# Patient Record
Sex: Female | Born: 1980
Health system: Southern US, Community
[De-identification: ages and names within clinical notes are randomized; demographics above are authoritative.]

## PROBLEM LIST (undated history)

## (undated) ENCOUNTER — Inpatient Hospital Stay (HOSPITAL_COMMUNITY): Payer: Self-pay

## (undated) DIAGNOSIS — K2970 Gastritis, unspecified, without bleeding: Secondary | ICD-10-CM

## (undated) DIAGNOSIS — B9681 Helicobacter pylori [H. pylori] as the cause of diseases classified elsewhere: Secondary | ICD-10-CM

## (undated) DIAGNOSIS — E079 Disorder of thyroid, unspecified: Secondary | ICD-10-CM

## (undated) DIAGNOSIS — F1020 Alcohol dependence, uncomplicated: Secondary | ICD-10-CM

## (undated) DIAGNOSIS — D649 Anemia, unspecified: Secondary | ICD-10-CM

## (undated) DIAGNOSIS — I10 Essential (primary) hypertension: Secondary | ICD-10-CM

## (undated) DIAGNOSIS — H5440 Blindness, one eye, unspecified eye: Secondary | ICD-10-CM

## (undated) DIAGNOSIS — D219 Benign neoplasm of connective and other soft tissue, unspecified: Secondary | ICD-10-CM

## (undated) DIAGNOSIS — F419 Anxiety disorder, unspecified: Secondary | ICD-10-CM

## (undated) DIAGNOSIS — H3320 Serous retinal detachment, unspecified eye: Secondary | ICD-10-CM

## (undated) DIAGNOSIS — F32A Depression, unspecified: Secondary | ICD-10-CM

## (undated) HISTORY — DX: Depression, unspecified: F32.A

## (undated) HISTORY — DX: Anxiety disorder, unspecified: F41.9

## (undated) HISTORY — DX: Alcohol dependence, uncomplicated: F10.20

## (undated) HISTORY — PX: DILATION AND CURETTAGE OF UTERUS: SHX78

## (undated) HISTORY — DX: Blindness, one eye, unspecified eye: H54.40

## (undated) HISTORY — PX: EYE SURGERY: SHX253

## (undated) HISTORY — DX: Helicobacter pylori (H. pylori) as the cause of diseases classified elsewhere: K29.70

## (undated) HISTORY — DX: Helicobacter pylori (H. pylori) as the cause of diseases classified elsewhere: B96.81

---

## 2005-04-18 ENCOUNTER — Emergency Department (HOSPITAL_COMMUNITY): Admission: EM | Admit: 2005-04-18 | Discharge: 2005-04-18 | Payer: Self-pay | Admitting: Emergency Medicine

## 2005-06-11 ENCOUNTER — Inpatient Hospital Stay (HOSPITAL_COMMUNITY): Admission: AD | Admit: 2005-06-11 | Discharge: 2005-06-11 | Payer: Self-pay | Admitting: Obstetrics and Gynecology

## 2005-10-15 ENCOUNTER — Inpatient Hospital Stay (HOSPITAL_COMMUNITY): Admission: AD | Admit: 2005-10-15 | Discharge: 2005-10-15 | Payer: Self-pay | Admitting: Obstetrics and Gynecology

## 2005-10-20 ENCOUNTER — Encounter (INDEPENDENT_AMBULATORY_CARE_PROVIDER_SITE_OTHER): Payer: Self-pay | Admitting: Specialist

## 2005-10-20 ENCOUNTER — Inpatient Hospital Stay (HOSPITAL_COMMUNITY): Admission: AD | Admit: 2005-10-20 | Discharge: 2005-10-22 | Payer: Self-pay | Admitting: Obstetrics and Gynecology

## 2007-01-29 ENCOUNTER — Emergency Department (HOSPITAL_COMMUNITY): Admission: EM | Admit: 2007-01-29 | Discharge: 2007-01-29 | Payer: Self-pay | Admitting: Family Medicine

## 2007-03-31 ENCOUNTER — Emergency Department (HOSPITAL_COMMUNITY): Admission: EM | Admit: 2007-03-31 | Discharge: 2007-03-31 | Payer: Self-pay | Admitting: Emergency Medicine

## 2007-04-05 ENCOUNTER — Emergency Department (HOSPITAL_COMMUNITY): Admission: EM | Admit: 2007-04-05 | Discharge: 2007-04-05 | Payer: Self-pay | Admitting: Emergency Medicine

## 2007-07-20 ENCOUNTER — Emergency Department (HOSPITAL_COMMUNITY): Admission: EM | Admit: 2007-07-20 | Discharge: 2007-07-20 | Payer: Self-pay | Admitting: Emergency Medicine

## 2008-05-12 ENCOUNTER — Emergency Department (HOSPITAL_COMMUNITY): Admission: EM | Admit: 2008-05-12 | Discharge: 2008-05-12 | Payer: Self-pay | Admitting: Emergency Medicine

## 2008-05-15 ENCOUNTER — Emergency Department (HOSPITAL_COMMUNITY): Admission: EM | Admit: 2008-05-15 | Discharge: 2008-05-15 | Payer: Self-pay | Admitting: Emergency Medicine

## 2008-08-24 ENCOUNTER — Emergency Department (HOSPITAL_COMMUNITY): Admission: EM | Admit: 2008-08-24 | Discharge: 2008-08-24 | Payer: Self-pay | Admitting: Emergency Medicine

## 2009-04-03 LAB — SICKLE CELL SCREEN: Sickle Cell Screen: NORMAL

## 2009-05-05 ENCOUNTER — Emergency Department (HOSPITAL_COMMUNITY): Admission: EM | Admit: 2009-05-05 | Discharge: 2009-05-05 | Payer: Self-pay | Admitting: Emergency Medicine

## 2009-08-21 ENCOUNTER — Emergency Department (HOSPITAL_COMMUNITY): Admission: EM | Admit: 2009-08-21 | Discharge: 2009-08-21 | Payer: Self-pay | Admitting: Emergency Medicine

## 2010-07-17 ENCOUNTER — Ambulatory Visit: Payer: Self-pay | Admitting: Obstetrics and Gynecology

## 2010-07-17 ENCOUNTER — Encounter (INDEPENDENT_AMBULATORY_CARE_PROVIDER_SITE_OTHER): Payer: Self-pay | Admitting: *Deleted

## 2010-07-18 ENCOUNTER — Encounter (INDEPENDENT_AMBULATORY_CARE_PROVIDER_SITE_OTHER): Payer: Self-pay | Admitting: *Deleted

## 2010-07-18 LAB — CONVERTED CEMR LAB

## 2010-07-23 ENCOUNTER — Ambulatory Visit: Payer: Self-pay | Admitting: Obstetrics and Gynecology

## 2010-10-05 ENCOUNTER — Encounter: Payer: Self-pay | Admitting: *Deleted

## 2010-11-25 ENCOUNTER — Inpatient Hospital Stay (INDEPENDENT_AMBULATORY_CARE_PROVIDER_SITE_OTHER)
Admission: RE | Admit: 2010-11-25 | Discharge: 2010-11-25 | Disposition: A | Discharge status: Home / Self Care | Payer: Self-pay | Source: Ambulatory Visit | Attending: Emergency Medicine | Admitting: Emergency Medicine

## 2010-11-25 DIAGNOSIS — A6004 Herpesviral vulvovaginitis: Secondary | ICD-10-CM

## 2010-12-17 LAB — BASIC METABOLIC PANEL
BUN: 10 mg/dL (ref 6–23)
CO2: 24 mEq/L (ref 19–32)
Chloride: 107 mEq/L (ref 96–112)
Glucose, Bld: 102 mg/dL — ABNORMAL HIGH (ref 70–99)
Potassium: 4.2 mEq/L (ref 3.5–5.1)
Sodium: 139 mEq/L (ref 135–145)

## 2010-12-17 LAB — RAPID URINE DRUG SCREEN, HOSP PERFORMED
Barbiturates: NOT DETECTED
Cocaine: NOT DETECTED
Opiates: NOT DETECTED
Tetrahydrocannabinol: POSITIVE — AB

## 2010-12-17 LAB — URINALYSIS, ROUTINE W REFLEX MICROSCOPIC
Glucose, UA: NEGATIVE mg/dL
Ketones, ur: NEGATIVE mg/dL
Specific Gravity, Urine: 1.022 (ref 1.005–1.030)
pH: 6.5 (ref 5.0–8.0)

## 2010-12-17 LAB — CBC
HCT: 38.1 % (ref 36.0–46.0)
Hemoglobin: 12.7 g/dL (ref 12.0–15.0)
MCHC: 33.3 g/dL (ref 30.0–36.0)
MCV: 85 fL (ref 78.0–100.0)
Platelets: 234 10*3/uL (ref 150–400)
RDW: 17.8 % — ABNORMAL HIGH (ref 11.5–15.5)

## 2010-12-17 LAB — DIFFERENTIAL
Basophils Absolute: 0 10*3/uL (ref 0.0–0.1)
Eosinophils Absolute: 0.1 10*3/uL (ref 0.0–0.7)
Eosinophils Relative: 2 % (ref 0–5)
Monocytes Absolute: 0.6 10*3/uL (ref 0.1–1.0)

## 2010-12-17 LAB — PREGNANCY, URINE: Preg Test, Ur: NEGATIVE
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
## 2011-02-18 ENCOUNTER — Emergency Department (HOSPITAL_COMMUNITY)
Admission: EM | Admit: 2011-02-18 | Discharge: 2011-02-19 | Disposition: A | Discharge status: Home / Self Care | Payer: Medicaid Other | Attending: Emergency Medicine | Admitting: Emergency Medicine

## 2011-02-18 ENCOUNTER — Emergency Department (HOSPITAL_COMMUNITY): Payer: Medicaid Other

## 2011-02-18 DIAGNOSIS — S62639B Displaced fracture of distal phalanx of unspecified finger, initial encounter for open fracture: Secondary | ICD-10-CM | POA: Insufficient documentation

## 2011-02-18 DIAGNOSIS — S61209A Unspecified open wound of unspecified finger without damage to nail, initial encounter: Secondary | ICD-10-CM | POA: Insufficient documentation

## 2011-02-18 DIAGNOSIS — W230XXA Caught, crushed, jammed, or pinched between moving objects, initial encounter: Secondary | ICD-10-CM | POA: Insufficient documentation

## 2011-03-03 NOTE — Op Note (Signed)
Susan, Walton NO.:  [id number]  MEDICAL RECORD NO.:  [id number]  LOCATION:  [facility]                         FACILITY:  [facility]  PHYSICIAN:  Dionne Ano. Galia Rahm, M.D.DATE OF BIRTH:  [birth year]  DATE OF PROCEDURE:  02/18/2011 DATE OF DISCHARGE:  02/19/2011                              OPERATIVE REPORT   HISTORY OF PRESENT ILLNESS:  Susan Walton presents to the emergency room.  I was asked to see her by the emergency room staff and take over her care due to a left thumb amputation.  The patient was at home tonight, shut her thumb accidently in a door.  This was a heavy screen door with metal impregnated into it.  She subsequently sustained a fracture and near amputation.  She is alert and oriented and pleasant.  PAST MEDICAL HISTORY:  Reviewed.  PAST SURGICAL HISTORY:  Reviewed.  She has no significant past medical or surgical problems.  She describes hypertension, but is not taking any medicines for this.  ALLERGIES:  ASPIRIN.  MEDICATIONS:  No current medicines that she takes on a regular basis.  SOCIAL HISTORY:  She occasionally enjoys an alcoholic beverage.  She is a mother of three and stays at home with her children.  FAMILY HISTORY:  Noncontributory.  REVIEW OF SYSTEMS:  Negative for fever, chills, nausea, vomiting, and malaise.  PHYSICAL EXAMINATION:  GENERAL:  Pleasant female, alert and not in any acute distress. VITAL SIGNS:  Stable. NECK/BACK:  Nontender. CHEST:  Clear. HEENT:  Within normal limits. EXTREMITIES:  The patient has a normal right upper extremity examination.  Lower extremity examination is benign.  Left upper extremity shows near amputation to the left thumb.  Index through small fingers looked normal.  Elbow and forearm are nontender.  She has no evidence of infection, dystrophy, or vascular compromise but certainly has findings of the near amputation to the thumb with associated fracture.  IMAGING:  The x-rays show a  fracture, comminuted in nature about the distal phalanx of thumb.  IMPRESSION:  Distal phalanx fracture with near amputation to the thumb.  PLAN:  I consented her verbally for I and D, repair of structures if necessary.  PROCEDURE IN DETAIL:  She was taken to the procedure suite and underwent intermetacarpal block performed by myself sterilely.  Following this, underwent I and D of skin, subcutaneous tissue, bone, tendinous tissue, and periosteal tissue.  Following the excisional debridement with orthopedic curettage instruments as well as knife blade and scissors, the patient then underwent copious irrigation followed by open treatment of the distal phalanx fracture.  Following this, the proximal portion of her hard nail and acrylic portion of her nail was removed to allow for repair.  Once this was done, I then performed a complex reattachment with volar flap.  This was done with combination of 6-0 an 4-0 chromic suture under 4.0 loupe magnification.  I was able to reattach the tip nicely and she had excellent refill, no complicating features.  Following this, the patient was irrigated copiously.  Adaptic was placed under the eponychial fold and I discussed with her the postoperative care plan.  Thus, the patient underwent I and D of skin,  subcutaneous tissue, muscle, tendon, open treatment of distal phalanx fracture, nail plate removal, complex nail bed repair and volar flap advancement.  She was discharged on Keflex 500 mg 1 p.o. q.i.d. x14 days.  I have encouraged her to take vitamin C 1000 mg day, over-the-counter stool softener to prevent constipation, and Percocet for pain 1-2 q.4-6 h. p.r.n. pain p.o., dispensed #45, 5 mg variety.  She will notify me should any problems occur.  We will see her back in 9-10 days in the office.  Dressing change, stack splint versus volar splint application, at 4 weeks we will begin range of motion, at 6-8 weeks sponge ball use. This is  going to take her up to 2-3 months to completely heal this and it will take her about 4-6 months until she completely grows a new nail to her satisfaction in my estimation.  She had some degree of nail ridging, however, she is certainly looking very good compared to where we started tonight.  It was a pleasure seeing her today.  We wish her best in the future and look forward to see her back in the office.     Dionne Ano. Amanda Pea, M.D.     [facility]  D:  02/18/2011  T:  02/19/2011  Job:  [id number]  Electronically Signed by Dominica Severin M.D. on 03/03/2011 06:33:10 AM

## 2011-05-16 ENCOUNTER — Emergency Department (HOSPITAL_COMMUNITY)
Admission: EM | Admit: 2011-05-16 | Discharge: 2011-05-16 | Disposition: A | Discharge status: Home / Self Care | Payer: Medicaid Other | Attending: Emergency Medicine | Admitting: Emergency Medicine

## 2011-05-16 ENCOUNTER — Emergency Department (HOSPITAL_COMMUNITY): Payer: Medicaid Other

## 2011-05-16 DIAGNOSIS — N949 Unspecified condition associated with female genital organs and menstrual cycle: Secondary | ICD-10-CM | POA: Insufficient documentation

## 2011-05-16 DIAGNOSIS — R220 Localized swelling, mass and lump, head: Secondary | ICD-10-CM | POA: Insufficient documentation

## 2011-05-16 DIAGNOSIS — O2390 Unspecified genitourinary tract infection in pregnancy, unspecified trimester: Secondary | ICD-10-CM | POA: Insufficient documentation

## 2011-05-16 DIAGNOSIS — R221 Localized swelling, mass and lump, neck: Secondary | ICD-10-CM | POA: Insufficient documentation

## 2011-05-16 DIAGNOSIS — A5901 Trichomonal vulvovaginitis: Secondary | ICD-10-CM | POA: Insufficient documentation

## 2011-05-16 DIAGNOSIS — O4690 Antepartum hemorrhage, unspecified, unspecified trimester: Secondary | ICD-10-CM | POA: Insufficient documentation

## 2011-05-16 DIAGNOSIS — B009 Herpesviral infection, unspecified: Secondary | ICD-10-CM | POA: Insufficient documentation

## 2011-05-16 DIAGNOSIS — R51 Headache: Secondary | ICD-10-CM | POA: Insufficient documentation

## 2011-05-16 LAB — WET PREP, GENITAL: Clue Cells Wet Prep HPF POC: NONE SEEN

## 2011-05-16 LAB — URINALYSIS, ROUTINE W REFLEX MICROSCOPIC
Ketones, ur: >80 mg/dL — AB
Nitrite: NEGATIVE
Protein, ur: 30 mg/dL — AB
Urobilinogen, UA: 1 mg/dL (ref 0.0–1.0)

## 2011-05-16 LAB — DIFFERENTIAL
Basophils Absolute: 0.1 10*3/uL (ref 0.0–0.1)
Basophils Relative: 1 % (ref 0–1)
Eosinophils Absolute: 0.2 10*3/uL (ref 0.0–0.7)
Monocytes Absolute: 0.5 10*3/uL (ref 0.1–1.0)
Monocytes Relative: 9 % (ref 3–12)
Neutro Abs: 2.9 10*3/uL (ref 1.7–7.7)
Neutrophils Relative %: 54 % (ref 43–77)

## 2011-05-16 LAB — POCT I-STAT, CHEM 8
BUN: 11 mg/dL (ref 6–23)
Calcium, Ion: 1.13 mmol/L (ref 1.12–1.32)
Hemoglobin: 13.3 g/dL (ref 12.0–15.0)
Sodium: 137 mEq/L (ref 135–145)
TCO2: 20 mmol/L (ref 0–100)

## 2011-05-16 LAB — CBC
Hemoglobin: 12 g/dL (ref 12.0–15.0)
MCH: 28.3 pg (ref 26.0–34.0)
MCHC: 33.9 g/dL (ref 30.0–36.0)
Platelets: 240 10*3/uL (ref 150–400)
RBC: 4.24 MIL/uL (ref 3.87–5.11)

## 2011-05-16 LAB — ABO/RH: ABO/RH(D): B POS

## 2011-05-16 LAB — HCG, QUANTITATIVE, PREGNANCY: hCG, Beta Chain, Quant, S: 5988 m[IU]/mL — ABNORMAL HIGH (ref <5)

## 2011-05-16 LAB — RPR: RPR Ser Ql: NONREACTIVE

## 2011-05-17 ENCOUNTER — Inpatient Hospital Stay (HOSPITAL_COMMUNITY)
Admission: AD | Admit: 2011-05-17 | Discharge: 2011-05-17 | Disposition: A | Discharge status: Home / Self Care | Payer: Medicaid Other | Source: Ambulatory Visit | Attending: Obstetrics & Gynecology | Admitting: Obstetrics & Gynecology

## 2011-05-17 ENCOUNTER — Encounter (HOSPITAL_COMMUNITY): Payer: Self-pay

## 2011-05-17 DIAGNOSIS — O169 Unspecified maternal hypertension, unspecified trimester: Secondary | ICD-10-CM | POA: Insufficient documentation

## 2011-05-17 DIAGNOSIS — O9989 Other specified diseases and conditions complicating pregnancy, childbirth and the puerperium: Secondary | ICD-10-CM | POA: Insufficient documentation

## 2011-05-17 DIAGNOSIS — M549 Dorsalgia, unspecified: Secondary | ICD-10-CM | POA: Insufficient documentation

## 2011-05-17 DIAGNOSIS — O200 Threatened abortion: Secondary | ICD-10-CM

## 2011-05-17 DIAGNOSIS — M899 Disorder of bone, unspecified: Secondary | ICD-10-CM | POA: Insufficient documentation

## 2011-05-17 DIAGNOSIS — R109 Unspecified abdominal pain: Secondary | ICD-10-CM | POA: Insufficient documentation

## 2011-05-17 DIAGNOSIS — M259 Joint disorder, unspecified: Secondary | ICD-10-CM | POA: Insufficient documentation

## 2011-05-17 HISTORY — DX: Essential (primary) hypertension: I10

## 2011-05-17 LAB — GC/CHLAMYDIA PROBE AMP, GENITAL
Chlamydia, DNA Probe: NEGATIVE
GC Probe Amp, Genital: NEGATIVE

## 2011-05-17 NOTE — Progress Notes (Signed)
Notified Child psychotherapist . Will come see pt in MAU
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
## 2011-05-17 NOTE — ED Notes (Signed)
Social worker at bedside to eval pt.

## 2011-05-17 NOTE — Progress Notes (Signed)
Was seen at [facility] ED yesterday was told her uterus was abnormal on ultrasound having intermittent abdominal pain, was having light spotting one time, none today

## 2011-05-17 NOTE — ED Provider Notes (Signed)
Attestation of Attending Supervision of Advanced Practitioner: Evaluation and management procedures were performed by the PA/NP/CNM/OB Fellow under my supervision/collaboration. Chart reviewed and agree with management and plan.  ANYANWU,UGONNA A 05/17/2011 11:58 AM

## 2011-05-17 NOTE — ED Notes (Signed)
Social worker trying to arrange bus fare out of town for pt and 2 children to stay with her mother.
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
## 2011-05-21 ENCOUNTER — Other Ambulatory Visit: Payer: Self-pay | Admitting: Obstetrics & Gynecology

## 2011-05-21 ENCOUNTER — Inpatient Hospital Stay (HOSPITAL_COMMUNITY)
Admission: AD | Admit: 2011-05-21 | Discharge: 2011-05-21 | Disposition: A | Discharge status: Home / Self Care | Payer: Medicaid Other | Source: Ambulatory Visit | Attending: Obstetrics & Gynecology | Admitting: Obstetrics & Gynecology

## 2011-05-21 ENCOUNTER — Inpatient Hospital Stay (HOSPITAL_COMMUNITY): Payer: Medicaid Other

## 2011-05-21 DIAGNOSIS — O10019 Pre-existing essential hypertension complicating pregnancy, unspecified trimester: Secondary | ICD-10-CM | POA: Insufficient documentation

## 2011-05-21 DIAGNOSIS — O98319 Other infections with a predominantly sexual mode of transmission complicating pregnancy, unspecified trimester: Secondary | ICD-10-CM | POA: Insufficient documentation

## 2011-05-21 DIAGNOSIS — O039 Complete or unspecified spontaneous abortion without complication: Secondary | ICD-10-CM

## 2011-05-21 DIAGNOSIS — N771 Vaginitis, vulvitis and vulvovaginitis in diseases classified elsewhere: Secondary | ICD-10-CM | POA: Insufficient documentation

## 2011-05-21 DIAGNOSIS — A5609 Other chlamydial infection of lower genitourinary tract: Secondary | ICD-10-CM | POA: Insufficient documentation

## 2011-05-21 DIAGNOSIS — A5901 Trichomonal vulvovaginitis: Secondary | ICD-10-CM

## 2011-05-21 LAB — CBC
HCT: 34.5 % — ABNORMAL LOW (ref 36.0–46.0)
Hemoglobin: 11.4 g/dL — ABNORMAL LOW (ref 12.0–15.0)
RBC: 4.03 MIL/uL (ref 3.87–5.11)

## 2011-05-21 MED ORDER — METRONIDAZOLE 500 MG PO TABS: 500.0000 mg | ORAL_TABLET | Freq: Two times a day (BID) | ORAL | Status: AC

## 2011-05-21 NOTE — Progress Notes (Signed)
Pt states she had heavy bleeding that started at 1700-was passing clots and states she soaked 3 pads in 2 hours

## 2011-05-21 NOTE — ED Provider Notes (Signed)
History     Chief Complaint  Patient presents with  . Vaginal Bleeding   The history is provided by the patient.  Susan Walton y.o.  Presents with bleeding in a known pregnancy.  LMP 6/18.  Presented to [facility] 8/31 for spotting and cramping.  U/S told the "uterus was shaped differently".   Per report the gestational sac was elongated measuring [redacted]w[redacted]d without cardiac activity. Suspicious for anembryonic pregnancy.    BHCG that date was 5988, blood type B+.  Planned pregnancy.  Today she reports at 1700 she had lower abdominal and lower back pain and passed 1 blood clot with heavy bleeding and 45 minutes later passed another clot.  Since that time less cramping now "fading away" and bleeding is less.  States she has high blood pressure and hasn';t been taking her medication.  States she was dx and given Rx for Trich at [facility] but lost prescription and would like another Rx.  No longer with that partner.  Voices no other concerns.      Past Medical History  Diagnosis Date  . Hypertension     Past Surgical History  Procedure Date  . No past surgeries     No family history on file.  History  Substance Use Topics  . Smoking status: Not on file  . Smokeless tobacco: Not on file  . Alcohol Use: Not on file    Allergies:  Allergies  Allergen Reactions  . Asa Buff (Mag (Aspirin Buffered) Hives and Swelling    Prescriptions prior to admission  Medication Sig Dispense Refill  . hydrochlorothiazide 25 MG tablet Take 25 mg by mouth daily.        Marland Kitchen PRESCRIPTION MEDICATION Take 1 tablet by mouth daily. Pt is prescribed a med for hypertension but she has not taken in over 4 months.         Review of Systems  Constitutional: Negative for fever and chills.  Respiratory: Negative.   Gastrointestinal: Positive for abdominal pain. Negative for nausea and vomiting.  Genitourinary: Negative.        Vaginal bleeding and lower abdominal cramping.   Physical Exam   Blood pressure 131/83, pulse 74,  temperature 98.3 F (36.8 C), temperature source Oral, resp. rate 20, height 5\' 6"  (1.676 Walton), weight 170 lb (77.111 kg), last menstrual period 03/03/2011.  Physical Exam  Constitutional: She is oriented to person, place, and time. She appears well-developed and well-nourished.  HENT:  Head: Normocephalic.  Neck: Normal range of motion.  Respiratory: Effort normal.  GI: Soft.  Genitourinary: Uterus is enlarged and tender. Right adnexum displays no tenderness. Left adnexum displays no tenderness. There is bleeding (moderate amount of bleeding with mulitple clots.  Large gestational sac lying in the vaginal canal. Easily removed. l) around the vagina.  Neurological: She is alert and oriented to person, place, and time.  Skin: Skin is warm and dry.    MAU Course  Procedures Results for orders placed during the hospital encounter of 05/21/11 (from the past 24 hour(s))  CBC     Status: Abnormal   Collection Time   05/21/11  8:54 PM      Component Value Range   WBC 7.5  4.0 - 10.5 (K/uL)   RBC 4.03  3.87 - 5.11 (MIL/uL)   Hemoglobin 11.4 (*) 12.0 - 15.0 (g/dL)   HCT 16.1 (*) 09.6 - 46.0 (%)   MCV 85.6  78.0 - 100.0 (fL)   MCH 28.3  26.0 - 34.0 (pg)  MCHC 33.0  30.0 - 36.0 (g/dL)   RDW 16.1 (*) 09.6 - 15.5 (%)   Platelets 233  150 - 400 (K/uL)   Ultrasound report:  No intrauterine gestational sac or embryo seen.  Thickened inhomogeneous endometrial contents 2.2cm in thickness.  Findings consistent with retained products of conception.  MDM Discussed findings with Dr. Despina Hidden.  POC sent to lab. Follow up in GYN in 4 weeks. Assessment and Plan  Spontaneous Abortion complete Untreated Trichomonas  Plan  Followup in the GYN CLInic in 4 weeks per Dr. Despina Hidden.  Instructed to avoid intercourse while she is bleeding and until seen in the clinic. Rx given for Flagyl. Susan Walton,Susan Walton 05/21/2011, 8:05 PM   Susan Holmes, NP 05/21/11 2250

## 2011-05-22 ENCOUNTER — Ambulatory Visit (HOSPITAL_COMMUNITY): Payer: Medicaid Other

## 2011-06-11 ENCOUNTER — Ambulatory Visit: Payer: Medicaid Other | Admitting: Audiology

## 2011-06-19 ENCOUNTER — Encounter: Payer: Medicaid Other | Admitting: Obstetrics and Gynecology

## 2011-06-20 LAB — WET PREP, GENITAL

## 2011-06-20 LAB — URINALYSIS, ROUTINE W REFLEX MICROSCOPIC
Glucose, UA: NEGATIVE mg/dL
Hgb urine dipstick: NEGATIVE
Protein, ur: NEGATIVE mg/dL
Specific Gravity, Urine: 1.034 — ABNORMAL HIGH (ref 1.005–1.030)
pH: 6 (ref 5.0–8.0)

## 2011-06-20 LAB — URINE CULTURE: Colony Count: >=100000

## 2011-06-20 LAB — GC/CHLAMYDIA PROBE AMP, GENITAL: Chlamydia, DNA Probe: NEGATIVE

## 2011-06-30 LAB — URINALYSIS, ROUTINE W REFLEX MICROSCOPIC
Bilirubin Urine: NEGATIVE
Nitrite: NEGATIVE
Specific Gravity, Urine: 1.028
Urobilinogen, UA: 0.2

## 2011-06-30 LAB — WET PREP, GENITAL
Clue Cells Wet Prep HPF POC: NONE SEEN
Yeast Wet Prep HPF POC: NONE SEEN

## 2011-06-30 LAB — RPR: RPR Ser Ql: NONREACTIVE

## 2011-06-30 LAB — POCT PREGNANCY, URINE: Operator id: 198171

## 2011-06-30 LAB — URINE MICROSCOPIC-ADD ON

## 2011-10-02 ENCOUNTER — Emergency Department (HOSPITAL_COMMUNITY)
Admission: EM | Admit: 2011-10-02 | Discharge: 2011-10-02 | Disposition: A | Discharge status: Home / Self Care | Payer: Medicaid Other | Attending: Emergency Medicine | Admitting: Emergency Medicine

## 2011-10-02 ENCOUNTER — Encounter (HOSPITAL_COMMUNITY): Payer: Self-pay

## 2011-10-02 ENCOUNTER — Emergency Department (HOSPITAL_COMMUNITY): Payer: Medicaid Other

## 2011-10-02 DIAGNOSIS — IMO0002 Reserved for concepts with insufficient information to code with codable children: Secondary | ICD-10-CM | POA: Insufficient documentation

## 2011-10-02 DIAGNOSIS — R10819 Abdominal tenderness, unspecified site: Secondary | ICD-10-CM | POA: Insufficient documentation

## 2011-10-02 DIAGNOSIS — R109 Unspecified abdominal pain: Secondary | ICD-10-CM | POA: Insufficient documentation

## 2011-10-02 DIAGNOSIS — R300 Dysuria: Secondary | ICD-10-CM | POA: Insufficient documentation

## 2011-10-02 DIAGNOSIS — N949 Unspecified condition associated with female genital organs and menstrual cycle: Secondary | ICD-10-CM | POA: Insufficient documentation

## 2011-10-02 DIAGNOSIS — N898 Other specified noninflammatory disorders of vagina: Secondary | ICD-10-CM | POA: Insufficient documentation

## 2011-10-02 DIAGNOSIS — L293 Anogenital pruritus, unspecified: Secondary | ICD-10-CM | POA: Insufficient documentation

## 2011-10-02 DIAGNOSIS — R3911 Hesitancy of micturition: Secondary | ICD-10-CM | POA: Insufficient documentation

## 2011-10-02 DIAGNOSIS — R3915 Urgency of urination: Secondary | ICD-10-CM | POA: Insufficient documentation

## 2011-10-02 DIAGNOSIS — F17200 Nicotine dependence, unspecified, uncomplicated: Secondary | ICD-10-CM | POA: Insufficient documentation

## 2011-10-02 DIAGNOSIS — R102 Pelvic and perineal pain: Secondary | ICD-10-CM

## 2011-10-02 LAB — BASIC METABOLIC PANEL
BUN: 13 mg/dL (ref 6–23)
CO2: 23 mEq/L (ref 19–32)
Calcium: 9.6 mg/dL (ref 8.4–10.5)
Chloride: 108 mEq/L (ref 96–112)
Creatinine, Ser: 0.66 mg/dL (ref 0.50–1.10)
GFR calc Af Amer: >90 mL/min (ref >90)
GFR calc non Af Amer: >90 mL/min (ref >90)
Glucose, Bld: 87 mg/dL (ref 70–99)
Potassium: 3.9 mEq/L (ref 3.5–5.1)
Sodium: 141 mEq/L (ref 135–145)

## 2011-10-02 LAB — CBC
HCT: 36.3 % (ref 36.0–46.0)
Hemoglobin: 11.7 g/dL — ABNORMAL LOW (ref 12.0–15.0)
MCH: 26.4 pg (ref 26.0–34.0)
MCHC: 32.2 g/dL (ref 30.0–36.0)
MCV: 81.8 fL (ref 78.0–100.0)
Platelets: 336 10*3/uL (ref 150–400)
RBC: 4.44 MIL/uL (ref 3.87–5.11)
RDW: 16.7 % — ABNORMAL HIGH (ref 11.5–15.5)
WBC: 7.4 10*3/uL (ref 4.0–10.5)

## 2011-10-02 LAB — DIFFERENTIAL
Basophils Absolute: 0 10*3/uL (ref 0.0–0.1)
Basophils Relative: 1 % (ref 0–1)
Eosinophils Absolute: 0.2 10*3/uL (ref 0.0–0.7)
Eosinophils Relative: 3 % (ref 0–5)
Lymphocytes Relative: 26 % (ref 12–46)
Lymphs Abs: 1.9 10*3/uL (ref 0.7–4.0)
Monocytes Absolute: 0.6 10*3/uL (ref 0.1–1.0)
Monocytes Relative: 8 % (ref 3–12)
Neutro Abs: 4.7 10*3/uL (ref 1.7–7.7)
Neutrophils Relative %: 64 % (ref 43–77)

## 2011-10-02 LAB — URINALYSIS, ROUTINE W REFLEX MICROSCOPIC
Ketones, ur: 15 mg/dL — AB
Nitrite: NEGATIVE
Protein, ur: NEGATIVE mg/dL

## 2011-10-02 LAB — URINE MICROSCOPIC-ADD ON

## 2011-10-02 LAB — WET PREP, GENITAL
Trich, Wet Prep: NONE SEEN
Yeast Wet Prep HPF POC: NONE SEEN

## 2011-10-02 MED ORDER — HYDROCODONE-ACETAMINOPHEN 5-325 MG PO TABS: 1.0000 | ORAL_TABLET | Freq: Four times a day (QID) | ORAL | Status: AC | PRN

## 2011-10-02 MED ORDER — ONDANSETRON HCL 4 MG/2ML IJ SOLN
4.0000 mg | Freq: Once | INTRAMUSCULAR | Status: AC
  Administered 2011-10-02: 4 mg via INTRAVENOUS
  Filled 2011-10-02: qty 2

## 2011-10-02 MED ORDER — FENTANYL CITRATE 0.05 MG/ML IJ SOLN
50.0000 ug | Freq: Once | INTRAMUSCULAR | Status: AC
  Administered 2011-10-02: 50 ug via INTRAVENOUS
  Filled 2011-10-02: qty 2

## 2011-10-02 MED ORDER — CEFTRIAXONE SODIUM 250 MG IJ SOLR
250.0000 mg | Freq: Once | INTRAMUSCULAR | Status: AC
  Administered 2011-10-02: 250 mg via INTRAMUSCULAR
  Filled 2011-10-02: qty 250

## 2011-10-02 MED ORDER — AZITHROMYCIN 250 MG PO TABS
1000.0000 mg | ORAL_TABLET | Freq: Once | ORAL | Status: AC
  Administered 2011-10-02: 1000 mg via ORAL
  Filled 2011-10-02: qty 4

## 2011-10-02 MED ORDER — DOXYCYCLINE HYCLATE 100 MG PO CAPS: 100.0000 mg | ORAL_CAPSULE | Freq: Two times a day (BID) | ORAL | Status: AC

## 2011-10-02 NOTE — Progress Notes (Signed)
Per nursing request, I left a voicemail for SW to come see patient.

## 2011-10-02 NOTE — ED Notes (Signed)
POCT urine pregnancy performed; resulted NEGATIVE 
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
## 2011-10-02 NOTE — ED Notes (Signed)
Pt presents with complaints of nausea, abdominal pain, lower abdominal pain, vaginal discharge, and uti s/s. Alert and oriented. Pt states that she also is needing some referrals to speak with a counselor about stressful situations that she is currently dealing with.

## 2011-10-02 NOTE — ED Notes (Signed)
Patient states that for the past 4 days she has been having lower abdominal pain. She states that the pain is radiating around into her back. She states that she has also been very nauseated. Pt states that her period this month lasted longer than normal. She was pregnant in sept and had a miscarriage which required a d and c. Pt states that after that procedure she has not had any other follow up. She states that she has now developed a vaginal discharge which she describes as thick and white in nature. Pt states that she is having vaginal discomfort with associated redness, itching, irritation, pain during intercourse, and pain with urination. She states that her last sexual intercourse was last night and was "rough, and lasted 2 hours". Pt is alert and oriented. Breath sounds are clear and bowel sounds are present. Iv started and protocols initiated. Pt states that the pain is worse in her stomach with movement, palpation, turning, and coughing.
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
## 2011-10-03 NOTE — ED Provider Notes (Signed)
Medical screening examination/treatment/procedure(s) were performed by non-physician practitioner and as supervising physician I was immediately available for consultation/collaboration.  Geoffery Lyons, MD 10/03/11 [phone]

## 2011-10-04 LAB — GC/CHLAMYDIA PROBE AMP, GENITAL: Chlamydia, DNA Probe: POSITIVE — AB

## 2011-10-06 NOTE — ED Notes (Signed)
+   Chlamydia Patient treated with Rocephin and Zithromax also given Rx for Doxycycline,DHHS letter faxed.

## 2014-07-17 ENCOUNTER — Encounter (HOSPITAL_COMMUNITY): Payer: Self-pay

## 2014-08-19 DIAGNOSIS — O4400 Placenta previa specified as without hemorrhage, unspecified trimester: Secondary | ICD-10-CM

## 2015-05-22 ENCOUNTER — Emergency Department (HOSPITAL_COMMUNITY)
Admission: EM | Admit: 2015-05-22 | Discharge: 2015-05-22 | Disposition: A | Discharge status: Home / Self Care | Payer: Medicaid Other | Attending: Emergency Medicine | Admitting: Emergency Medicine

## 2015-05-22 ENCOUNTER — Encounter (HOSPITAL_COMMUNITY): Payer: Self-pay | Admitting: Emergency Medicine

## 2015-05-22 DIAGNOSIS — Z720 Tobacco use: Secondary | ICD-10-CM | POA: Diagnosis not present

## 2015-05-22 DIAGNOSIS — J029 Acute pharyngitis, unspecified: Secondary | ICD-10-CM | POA: Diagnosis present

## 2015-05-22 DIAGNOSIS — J020 Streptococcal pharyngitis: Secondary | ICD-10-CM | POA: Insufficient documentation

## 2015-05-22 DIAGNOSIS — I10 Essential (primary) hypertension: Secondary | ICD-10-CM | POA: Insufficient documentation

## 2015-05-22 LAB — RAPID STREP SCREEN (MED CTR MEBANE ONLY): STREPTOCOCCUS, GROUP A SCREEN (DIRECT): POSITIVE — AB

## 2015-05-22 MED ORDER — ACETAMINOPHEN 325 MG PO TABS
650.0000 mg | ORAL_TABLET | Freq: Once | ORAL | Status: AC
  Administered 2015-05-22: 650 mg via ORAL
  Filled 2015-05-22: qty 2

## 2015-05-22 MED ORDER — IBUPROFEN 600 MG PO TABS: 600.0000 mg | ORAL_TABLET | Freq: Four times a day (QID) | ORAL | Status: DC | PRN

## 2015-05-22 MED ORDER — PENICILLIN G BENZATHINE 1200000 UNIT/2ML IM SUSP
1.2000 10*6.[IU] | Freq: Once | INTRAMUSCULAR | Status: AC
  Administered 2015-05-22: 1.2 10*6.[IU] via INTRAMUSCULAR
  Filled 2015-05-22: qty 2

## 2015-05-22 NOTE — ED Provider Notes (Signed)
CSN: [id number]     Arrival date & time 05/22/15  1236 History  This chart was scribed for non-physician practitioner, Renold Genta, PA-C, working with Orpah Greek, MD, by Stephania Fragmin, ED Scribe. This patient was seen in room TR02C/TR02C and the patient's care was started at 2:34 PM.    Chief Complaint  Patient presents with  . Sore Throat   The history is provided by the patient. No language interpreter was used.   HPI Comments: Susan Walton is a 34 y.o. female who presents to the Emergency Department complaining of a gradual-onset, constant, worsening sore throat that began when she woke up 3 days ago. She also complains of an associated headache, dry cough, and generalized myalgias. She had taken Aleve and aspirin, which alleviated her generalized myalgias; patient's last dose was at 4 AM. Patient reports her two daughters had the same symptoms 1 month ago, and her nephew is also developing the same symptoms.   Past Medical History  Diagnosis Date  . Hypertension    Past Surgical History  Procedure Laterality Date  . No past surgeries    . Dilation and curettage of uterus     History reviewed. No pertinent family history. Social History  Substance Use Topics  . Smoking status: Current Every Day Smoker  . Smokeless tobacco: None  . Alcohol Use: Yes   OB History    Gravida Para Term Preterm AB TAB SAB Ectopic Multiple Living   5 3 3  0 1 1  0 0 3     Review of Systems  HENT: Positive for sore throat.   Respiratory: Positive for cough.   Musculoskeletal: Positive for myalgias (generalized).  Neurological: Positive for headaches.    Allergies  Asa buff (mag  Home Medications   Prior to Admission medications   Not on File   BP 161/107 mmHg  Pulse 85  Temp(Src) 100.1 F (37.8 C) (Oral)  Resp 16  Ht 5\' 8"  (1.727 m)  Wt 170 lb (77.111 kg)  BMI 25.85 kg/m2  SpO2 99% Physical Exam  Constitutional: She is oriented to person, place, and time. She appears  well-developed and well-nourished. No distress.  HENT:  Head: Normocephalic and atraumatic.  Right Ear: Tympanic membrane, external ear and ear canal normal.  Left Ear: Tympanic membrane, external ear and ear canal normal.  Nose: Nose normal.  Mouth/Throat: Uvula is midline and mucous membranes are normal. Oropharyngeal exudate and posterior oropharyngeal erythema present. No tonsillar abscesses.  Eyes: Conjunctivae and EOM are normal.  Neck: Neck supple. No tracheal deviation present.  Cardiovascular: Normal rate, regular rhythm and normal heart sounds.   Pulmonary/Chest: Effort normal. No respiratory distress. She has no wheezes. She has no rales.  Musculoskeletal: Normal range of motion.  Neurological: She is alert and oriented to person, place, and time.  Skin: Skin is warm and dry.  Psychiatric: She has a normal mood and affect. Her behavior is normal.  Nursing note and vitals reviewed.   ED Course  Procedures (including critical care time)  DIAGNOSTIC STUDIES: Oxygen Saturation is 99% on RA, normal by my interpretation.    COORDINATION OF CARE: 2:36 PM - Pt made aware of positive strep screen. Discussed treatment plan with pt at bedside which includes penicillin injection administered here, Tylenol/Motrin for fever, saltwater gargles, rest, and fluids. Pt verbalized understanding and agreed to plan.   Labs Review Labs Reviewed  RAPID STREP SCREEN (NOT AT [facility]) - Abnormal; Notable for the following:  Streptococcus, Group A Screen (Direct) POSITIVE (*)    All other components within normal limits    MDM   Final diagnoses:  Strep pharyngitis   Patient with sore throat for 3 days, recently exposed to strep. On exam her pharynx is erythematous with some exudate present. Uvula is midline. No evidence peritonsillar retropharyngeal abscess. Temperature is 100.1 here, will give some Tylenol. Rapid strep is positive. Patient wants to be treated with IM penicillin. Plan to  discharge home, Tylenol, Motrin for fever and aim. Oral fluids. Rest. Follow-up as needed.  Filed Vitals:   05/22/15 1304  BP: 161/107  Pulse: 85  Temp: 100.1 F (37.8 C)  TempSrc: Oral  Resp: 16  Height: 5\' 8"  (1.727 m)  Weight: 170 lb (77.111 kg)  SpO2: 99%    I personally performed the services described in this documentation, which was scribed in my presence. The recorded information has been reviewed and is accurate.   Jeannett Senior, PA-C 05/22/15 [place], MD 05/23/15 [phone]

## 2015-05-22 NOTE — ED Notes (Signed)
Called for triage x3. No answer 

## 2015-05-22 NOTE — ED Notes (Signed)
Pt sts body aches and sore throat x 3 days

## 2015-05-22 NOTE — Discharge Instructions (Signed)
Take ibuprofen and Tylenol for pain and fever. Salt water gargles multiple times a day. Please return or follow-up with your doctor for symptoms of worsening.  Strep Throat Strep throat is an infection of the throat caused by a bacteria named Streptococcus pyogenes. Your health care provider may call the infection streptococcal "tonsillitis" or "pharyngitis" depending on whether there are signs of inflammation in the tonsils or back of the throat. Strep throat is most common in children aged 5-15 years during the cold months of the year, but it can occur in people of any age during any season. This infection is spread from person to person (contagious) through coughing, sneezing, or other close contact. SIGNS AND SYMPTOMS   Fever or chills.  Painful, swollen, red tonsils or throat.  Pain or difficulty when swallowing.  White or yellow spots on the tonsils or throat.  Swollen, tender lymph nodes or "glands" of the neck or under the jaw.  Red rash all over the body (rare). DIAGNOSIS  Many different infections can cause the same symptoms. A test must be done to confirm the diagnosis so the right treatment can be given. A "rapid strep test" can help your health care provider make the diagnosis in a few minutes. If this test is not available, a light swab of the infected area can be used for a throat culture test. If a throat culture test is done, results are usually available in a day or two. TREATMENT  Strep throat is treated with antibiotic medicine. HOME CARE INSTRUCTIONS   Gargle with 1 tsp of salt in 1 cup of warm water, 3-4 times per day or as needed for comfort.  Family members who also have a sore throat or fever should be tested for strep throat and treated with antibiotics if they have the strep infection.  Make sure everyone in your household washes their hands well.  Do not share food, drinking cups, or personal items that could cause the infection to spread to others.  You may  need to eat a soft food diet until your sore throat gets better.  Drink enough water and fluids to keep your urine clear or pale yellow. This will help prevent dehydration.  Get plenty of rest.  Stay home from school, day care, or work until you have been on antibiotics for 24 hours.  Take medicines only as directed by your health care provider.  Take your antibiotic medicine as directed by your health care provider. Finish it even if you start to feel better. SEEK MEDICAL CARE IF:   The glands in your neck continue to enlarge.  You develop a rash, cough, or earache.  You cough up green, yellow-brown, or bloody sputum.  You have pain or discomfort not controlled by medicines.  Your problems seem to be getting worse rather than better.  You have a fever. SEEK IMMEDIATE MEDICAL CARE IF:   You develop any new symptoms such as vomiting, severe headache, stiff or painful neck, chest pain, shortness of breath, or trouble swallowing.  You develop severe throat pain, drooling, or changes in your voice.  You develop swelling of the neck, or the skin on the neck becomes red and tender.  You develop signs of dehydration, such as fatigue, dry mouth, and decreased urination.  You become increasingly sleepy, or you cannot wake up completely. MAKE SURE YOU:  Understand these instructions.  Will watch your condition.  Will get help right away if you are not doing well or get worse.  Document Released: 08/29/2000 Document Revised: 01/16/2014 Document Reviewed: 10/31/2010 °ExitCare® Patient Information ©2015 [employer]. This information is not intended to replace advice given to you by your health care provider. Make sure you discuss any questions you have with your health care provider. ° °

## 2016-06-06 ENCOUNTER — Ambulatory Visit (HOSPITAL_COMMUNITY)
Admission: EM | Admit: 2016-06-06 | Discharge: 2016-06-06 | Disposition: A | Discharge status: Home / Self Care | Payer: Medicaid Other | Attending: Physician Assistant | Admitting: Physician Assistant

## 2016-06-06 ENCOUNTER — Encounter (HOSPITAL_COMMUNITY): Payer: Self-pay | Admitting: Emergency Medicine

## 2016-06-06 DIAGNOSIS — J029 Acute pharyngitis, unspecified: Secondary | ICD-10-CM | POA: Diagnosis present

## 2016-06-06 LAB — POCT RAPID STREP A: Streptococcus, Group A Screen (Direct): NEGATIVE

## 2016-06-06 MED ORDER — AMOXICILLIN 500 MG PO CAPS: 500.0000 mg | ORAL_CAPSULE | Freq: Three times a day (TID) | ORAL | 0 refills | Status: DC

## 2016-06-06 NOTE — ED Provider Notes (Signed)
CSN: [id number]     Arrival date & time 06/06/16  1053 History   First MD Initiated Contact with Patient 06/06/16 1159     Chief Complaint  Patient presents with  . Sore Throat   (Consider location/radiation/quality/duration/timing/severity/associated sxs/prior Treatment) HPI History obtained from patient:  Pt presents with the cc of:  Sore throat Duration of symptoms: 3 days Treatment prior to arrival: Saltwater gargles, use Chloraseptic honey lemon tea none of this helping Context: Onset of pain in her throat 3 days ago. Extremely scratchy Other symptoms include: Painful swallowing Pain score: 3 FAMILY HISTORY: Hypertension    Past Medical History:  Diagnosis Date  . Hypertension    Past Surgical History:  Procedure Laterality Date  . DILATION AND CURETTAGE OF UTERUS    . NO PAST SURGERIES     History reviewed. No pertinent family history. Social History  Substance Use Topics  . Smoking status: Current Every Day Smoker  . Smokeless tobacco: Never Used  . Alcohol use Yes   OB History    Gravida Para Term Preterm AB Living   5 3 3  0 1 3   SAB TAB Ectopic Multiple Live Births     1 0 0       Review of Systems  Denies: HEADACHE, NAUSEA, ABDOMINAL PAIN, CHEST PAIN, CONGESTION, DYSURIA, SHORTNESS OF BREATH  Allergies  Asa buff (mag [buffered aspirin]  Home Medications   Prior to Admission medications   Medication Sig Start Date End Date Taking? Authorizing Provider  amoxicillin (AMOXIL) 500 MG capsule Take 1 capsule (500 mg total) by mouth 3 (three) times daily. 06/06/16   Konrad Felix, PA  ibuprofen (ADVIL,MOTRIN) 600 MG tablet Take 1 tablet (600 mg total) by mouth every 6 (six) hours as needed. 05/22/15   Jeannett Senior, PA-C   Meds Ordered and Administered this Visit  Medications - No data to display  BP 162/85 (BP Location: Left Arm)   Pulse 69   Temp 98.3 F (36.8 C) (Oral)   Resp 12   LMP 05/19/2016   SpO2 100%  No data found.   Physical  Exam NURSES NOTES AND VITAL SIGNS REVIEWED. CONSTITUTIONAL: Well developed, well nourished, no acute distress HEENT: normocephalic, atraumatic, posterior pharynx is injected injected without exudate, there are no petechiae noted. EYES: Conjunctiva normal NECK:normal ROM, supple, no adenopathy PULMONARY:No respiratory distress, normal effort ABDOMINAL: Soft, ND, NT BS+, No CVAT MUSCULOSKELETAL: Normal ROM of all extremities,  SKIN: warm and dry without rash PSYCHIATRIC: Mood and affect, behavior are normal  Urgent Care Course   Clinical Course    Procedures (including critical care time)  Labs Review Labs Reviewed  CULTURE, GROUP A STREP [facility])  POCT RAPID STREP A    Imaging Review No results found.   Visual Acuity Review  Right Eye Distance:   Left Eye Distance:   Bilateral Distance:    Right Eye Near:   Left Eye Near:    Bilateral Near:         MDM   1. Pharyngitis     Patient is reassured that there are no issues that require transfer to higher level of care at this time or additional tests. Patient is advised to continue home symptomatic treatment. Patient is advised that if there are new or worsening symptoms to attend the emergency department, contact primary care provider, or return to UC. Instructions of care provided discharged home in stable condition.    THIS NOTE WAS GENERATED USING A VOICE RECOGNITION SOFTWARE  PROGRAM. ALL REASONABLE EFFORTS  WERE MADE TO PROOFREAD THIS DOCUMENT FOR ACCURACY.  I have verbally reviewed the discharge instructions with the patient. A printed AVS was given to the patient.  All questions were answered prior to discharge.      Konrad Felix, [place] 06/06/16 1945

## 2016-06-06 NOTE — ED Triage Notes (Signed)
C/o ST onset 4 days associated w/HA, odynophagia and hot flashes  Reports she's taking Flagyl for trich  A&O x4... NAD

## 2016-06-06 NOTE — Discharge Instructions (Signed)
Push fluids, salt water gargles

## 2016-06-09 LAB — CULTURE, GROUP A STREP (THRC)

## 2016-09-24 ENCOUNTER — Emergency Department (HOSPITAL_COMMUNITY)
Admission: EM | Admit: 2016-09-24 | Discharge: 2016-09-24 | Disposition: A | Discharge status: Home / Self Care | Payer: Medicaid Other | Attending: Dermatology | Admitting: Dermatology

## 2016-09-24 ENCOUNTER — Encounter (HOSPITAL_COMMUNITY): Payer: Self-pay | Admitting: Emergency Medicine

## 2016-09-24 ENCOUNTER — Ambulatory Visit (HOSPITAL_COMMUNITY)
Admission: EM | Admit: 2016-09-24 | Discharge: 2016-09-24 | Disposition: A | Discharge status: Home / Self Care | Payer: Medicaid Other | Attending: Family Medicine | Admitting: Family Medicine

## 2016-09-24 DIAGNOSIS — I10 Essential (primary) hypertension: Secondary | ICD-10-CM | POA: Insufficient documentation

## 2016-09-24 DIAGNOSIS — F419 Anxiety disorder, unspecified: Secondary | ICD-10-CM | POA: Diagnosis not present

## 2016-09-24 DIAGNOSIS — Z5321 Procedure and treatment not carried out due to patient leaving prior to being seen by health care provider: Secondary | ICD-10-CM | POA: Diagnosis not present

## 2016-09-24 NOTE — ED Provider Notes (Signed)
[place]    CSN: [id number] Arrival date & time: 09/24/16  1415     History   Chief Complaint Chief Complaint  Patient presents with  . Hypertension    HPI Susan Walton is a 36 y.o. female.   The history is provided by the patient.  Hypertension  This is a new problem. The current episode started more than 1 week ago. The problem has not changed since onset.Pertinent negatives include no chest pain, no abdominal pain, no headaches and no shortness of breath. She has tried nothing for the symptoms.    Past Medical History:  Diagnosis Date  . Hypertension     There are no active problems to display for this patient.   Past Surgical History:  Procedure Laterality Date  . DILATION AND CURETTAGE OF UTERUS    . NO PAST SURGERIES      OB History    Gravida Para Term Preterm AB Living   5 3 3  0 1 3   SAB TAB Ectopic Multiple Live Births     1 0 0         Home Medications    Prior to Admission medications   Medication Sig Start Date End Date Taking? Authorizing Provider  ibuprofen (ADVIL,MOTRIN) 600 MG tablet Take 1 tablet (600 mg total) by mouth every 6 (six) hours as needed. 05/22/15   Jeannett Senior, PA-C    Family History History reviewed. No pertinent family history.  Social History Social History  Substance Use Topics  . Smoking status: Current Every Day Smoker  . Smokeless tobacco: Never Used  . Alcohol use Yes     Allergies   Patient has no active allergies.   Review of Systems Review of Systems  Constitutional: Negative.   Respiratory: Negative.  Negative for shortness of breath.   Cardiovascular: Negative.  Negative for chest pain.  Gastrointestinal: Negative for abdominal pain.  Neurological: Negative for headaches.  All other systems reviewed and are negative.    Physical Exam Triage Vital Signs ED Triage Vitals  Enc Vitals Group     BP 09/24/16 1510 159/90     Pulse Rate 09/24/16 1510 71     Resp 09/24/16 1510  18     Temp 09/24/16 1510 98.7 F (37.1 C)     Temp Source 09/24/16 1510 Oral     SpO2 09/24/16 1510 100 %     Weight --      Height --      Head Circumference --      Peak Flow --      Pain Score 09/24/16 1513 0     Pain Loc --      Pain Edu? --      Excl. in [place]? --    No data found.   Updated Vital Signs BP 159/90 (BP Location: Left Arm)   Pulse 71   Temp 98.7 F (37.1 C) (Oral)   Resp 18   LMP 09/03/2016 (Exact Date)   SpO2 100%   Visual Acuity Right Eye Distance:   Left Eye Distance:   Bilateral Distance:    Right Eye Near:   Left Eye Near:    Bilateral Near:     Physical Exam  Constitutional: She is oriented to person, place, and time. She appears well-developed and well-nourished. No distress.  Neck: Normal range of motion. Neck supple.  Cardiovascular: Normal rate, regular rhythm, normal heart sounds and intact distal pulses.   Pulmonary/Chest: Effort normal and breath  sounds normal.  Musculoskeletal: She exhibits no edema.  Neurological: She is alert and oriented to person, place, and time.  Skin: Skin is warm and dry.  Nursing note and vitals reviewed.    UC Treatments / Results  Labs (all labs ordered are listed, but only abnormal results are displayed) Labs Reviewed - No data to display  EKG  EKG Interpretation None       Radiology No results found.  Procedures Procedures (including critical care time)  Medications Ordered in UC Medications - No data to display   Initial Impression / Assessment and Plan / UC Course  I have reviewed the triage vital signs and the nursing notes.  Pertinent labs & imaging results that were available during my care of the patient were reviewed by me and considered in my medical decision making (see chart for details).       Final Clinical Impressions(s) / UC Diagnoses   Final diagnoses:  Anxiety    New Prescriptions Discharge Medication List as of 09/24/2016  4:05 PM       Billy Fischer,  MD 10/12/16 2141

## 2016-09-24 NOTE — ED Triage Notes (Signed)
The patient presented to the [facility] with a complaint of an elevated BP for 1 month.

## 2016-09-24 NOTE — ED Triage Notes (Addendum)
Pt st's she was at health dept today and her blood pressure was 200/108.  Pt st;s she was dx with HTN over 3 years ago and was given Rx for same but never got it filled or followed up with MD.  Pt st's she has been having blurry vision and dizziness.  Pt st;s she is homeless

## 2016-09-24 NOTE — Discharge Instructions (Signed)
No more smoking, reduce salt, monitor bp and see doctor in a few mos if still elevated

## 2016-09-24 NOTE — ED Notes (Signed)
Called x 2 in lobby  To take to a treatment room no answer.

## 2017-02-24 ENCOUNTER — Emergency Department (HOSPITAL_COMMUNITY): Payer: Medicaid Other

## 2017-02-24 ENCOUNTER — Encounter (HOSPITAL_COMMUNITY): Payer: Self-pay

## 2017-02-24 ENCOUNTER — Emergency Department (HOSPITAL_COMMUNITY)
Admission: EM | Admit: 2017-02-24 | Discharge: 2017-02-24 | Disposition: A | Discharge status: Home / Self Care | Payer: Medicaid Other | Attending: Emergency Medicine | Admitting: Emergency Medicine

## 2017-02-24 DIAGNOSIS — N390 Urinary tract infection, site not specified: Secondary | ICD-10-CM | POA: Diagnosis not present

## 2017-02-24 DIAGNOSIS — R110 Nausea: Secondary | ICD-10-CM | POA: Insufficient documentation

## 2017-02-24 DIAGNOSIS — R079 Chest pain, unspecified: Secondary | ICD-10-CM | POA: Diagnosis not present

## 2017-02-24 DIAGNOSIS — F17210 Nicotine dependence, cigarettes, uncomplicated: Secondary | ICD-10-CM | POA: Insufficient documentation

## 2017-02-24 DIAGNOSIS — I10 Essential (primary) hypertension: Secondary | ICD-10-CM | POA: Diagnosis not present

## 2017-02-24 DIAGNOSIS — M549 Dorsalgia, unspecified: Secondary | ICD-10-CM | POA: Insufficient documentation

## 2017-02-24 DIAGNOSIS — R109 Unspecified abdominal pain: Secondary | ICD-10-CM | POA: Diagnosis present

## 2017-02-24 DIAGNOSIS — E039 Hypothyroidism, unspecified: Secondary | ICD-10-CM | POA: Insufficient documentation

## 2017-02-24 HISTORY — DX: Disorder of thyroid, unspecified: E07.9

## 2017-02-24 LAB — URINALYSIS, ROUTINE W REFLEX MICROSCOPIC
Bilirubin Urine: NEGATIVE
GLUCOSE, UA: NEGATIVE mg/dL
Ketones, ur: 20 mg/dL — AB
NITRITE: POSITIVE — AB
PH: 5 (ref 5.0–8.0)
PROTEIN: 100 mg/dL — AB
Specific Gravity, Urine: 1.017 (ref 1.005–1.030)

## 2017-02-24 LAB — CBC
HEMATOCRIT: 38.3 % (ref 36.0–46.0)
HEMOGLOBIN: 12 g/dL (ref 12.0–15.0)
MCH: 24.7 pg — ABNORMAL LOW (ref 26.0–34.0)
MCHC: 31.3 g/dL (ref 30.0–36.0)
MCV: 79 fL (ref 78.0–100.0)
Platelets: 301 10*3/uL (ref 150–400)
RBC: 4.85 MIL/uL (ref 3.87–5.11)
RDW: 17.4 % — ABNORMAL HIGH (ref 11.5–15.5)
WBC: 10.2 10*3/uL (ref 4.0–10.5)

## 2017-02-24 LAB — BASIC METABOLIC PANEL
ANION GAP: 10 (ref 5–15)
BUN: 8 mg/dL (ref 6–20)
CO2: 21 mmol/L — AB (ref 22–32)
Calcium: 9.4 mg/dL (ref 8.9–10.3)
Chloride: 103 mmol/L (ref 101–111)
Creatinine, Ser: 1.03 mg/dL — ABNORMAL HIGH (ref 0.44–1.00)
GFR calc Af Amer: >60 mL/min (ref >60)
GLUCOSE: 100 mg/dL — AB (ref 65–99)
Potassium: 3.6 mmol/L (ref 3.5–5.1)
Sodium: 134 mmol/L — ABNORMAL LOW (ref 135–145)

## 2017-02-24 LAB — I-STAT TROPONIN, ED: TROPONIN I, POC: 0 ng/mL (ref 0.00–0.08)

## 2017-02-24 LAB — I-STAT CG4 LACTIC ACID, ED: Lactic Acid, Venous: 1.28 mmol/L (ref 0.5–1.9)

## 2017-02-24 MED ORDER — ONDANSETRON HCL 4 MG PO TABS: 4.0000 mg | ORAL_TABLET | Freq: Three times a day (TID) | ORAL | 0 refills | Status: DC | PRN

## 2017-02-24 MED ORDER — SODIUM CHLORIDE 0.9 % IV BOLUS (SEPSIS)
1000.0000 mL | Freq: Once | INTRAVENOUS | Status: AC
  Administered 2017-02-24: 1000 mL via INTRAVENOUS

## 2017-02-24 MED ORDER — MORPHINE SULFATE (PF) 4 MG/ML IV SOLN
4.0000 mg | Freq: Once | INTRAVENOUS | Status: AC
  Administered 2017-02-24: 4 mg via INTRAVENOUS
  Filled 2017-02-24: qty 1

## 2017-02-24 MED ORDER — DEXTROSE 5 % IV SOLN
1.0000 g | Freq: Once | INTRAVENOUS | Status: AC
  Administered 2017-02-24: 1 g via INTRAVENOUS
  Filled 2017-02-24: qty 10

## 2017-02-24 MED ORDER — ONDANSETRON HCL 4 MG/2ML IJ SOLN
4.0000 mg | Freq: Once | INTRAMUSCULAR | Status: AC
  Administered 2017-02-24: 4 mg via INTRAVENOUS
  Filled 2017-02-24: qty 2

## 2017-02-24 MED ORDER — HYDROCODONE-ACETAMINOPHEN 5-325 MG PO TABS: 1.0000 | ORAL_TABLET | Freq: Four times a day (QID) | ORAL | 0 refills | Status: DC | PRN

## 2017-02-24 MED ORDER — SULFAMETHOXAZOLE-TRIMETHOPRIM 800-160 MG PO TABS: 1.0000 | ORAL_TABLET | Freq: Two times a day (BID) | ORAL | 0 refills | Status: AC

## 2017-02-24 NOTE — Discharge Instructions (Signed)
Please seek immediate care if you develop the following: ?There is back pain.  ?Your symptoms are no better or worse in 3 days. ?There is severe back pain or lower abdominal pain.  ?You develop chills.  ?You have a fever.  ?There is nausea or vomiting.  ?There is continued burning or discomfort with urination.  ? ?

## 2017-02-24 NOTE — ED Triage Notes (Signed)
Per Pt, Pt reports two days ago taking a Viagra accidently and eating some undercooked sausage in one day. Pt reports starting to have some chest pain yesterday along with N/V. Reports burning with urination that has occurred along with inability to have a BM. Pt reports feeling hot, but does not have a fever upon assessment.

## 2017-02-24 NOTE — ED Provider Notes (Signed)
[facility] DEPT Provider Note   CSN: [id number] Arrival date & time: 02/24/17  0737     History   Chief Complaint Chief Complaint  Patient presents with  . Flank Pain    HPI Susan Walton is a 36 y.o. female who presents with multiple complaints. Chiefly, she c/o Nause, abdominal pain and BL flank pain. She states that she has had post void dysuria and spasm for the past 2 weeks. Over the past wo days she developed, back pain. Abdominal pain, chills, myalgias and fatigue. She has subjective fever and rigors. She denies other urinary or vaginal sxs. She took a viagra last night because she thought it was an Curator.   HPI  Past Medical History:  Diagnosis Date  . Hypertension   . Thyroid disease    Hypothyroidism    There are no active problems to display for this patient.   Past Surgical History:  Procedure Laterality Date  . DILATION AND CURETTAGE OF UTERUS    . EYE SURGERY    . NO PAST SURGERIES      OB History    Gravida Para Term Preterm AB Living   5 3 3  0 1 3   SAB TAB Ectopic Multiple Live Births     1 0 0         Home Medications    Prior to Admission medications   Medication Sig Start Date End Date Taking? Authorizing Provider  ibuprofen (ADVIL,MOTRIN) 600 MG tablet Take 1 tablet (600 mg total) by mouth every 6 (six) hours as needed. 05/22/15   Jeannett Senior, PA-C    Family History No family history on file.  Social History Social History  Substance Use Topics  . Smoking status: Current Every Day Smoker    Packs/day: 0.50    Types: Cigarettes  . Smokeless tobacco: Never Used  . Alcohol use Yes     Allergies   Patient has no known allergies.   Review of Systems Review of Systems  Ten systems reviewed and are negative for acute change, except as noted in the HPI. -  Physical Exam Updated Vital Signs BP (!) 152/119 (BP Location: Left Arm)   Pulse (!) 109   Temp 98.2 F (36.8 C) (Oral)   Resp 18   Ht 5\' 8"  (1.727 m)   Wt  77.1 kg (170 lb)   LMP 02/04/2017   SpO2 100%   BMI 25.85 kg/m   Physical Exam  Constitutional: She is oriented to person, place, and time. She appears well-developed and well-nourished. No distress.  HENT:  Head: Normocephalic and atraumatic.  Eyes: Conjunctivae are normal. No scleral icterus.  Neck: Normal range of motion.  Cardiovascular: Normal rate, regular rhythm and normal heart sounds.  Exam reveals no gallop and no friction rub.   No murmur heard. Pulmonary/Chest: Effort normal and breath sounds normal. No respiratory distress.  Abdominal: Soft. Bowel sounds are normal. She exhibits no distension and no mass. There is no tenderness. There is no guarding.  BL cva tenderness  Neurological: She is alert and oriented to person, place, and time.  Skin: Skin is warm and dry. She is not diaphoretic.  Psychiatric: Her behavior is normal.  Nursing note and vitals reviewed.    ED Treatments / Results  Labs (all labs ordered are listed, but only abnormal results are displayed) Labs Reviewed  CBC - Abnormal; Notable for the following:       Result Value   MCH 24.7 (*)  RDW 17.4 (*)    All other components within normal limits  URINALYSIS, ROUTINE W REFLEX MICROSCOPIC  BASIC METABOLIC PANEL  I-STAT CG4 LACTIC ACID, ED  I-STAT TROPOININ, ED    EKG  EKG Interpretation None       Radiology No results found.  Procedures Procedures (including critical care time)  Medications Ordered in ED Medications - No data to display   Initial Impression / Assessment and Plan / ED Course  I have reviewed the triage vital signs and the nursing notes.  Pertinent labs & imaging results that were available during my care of the patient were reviewed by me and considered in my medical decision making (see chart for details).     Pt has been diagnosed with a UTI. Likely early pyelo. Vomiting and pain resolved. Patient is hypertensive and she is advised to follow-up with a primary  care physician regarding this. She is also noted to be per routine. Neurologic which may be secondary to her infection. However, should be followed because of her hypertension. I discussed this with the patient. She understands the importance of follow-up Pt to be dc home with antibiotics and instructions to follow up with PCP if symptoms persist.   Final Clinical Impressions(s) / ED Diagnoses   Final diagnoses:  Urinary tract infection without hematuria, site unspecified  Hypertension, unspecified type    New Prescriptions New Prescriptions   No medications on file     Margarita Mail, PA-C 03/04/17 [place], [place], MD 03/27/17 [phone]

## 2017-04-23 ENCOUNTER — Emergency Department (HOSPITAL_COMMUNITY): Payer: Medicaid Other

## 2017-04-23 ENCOUNTER — Encounter (HOSPITAL_COMMUNITY): Payer: Self-pay | Admitting: Emergency Medicine

## 2017-04-23 ENCOUNTER — Emergency Department (HOSPITAL_COMMUNITY)
Admission: EM | Admit: 2017-04-23 | Discharge: 2017-04-23 | Disposition: A | Discharge status: Home / Self Care | Payer: Medicaid Other | Attending: Emergency Medicine | Admitting: Emergency Medicine

## 2017-04-23 DIAGNOSIS — Z202 Contact with and (suspected) exposure to infections with a predominantly sexual mode of transmission: Secondary | ICD-10-CM | POA: Diagnosis present

## 2017-04-23 DIAGNOSIS — F17210 Nicotine dependence, cigarettes, uncomplicated: Secondary | ICD-10-CM | POA: Insufficient documentation

## 2017-04-23 DIAGNOSIS — O2691 Pregnancy related conditions, unspecified, first trimester: Secondary | ICD-10-CM | POA: Insufficient documentation

## 2017-04-23 DIAGNOSIS — N760 Acute vaginitis: Secondary | ICD-10-CM | POA: Diagnosis not present

## 2017-04-23 DIAGNOSIS — Z3A01 Less than 8 weeks gestation of pregnancy: Secondary | ICD-10-CM | POA: Diagnosis not present

## 2017-04-23 DIAGNOSIS — E039 Hypothyroidism, unspecified: Secondary | ICD-10-CM | POA: Diagnosis not present

## 2017-04-23 DIAGNOSIS — I10 Essential (primary) hypertension: Secondary | ICD-10-CM | POA: Insufficient documentation

## 2017-04-23 DIAGNOSIS — B9689 Other specified bacterial agents as the cause of diseases classified elsewhere: Secondary | ICD-10-CM

## 2017-04-23 LAB — URINALYSIS, ROUTINE W REFLEX MICROSCOPIC
BACTERIA UA: NONE SEEN
BILIRUBIN URINE: NEGATIVE
Glucose, UA: NEGATIVE mg/dL
Hgb urine dipstick: NEGATIVE
KETONES UR: 80 mg/dL — AB
LEUKOCYTES UA: NEGATIVE
NITRITE: NEGATIVE
Protein, ur: 30 mg/dL — AB
Specific Gravity, Urine: 1.027 (ref 1.005–1.030)
pH: 5 (ref 5.0–8.0)

## 2017-04-23 LAB — WET PREP, GENITAL
Sperm: NONE SEEN
Trich, Wet Prep: NONE SEEN
YEAST WET PREP: NONE SEEN

## 2017-04-23 LAB — I-STAT BETA HCG BLOOD, ED (MC, WL, AP ONLY)

## 2017-04-23 LAB — POC URINE PREG, ED: Preg Test, Ur: POSITIVE — AB

## 2017-04-23 LAB — RAPID STREP SCREEN (MED CTR MEBANE ONLY): STREPTOCOCCUS, GROUP A SCREEN (DIRECT): NEGATIVE

## 2017-04-23 MED ORDER — MAGIC MOUTHWASH
10.0000 mL | Freq: Once | ORAL | Status: AC
  Administered 2017-04-23: 10 mL via ORAL
  Filled 2017-04-23: qty 10

## 2017-04-23 MED ORDER — PROMETHAZINE HCL 25 MG PO TABS: 25.0000 mg | ORAL_TABLET | Freq: Four times a day (QID) | ORAL | 0 refills | Status: DC | PRN

## 2017-04-23 MED ORDER — LIDOCAINE HCL (PF) 1 % IJ SOLN
INTRAMUSCULAR | Status: AC
  Administered 2017-04-23: 0.9 mL
  Filled 2017-04-23: qty 5

## 2017-04-23 MED ORDER — CEPHALEXIN 500 MG PO CAPS: 500.0000 mg | ORAL_CAPSULE | Freq: Three times a day (TID) | ORAL | 0 refills | Status: DC

## 2017-04-23 MED ORDER — CEFTRIAXONE SODIUM 250 MG IJ SOLR
250.0000 mg | Freq: Once | INTRAMUSCULAR | Status: AC
  Administered 2017-04-23: 250 mg via INTRAMUSCULAR
  Filled 2017-04-23: qty 250

## 2017-04-23 MED ORDER — METRONIDAZOLE 0.75 % VA GEL: 1.0000 | Freq: Two times a day (BID) | VAGINAL | 0 refills | Status: AC

## 2017-04-23 MED ORDER — AZITHROMYCIN 250 MG PO TABS
1000.0000 mg | ORAL_TABLET | Freq: Once | ORAL | Status: AC
  Administered 2017-04-23: 1000 mg via ORAL
  Filled 2017-04-23: qty 4

## 2017-04-23 NOTE — ED Notes (Signed)
Patient transported to Ultrasound 

## 2017-04-23 NOTE — Discharge Instructions (Signed)
You are 5 weeks and 2 days pregnant.  You will need to follow up with [facility] tomorrow to have your quantitative HCG recheck and reassessment to ensure you do not have ectopic pregnancy.  You also have findings concerning for bacterial vaginosis and pelvic inflammatory disease.  Take antibiotics as prescribed.  Avoid sexual activities until your symptoms resolved.  Return promptly if you have any concerns.

## 2017-04-23 NOTE — ED Triage Notes (Signed)
Pt reports sore throat for 3 days, also reports exposure to gonorrhea. Pt would like to be treated for gonorrhea. Pt also reports she did not go to work yesterday because she ate a ham sand which and had food poisoning and would like a work note for yesterday.

## 2017-04-23 NOTE — ED Provider Notes (Signed)
[facility] DEPT Provider Note   CSN: [id number] Arrival date & time: 04/23/17  0745     History   Chief Complaint Chief Complaint  Patient presents with  . Sore Throat  . Exposure to STD  . Possible Pregnancy    HPI Susan Walton is a 36 y.o. female.  HPI   36 year old female presenting with complaining of exposure to STI.  Patient is a G4 P4. A week and half ago her sexual partner notified to her that she may have gonorrhea because he recently was diagnosed with that. Past week she has noticed increased vaginal discharge with some foul odor. For the past 2 days she has been playing of runny nose sneezing coughing and sore throat, having chills, and yesterday she missed work due to having nausea and vomiting after eating some ham sandwich. She believes she may have had food poisoning. She is here today requesting for work note. She is also concerned of being pregnant as she is a bit late on her menstruation. Her last menstrual period was July 1. She has had 4 separate sexual partners within the past 6 months without using protection. Does report prior history of STI including gonorrhea, trichomonas, and syphilis was treated. Endorse some mild abdominal cramping today without any vaginal bleeding.    Past Medical History:  Diagnosis Date  . Hypertension   . Thyroid disease    Hypothyroidism    There are no active problems to display for this patient.   Past Surgical History:  Procedure Laterality Date  . DILATION AND CURETTAGE OF UTERUS    . EYE SURGERY    . NO PAST SURGERIES      OB History    Gravida Para Term Preterm AB Living   5 3 3  0 1 3   SAB TAB Ectopic Multiple Live Births     1 0 0         Home Medications    Prior to Admission medications   Medication Sig Start Date End Date Taking? Authorizing Provider  HYDROcodone-acetaminophen (NORCO) 5-325 MG tablet Take 1-2 tablets by mouth every 6 (six) hours as needed. 02/24/17   Margarita Mail, PA-C    ibuprofen (ADVIL,MOTRIN) 600 MG tablet Take 1 tablet (600 mg total) by mouth every 6 (six) hours as needed. Patient not taking: Reported on 02/24/2017 05/22/15   Jeannett Senior, PA-C  ondansetron (ZOFRAN) 4 MG tablet Take 1 tablet (4 mg total) by mouth every 8 (eight) hours as needed for nausea or vomiting. 02/24/17   Margarita Mail, PA-C    Family History No family history on file.  Social History Social History  Substance Use Topics  . Smoking status: Current Every Day Smoker    Packs/day: 0.50    Types: Cigarettes  . Smokeless tobacco: Never Used  . Alcohol use Yes     Allergies   Patient has no known allergies.   Review of Systems Review of Systems  All other systems reviewed and are negative.    Physical Exam Updated Vital Signs BP (!) 144/98   Pulse 89   Temp 98.2 F (36.8 C)   Resp 18   LMP 03/15/2017   SpO2 100%   Physical Exam  Constitutional: She appears well-developed and well-nourished. No distress.  HENT:  Head: Atraumatic.  Right Ear: External ear normal.  Left Ear: External ear normal.  Nose: Nose normal.  Throat: Uvula is midline, mild erythematous bilateral tonsillar without significant exudates. No trismus  Eyes: Conjunctivae are normal.  Neck: Normal range of motion. Neck supple.  Cardiovascular: Normal rate and regular rhythm.   Pulmonary/Chest: Effort normal and breath sounds normal.  Abdominal: Soft. Bowel sounds are normal. She exhibits no distension. There is no tenderness.  Genitourinary:  Genitourinary Comments: Chaperone present during exam. No inguinal lymphadenopathy or inguinal hernia noted. Nongravid abdomen. Normal external genitalia. Moderate amount of vaginal discharge noted in vaginal vault. Closed cervical os free of lesion or rash. No vaginal bleeding noted. On bimanual examination, bilateral adnexal tenderness with cervical motion tenderness.  Lymphadenopathy:    She has no cervical adenopathy.  Neurological: She is  alert.  Skin: No rash noted.  Psychiatric: She has a normal mood and affect.  Nursing note and vitals reviewed.    ED Treatments / Results  Labs (all labs ordered are listed, but only abnormal results are displayed) Labs Reviewed  WET PREP, GENITAL - Abnormal; Notable for the following:       Result Value   Clue Cells Wet Prep HPF POC PRESENT (*)    WBC, Wet Prep HPF POC MANY (*)    All other components within normal limits  URINALYSIS, ROUTINE W REFLEX MICROSCOPIC - Abnormal; Notable for the following:    APPearance CLOUDY (*)    Ketones, ur 80 (*)    Protein, ur 30 (*)    Squamous Epithelial / LPF TOO NUMEROUS TO COUNT (*)    All other components within normal limits  POC URINE PREG, ED - Abnormal; Notable for the following:    Preg Test, Ur POSITIVE (*)    All other components within normal limits  I-STAT BETA HCG BLOOD, ED ([facility], [facility], [facility] ONLY) - Abnormal; Notable for the following:    I-stat hCG, quantitative >2,000.0 (*)    All other components within normal limits  RAPID STREP SCREEN (NOT AT [facility])  CULTURE, GROUP A STREP ([place])  RPR  HIV ANTIBODY (ROUTINE TESTING)  GC/CHLAMYDIA PROBE AMP ([place]) NOT AT [facility]    EKG  EKG Interpretation None       Radiology US Ob Comp < 14 Wks  Result Date: 04/23/2017 CLINICAL DATA:  Pelvic pain. Vaginal discharge. First-trimester pregnancy. EXAM: OBSTETRIC <14 WK [place] AND TRANSVAGINAL OB US TECHNIQUE: Both transabdominal and transvaginal ultrasound examinations were performed for complete evaluation of the gestation as well as the maternal uterus, adnexal regions, and pelvic cul-de-sac. Transvaginal technique was performed to assess early pregnancy. COMPARISON:  2186 for the upper a stacked cross apex FINDINGS: Intrauterine gestational sac: Likely present, seen on image 36 with possible intra decidual sac sign. This hypoechoic area was subtle on the cine clip, but is likely present in the mid endometrial cavity. Yolk sac:  Not seen  Embryo:  Not seen MSD: 5.8  mm   5 w   2  d Subchorionic hemorrhage:  None visualized. Maternal uterus/adnexae: There are 3 hypoechoic intramural uterine masses, measuring up to 1.8 cm. Hypoechoic area in the right ovary measuring 1.9 cm, likely a hemorrhagic cyst. 2 cm isoechoic area in the left adnexae which is next to the ovary. On the available images it is unclear if this is intra or extra ovarian. These results were called by telephone at the time of interpretation on 04/23/2017 at 1:23 pm to Dr. Domenic Moras , who verbally acknowledged these results. IMPRESSION: 1. Pregnancy of unknown location. There is a intrauterine sac which may reflect early (5 week 2 day) gestational sac but does not have definitive yolk sac or fetal pole. Additionally,  there is an indeterminate 2 cm left adnexal mass which may be extra-ovarian. Ectopic pregnancy is not excluded, recommend close follow-up quantitative beta HCG and sonography. No pelvic fluid. 2. 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 3. History of PID.  No hydrosalpinx or evidence of adnexal abscess. Electronically Signed   By: Monte Fantasia M.D.   On: 04/23/2017 13:23   US Ob Transvaginal  Result Date: 04/23/2017 CLINICAL DATA:  Pelvic pain. Vaginal discharge. First-trimester pregnancy. EXAM: OBSTETRIC <14 WK [place] AND TRANSVAGINAL OB US TECHNIQUE: Both transabdominal and transvaginal ultrasound examinations were performed for complete evaluation of the gestation as well as the maternal uterus, adnexal regions, and pelvic cul-de-sac. Transvaginal technique was performed to assess early pregnancy. COMPARISON:  2186 for the upper a stacked cross apex FINDINGS: Intrauterine gestational sac: Likely present, seen on image 73 with possible intra decidual sac sign. This hypoechoic area was subtle on the cine clip, but is likely present in the mid endometrial cavity. Yolk sac:  Not seen Embryo:  Not seen MSD: 5.8  mm   5 w   2  d Subchorionic hemorrhage:  None  visualized. Maternal uterus/adnexae: There are 3 hypoechoic intramural uterine masses, measuring up to 1.8 cm. Hypoechoic area in the right ovary measuring 1.9 cm, likely a hemorrhagic cyst. 2 cm isoechoic area in the left adnexae which is next to the ovary. On the available images it is unclear if this is intra or extra ovarian. These results were called by telephone at the time of interpretation on 04/23/2017 at 1:23 pm to Dr. Domenic Moras , who verbally acknowledged these results. IMPRESSION: 1. Pregnancy of unknown location. There is a intrauterine sac which may reflect early (5 week 2 day) gestational sac but does not have definitive yolk sac or fetal pole. Additionally, there is an indeterminate 2 cm left adnexal mass which may be extra-ovarian. Ectopic pregnancy is not excluded, recommend close follow-up quantitative beta HCG and sonography. No pelvic fluid. 2. 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 3. History of PID.  No hydrosalpinx or evidence of adnexal abscess. Electronically Signed   By: Monte Fantasia M.D.   On: 04/23/2017 13:23    Procedures Procedures (including critical care time)  Medications Ordered in ED Medications  cefTRIAXone (ROCEPHIN) injection 250 mg (250 mg Intramuscular Given 04/23/17 1235)  azithromycin (ZITHROMAX) tablet 1,000 mg (1,000 mg Oral Given 04/23/17 1234)  lidocaine (PF) (XYLOCAINE) 1 % injection (0.9 mLs  Given 04/23/17 1241)  magic mouthwash (10 mLs Oral Given 04/23/17 1352)     Initial Impression / Assessment and Plan / ED Course  I have reviewed the triage vital signs and the nursing notes.  Pertinent labs & imaging results that were available during my care of the patient were reviewed by me and considered in my medical decision making (see chart for details).     BP (!) 153/99   Pulse 77   Temp 98.2 F (36.8 C)   Resp 18   LMP 03/15/2017   SpO2 100%    Final Clinical Impressions(s) / ED Diagnoses   Final diagnoses:  BV (bacterial  vaginosis)  Exposure to STD  Less than [redacted] weeks gestation of pregnancy    New Prescriptions New Prescriptions   CEPHALEXIN (KEFLEX) 500 MG CAPSULE    Take 1 capsule (500 mg total) by mouth 3 (three) times daily.   METRONIDAZOLE (METROGEL VAGINAL) 0.75 % VAGINAL GEL    Place 1 Applicatorful vaginally 2 (  two) times daily.   PROMETHAZINE (PHENERGAN) 25 MG TABLET    Take 1 tablet (25 mg total) by mouth every 6 (six) hours as needed for nausea.   10:50 AM Pt here with multiple complaints.  First, she is concern of being pregnant due to being late on her menstruation.   The preg test today is positive. Furthermore she also complaining of URI symptoms including sore throat. Throat exam shown some mildly erythematous tonsils. No evidence to suggest peritonsillar abscess or deep tissue infection. Rapid strep test negative. Suspect gonococcal pharyngitis versus viral infection. Also reports an exposure to gonorrhea. She will benefit from an STI screen including pelvic exam. No significant abdominal tenderness on exam therefore no suspicion for ectopic pregnancy. May consider transvaginal ultrasound to assess IUP.  2:07 PM Wet prep with presence of clue cells and many WBC. This raises suspicion for PID. Transvaginal ultrasound demonstrate patency of unknown location as there is an intrauterine sac which may reflect 5 weeks and 2 days pregnancy however it does not have a definitive yolk sac or fetal pole. Therefore, ectopic pregnancy is not excluded. No evidence of TOA or hydrosalpinx.  2:59 PM I discussed this finding with patient. I strongly encouraged [facility] and to reassess to ensure patient does not have low ectopic pregnancy. Patient also discharged home with education to treat for bacterial vaginosis, as well as urinary tract infection. Rocephin and Zithromax is given already. Patient is stable for discharge. Prompt return precaution  discussed.   Domenic Moras, PA-C 04/23/17 1500    Long, Wonda [place], MD 04/23/17 1600

## 2017-04-24 ENCOUNTER — Encounter (HOSPITAL_COMMUNITY): Payer: Self-pay

## 2017-04-24 ENCOUNTER — Emergency Department (HOSPITAL_COMMUNITY)
Admission: EM | Admit: 2017-04-24 | Discharge: 2017-04-24 | Disposition: A | Discharge status: Home / Self Care | Payer: Medicaid Other | Attending: Emergency Medicine | Admitting: Emergency Medicine

## 2017-04-24 DIAGNOSIS — F17200 Nicotine dependence, unspecified, uncomplicated: Secondary | ICD-10-CM | POA: Insufficient documentation

## 2017-04-24 DIAGNOSIS — H1032 Unspecified acute conjunctivitis, left eye: Secondary | ICD-10-CM | POA: Insufficient documentation

## 2017-04-24 DIAGNOSIS — H5712 Ocular pain, left eye: Secondary | ICD-10-CM | POA: Diagnosis present

## 2017-04-24 HISTORY — DX: Serous retinal detachment, unspecified eye: H33.20

## 2017-04-24 LAB — RPR: RPR Ser Ql: NONREACTIVE

## 2017-04-24 LAB — CULTURE, GROUP A STREP (THRC)

## 2017-04-24 LAB — HIV ANTIBODY (ROUTINE TESTING W REFLEX): HIV SCREEN 4TH GENERATION: NONREACTIVE

## 2017-04-24 MED ORDER — ERYTHROMYCIN 5 MG/GM OP OINT: TOPICAL_OINTMENT | OPHTHALMIC | 0 refills | Status: DC

## 2017-04-24 MED ORDER — TETRACAINE HCL 0.5 % OP SOLN
1.0000 [drp] | Freq: Once | OPHTHALMIC | Status: AC
  Administered 2017-04-24: 1 [drp] via OPHTHALMIC
  Filled 2017-04-24: qty 4

## 2017-04-24 NOTE — ED Provider Notes (Signed)
[place] DEPT Provider Note   CSN: [id number] Arrival date & time: 04/24/17  1242     History   Chief Complaint Chief Complaint  Patient presents with  . Eye Pain    HPI Susan Walton is a 36 y.o. female.  HPI   36 year old female presents today with complaints of eye irritation.  Patient notes a significant past medical history of retinal detachment in 2015.  She notes she had a shard of glass that went into her left eye.  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  She she notes occasionally she has some discomfort in the eye where it will become red and irritated but resolves on its own.  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  She notes this is caused irritation in both of her eyes with redness in the left and pain in the right.  She notes sunlight causes worsening pain in the right eye, and when she attempts to focus on objects she becomes somewhat dizzy as she feels like her eyes trying to move side to side.  Patient notes that after closing her eyes and resting she has improvement her symptoms.  Patient denies any headache, neurological deficits, fever chills.  Patient also reports she is [redacted] weeks pregnant.    Past Medical History:  Diagnosis Date  . Retinal detachment     There are no active problems to display for this patient.   History reviewed. No pertinent surgical history.  OB History    Gravida Para Term Preterm AB Living   1             SAB TAB Ectopic Multiple Live Births                   Home Medications    Prior to Admission medications   Medication Sig Start Date End Date Taking? Authorizing Provider  erythromycin ophthalmic ointment Place a 1/2 inch ribbon of ointment into the lower eyelid. 04/24/17   Okey Regal, PA-C    Family History History reviewed. No pertinent family history.  Social History Social History    Substance Use Topics  . Smoking status: Current Every Day Smoker  . Smokeless tobacco: Never Used  . Alcohol use Yes     Allergies   Patient has no known allergies.   Review of Systems Review of Systems  All other systems reviewed and are negative.    Physical Exam Updated Vital Signs There were no vitals taken for this visit.  Physical Exam  Constitutional: She is oriented to person, place, and time. She appears well-developed and well-nourished.  HENT:  Head: Normocephalic and atraumatic.  Eyes: Right eye exhibits no discharge. Left eye exhibits no discharge. No scleral icterus.  Left eye conjunctival redness, no discharge noted.  Cloudy cornea with nonreactive pupil-left lateral gaze (baseline per patient)  Right eye no conjunctival irritation, pupils round reactive to light-extraocular movements are pain-free and normal, no surrounding irritation  Neck: Normal range of motion. No JVD present. No tracheal deviation present.  Pulmonary/Chest: Effort normal. No stridor.  Neurological: She is alert and oriented to person, place, and time. Coordination normal.  Psychiatric: She has a normal mood and affect. Her behavior is normal. Judgment and thought content normal.  Nursing note and vitals reviewed.    ED Treatments / Results  Labs (all labs ordered  are listed, but only abnormal results are displayed) Labs Reviewed - No data to display  EKG  EKG Interpretation None       Radiology No results found.  Procedures Procedures (including critical care time)  Medications Ordered in ED Medications  tetracaine (PONTOCAINE) 0.5 % ophthalmic solution 1 drop (not administered)     Initial Impression / Assessment and Plan / ED Course  I have reviewed the triage vital signs and the nursing notes.  Pertinent labs & imaging results that were available during my care of the patient were reviewed by me and considered in my medical decision making (see chart for  details).      Final Clinical Impressions(s) / ED Diagnoses   Final diagnoses:  Acute conjunctivitis of left eye, unspecified acute conjunctivitis type   Labs:   Imaging: (Tono-Pen nonfunctioning)  Consults:  Therapeutics:  Discharge Meds: Erythromycin  Assessment/Plan: 36 year old female presents today with complaints of eye discomfort.  Patient has irritation of her left eye that is questionable for gingivitis.  Patient will be placed on erythromycin for this.  Patient is also having discomfort in her right eye which she attributes to new workload and eye strain.  I agree she has no acute findings on her physical exam today, her vision acuity is normal.  Patient reports dizziness with eye movements, none at rest.  Tono-Pen was nonfunctioning today, I have very low suspicion for increased intraocular pressure in this patient.  Patient attempted to follow-up with ophthalmology but needed a referral.  She will be referred to on-call ophthalmologist, she will contact him today schedule close follow-up evaluation.  She will use medication for conjunctivitis, she has a follow-up appointment with her OB/GYN as she is [redacted] weeks pregnant today.  She is given strict return precautions, she verbalized understanding and agreement to today's plan had no further questions or concerns at the time discharge.    New Prescriptions New Prescriptions   ERYTHROMYCIN OPHTHALMIC OINTMENT    Place a 1/2 inch ribbon of ointment into the lower eyelid.     Okey Regal, PA-C 04/24/17 1426    Charlesetta Shanks, MD 05/03/17 [phone]

## 2017-04-24 NOTE — Discharge Instructions (Signed)
Please contact ophthalmology immediately for follow-up evaluation.  If he explains any new or worsening signs or symptoms return immediately for further evaluation and management.

## 2017-04-24 NOTE — ED Triage Notes (Addendum)
Per EMS, pt from running errands.  Pt c/o eye pain/itching.  Pt had shattered cornea with retinal detach three years ago.  Pt started having pain in left eye but now has moved to right.  Vitals:  148/92, hr 76, resp 18,

## 2017-04-25 ENCOUNTER — Telehealth: Payer: Self-pay

## 2017-04-25 NOTE — Telephone Encounter (Signed)
Already prescribed Cephalexin for 7 days TID. Extended to 10 days. Additional 3 days called to [facility] [phone] per Caryl Ada PA

## 2017-04-25 NOTE — Progress Notes (Signed)
ED Antimicrobial Stewardship Positive Culture Follow Up   Susan Walton is an 36 y.o. female who presented to [facility] on 04/23/2017 with a chief complaint of  Chief Complaint  Patient presents with  . Sore Throat  . Exposure to STD  . Possible Pregnancy    Recent Results (from the past 720 hour(s))  Rapid strep screen     Status: None   Collection Time: 04/23/17  7:57 AM  Result Value Ref Range Status   Streptococcus, Group A Screen (Direct) NEGATIVE NEGATIVE Final    Comment: (NOTE) A Rapid Antigen test may result negative if the antigen level in the sample is below the detection level of this test. The FDA has not cleared this test as a stand-alone test therefore the rapid antigen negative result has reflexed to a Group A Strep culture.   Culture, group A strep     Status: None   Collection Time: 04/23/17  7:57 AM  Result Value Ref Range Status   Specimen Description THROAT  Final   Special Requests NONE Reflexed from [id number]  Final   Culture MODERATE GROUP A STREP (S.PYOGENES) ISOLATED  Final   Report Status 04/24/2017 FINAL  Final  Wet prep, genital     Status: Abnormal   Collection Time: 04/23/17 11:29 AM  Result Value Ref Range Status   Yeast Wet Prep HPF POC NONE SEEN NONE SEEN Final   Trich, Wet Prep NONE SEEN NONE SEEN Final   Clue Cells Wet Prep HPF POC PRESENT (A) NONE SEEN Final   WBC, Wet Prep HPF POC MANY (A) NONE SEEN Final   Sperm NONE SEEN  Final    Treated with cephalexin 500mg  PO TID x 7 days  New antibiotic prescription: Extend out cephalexin 500mg  PO TID to 10 days total treatment (previously 7 days)  ED Provider: Alyse Low, PA   Elicia Lamp, PharmD, BCPS Clinical Pharmacist 04/25/2017 10:23 AM

## 2017-04-27 ENCOUNTER — Encounter (HOSPITAL_COMMUNITY): Payer: Self-pay | Admitting: Emergency Medicine

## 2017-04-27 LAB — GC/CHLAMYDIA PROBE AMP (~~LOC~~) NOT AT ARMC
CHLAMYDIA, DNA PROBE: NEGATIVE
NEISSERIA GONORRHEA: POSITIVE — AB

## 2017-04-30 ENCOUNTER — Inpatient Hospital Stay (HOSPITAL_COMMUNITY): Payer: Medicaid Other

## 2017-04-30 ENCOUNTER — Encounter (HOSPITAL_COMMUNITY): Payer: Self-pay | Admitting: *Deleted

## 2017-04-30 ENCOUNTER — Inpatient Hospital Stay (HOSPITAL_COMMUNITY)
Admission: AD | Admit: 2017-04-30 | Discharge: 2017-04-30 | Disposition: A | Discharge status: Home / Self Care | Payer: Medicaid Other | Source: Ambulatory Visit | Attending: Family Medicine | Admitting: Family Medicine

## 2017-04-30 ENCOUNTER — Other Ambulatory Visit: Payer: Self-pay | Admitting: Student

## 2017-04-30 DIAGNOSIS — Z3A01 Less than 8 weeks gestation of pregnancy: Secondary | ICD-10-CM | POA: Insufficient documentation

## 2017-04-30 DIAGNOSIS — O200 Threatened abortion: Secondary | ICD-10-CM

## 2017-04-30 DIAGNOSIS — N939 Abnormal uterine and vaginal bleeding, unspecified: Secondary | ICD-10-CM

## 2017-04-30 LAB — WET PREP, GENITAL
CLUE CELLS WET PREP: NONE SEEN
Sperm: NONE SEEN
TRICH WET PREP: NONE SEEN
Yeast Wet Prep HPF POC: NONE SEEN

## 2017-04-30 LAB — HCG, QUANTITATIVE, PREGNANCY: hCG, Beta Chain, Quant, S: 20767 m[IU]/mL — ABNORMAL HIGH (ref <5)

## 2017-04-30 MED ORDER — CEFTRIAXONE SODIUM 250 MG IJ SOLR
250.0000 mg | Freq: Once | INTRAMUSCULAR | Status: AC
  Administered 2017-04-30: 250 mg via INTRAMUSCULAR
  Filled 2017-04-30: qty 250

## 2017-04-30 NOTE — MAU Provider Note (Signed)
Patient Susan Walton is a 36 y.o. [id number] At Unknown here for a follow-up betahcg. She was seen on 04-23-2017 in the [facility] ED for vaginal complaints and was found to be pregnant. She was diagnosed with a pregnancy of unknown location and told to follow-up at [facility], which she did not do.  [place] at [facility] ED showed gestational sac but no YS or FP; estimate GA of [redacted] weeks. Beta at that time was 2,000.   She was diagnosed with a yeast infection and BV; is undergoing treatment. Her GC CT test was positive for gonorrhea.  History     CSN: [id number]  Arrival date and time: 04/30/17 1036   First Provider Initiated Contact with Patient 04/30/17 1104      Chief Complaint  Patient presents with  . Follow-up   Vaginal Bleeding  The patient's primary symptoms include vaginal bleeding. The patient's pertinent negatives include no genital itching, genital lesions or genital odor. This is a new problem. The current episode started in the past 7 days. The problem occurs rarely. The problem has been resolved. The pain is mild. She is pregnant. Associated symptoms include abdominal pain. Associated symptoms comments: In the past day she has felt some cramping like she's been "doing sit-ups" . She rates it as a 1/10. . The vaginal bleeding is spotting. Passing clots: one pea sized clot, the rest of her bleeding is just when she wipes. It was light pink 5 days ago and has turned brown since.  Nothing aggravates the symptoms. She has tried nothing for the symptoms.   Patient was seen on 04-23-2017 in the [facility] ED for vaginal complaints and was found to be pregnant. She was diagnosed with a pregnancy of unknown location and told to follow-up at [facility], which she did not do.  [place] at [facility] ED showed gestational sac but no YS or FP; estimate GA of [redacted] weeks. Beta at that time was 2,000.    OB History    Gravida Para Term Preterm AB Living   6 4 4  0 1     SAB TAB Ectopic Multiple Live Births   0 1 0           Past Medical History:  Diagnosis Date  . Hypertension   . Retinal detachment   . Thyroid disease    Hypothyroidism    Past Surgical History:  Procedure Laterality Date  . DILATION AND CURETTAGE OF UTERUS    . EYE SURGERY    . NO PAST SURGERIES      No family history on file.  Social History  Substance Use Topics  . Smoking status: Current Every Day Smoker    Packs/day: 0.50    Types: Cigarettes  . Smokeless tobacco: Never Used  . Alcohol use Yes    Allergies: No Known Allergies  Prescriptions Prior to Admission  Medication Sig Dispense Refill Last Dose  . acetaminophen (TYLENOL) 325 MG tablet Take 650 mg by mouth every 6 (six) hours as needed for moderate pain or headache.   04/22/2017 at 1300  . cephALEXin (KEFLEX) 500 MG capsule Take 1 capsule (500 mg total) by mouth 3 (three) times daily. 21 capsule 0   . erythromycin ophthalmic ointment Place a 1/2 inch ribbon of ointment into the lower eyelid. 1 g 0   . metroNIDAZOLE (METROGEL VAGINAL) 0.75 % vaginal gel Place 1 Applicatorful vaginally 2 (two) times daily. 70 g 0   . promethazine (PHENERGAN) 25 MG tablet Take  1 tablet (25 mg total) by mouth every 6 (six) hours as needed for nausea. 20 tablet 0     Review of Systems  HENT: Negative.   Respiratory: Negative.   Cardiovascular: Negative.   Gastrointestinal: Positive for abdominal pain.  Genitourinary: Positive for vaginal bleeding.  Neurological: Negative.   Psychiatric/Behavioral: Negative.    Physical Exam   Blood pressure (!) 151/94, pulse 66, temperature 97.6 F (36.4 C), temperature source Oral, resp. rate 16, weight 167 lb (75.8 kg), last menstrual period 03/15/2017, SpO2 100 %, unknown if currently breastfeeding.  Physical Exam  Constitutional: She is oriented to person, place, and time. She appears well-developed and well-nourished.  HENT:  Head: Normocephalic.  Neck: Normal range of motion.  Respiratory: Effort normal.  GI: Soft.   Genitourinary: Vagina normal.  Genitourinary Comments: NEFG; no blood or tissue in the vagina. No CMT, no adnexal tenderness or suprapubic tenderness.   Musculoskeletal: Normal range of motion.  Neurological: She is alert and oriented to person, place, and time.  Skin: Skin is warm and dry.  Psychiatric: She has a normal mood and affect.    MAU Course  Procedures  MDM Beta today is 20,000.  Repeat US due to new onset pain and vaginal bleeding.  Patient received 250 mg of Rocephin IM in MAU today as she had not followed up at the [facility].  US Ob Transvaginal  Result Date: 04/30/2017 CLINICAL DATA:  36 year old pregnant female presents with vaginal bleeding. Pregnancy of unknown location on obstetric scan from 1 week prior. Quantitative beta HCG [id number]. EDC by LMP: 12/20/2017, projecting to an expected gestational age of [redacted] weeks 4 days. EXAM: TRANSVAGINAL OB ULTRASOUND TECHNIQUE: Transvaginal ultrasound was performed for complete evaluation of the gestation as well as the maternal uterus, adnexal regions, and pelvic cul-de-sac. COMPARISON:  04/23/2017 obstetric scan. FINDINGS: Intrauterine gestational sac: Single intrauterine gestational sac in the left uterine cavity. Yolk sac:  Visualized. Embryo:  Visualized. Embryonic Cardiac Activity: Not Visualized on cine or M-mode. CRL:   3.4  mm   5 w 6 d                  [place] EDC: 12/25/2017 Subchorionic hemorrhage: New large perigestational bleed in the right uterine cavity involving greater than 70% of the gestational sac circumference and measuring 4.7 x 1.5 x 3.2 cm. Maternal uterus/adnexae: Right ovary measures 4.2 x 2.6 x 2.9 cm. Left ovary measures 3.2 x 2.3 x 2.5 cm and contains a corpus luteum. No suspicious ovarian or adnexal masses. No abnormal free fluid in the pelvis. Left anterior uterine body intramural 1.9 x 1.4 x 1.6 cm fibroid. Right fundal intramural 1.8 x 1.2 x 1.8 cm fibroid. IMPRESSION: 1. Single intrauterine gestation at 5 weeks 6 days by  crown-rump length. No embryonic cardiac activity detected at this time, which could be due to early gestational age. Large perigestational bleed, new since 04/23/2017, a poor prognostic factor. Findings are suspicious but not yet definitive for failed pregnancy. Recommend follow-up US in 11-14 days for definitive diagnosis. This recommendation follows SRU consensus guidelines: Diagnostic Criteria for Nonviable Pregnancy Early in the First Trimester. [employer] Med 2013; 810:1751-02. 2. Small uterine fibroids. 3. No suspicious ovarian or adnexal masses. Electronically Signed   By: Ilona Sorrel M.D.   On: 04/30/2017 15:03    Assessment and Plan   1. Threatened miscarriage in early pregnancy   2. Vaginal bleeding    2. Explained diagnosis to patient; patient verbalized understanding of  bleeding precautions.  3. Patient stable for discharge; message sent to clinic for follow-up US in two weeks.   Mervyn Skeeters Kooistra 04/30/2017, 11:24 AM

## 2017-04-30 NOTE — MAU Note (Signed)
Was seen on the 9th for a UTI, found out she was 5 wks preg.  Was told to return here for HCG level.  Started spotting on 8/12.started as red, now brownish. Some abd discomfort, like she did a bunch of sit ups.

## 2017-04-30 NOTE — Discharge Instructions (Signed)
Threatened Miscarriage °A threatened miscarriage is when you have vaginal bleeding during your first 20 weeks of pregnancy but the pregnancy has not ended. Your doctor will do tests to make sure you are still pregnant. The cause of the bleeding may not be known. This condition does not mean your pregnancy will end. It does increase the risk of it ending (complete miscarriage). °Follow these instructions at home: °· Make sure you keep all your doctor visits for prenatal care. °· Get plenty of rest. °· Do not have sex or use tampons if you have vaginal bleeding. °· Do not douche. °· Do not smoke or use drugs. °· Do not drink alcohol. °· Avoid caffeine. °Contact a doctor if: °· You have light bleeding from your vagina. °· You have belly pain or cramping. °· You have a fever. °Get help right away if: °· You have heavy bleeding from your vagina. °· You have clots of blood coming from your vagina. °· You have bad pain or cramps in your low back or belly. °· You have fever, chills, and bad belly pain. °This information is not intended to replace advice given to you by your health care provider. Make sure you discuss any questions you have with your health care provider. °Document Released: 08/14/2008 Document Revised: 02/07/2016 Document Reviewed: 06/28/2013 °Elsevier Interactive Patient Education © 2018 Elsevier Inc. ° °

## 2017-05-01 LAB — GC/CHLAMYDIA PROBE AMP (~~LOC~~) NOT AT ARMC
CHLAMYDIA, DNA PROBE: NEGATIVE
NEISSERIA GONORRHEA: NEGATIVE

## 2017-05-12 ENCOUNTER — Emergency Department (HOSPITAL_COMMUNITY): Payer: Medicaid Other

## 2017-05-12 ENCOUNTER — Encounter (HOSPITAL_COMMUNITY): Payer: Self-pay

## 2017-05-12 ENCOUNTER — Emergency Department (HOSPITAL_COMMUNITY)
Admission: EM | Admit: 2017-05-12 | Discharge: 2017-05-14 | Disposition: A | Discharge status: Home / Self Care | Payer: Medicaid Other | Attending: Emergency Medicine | Admitting: Emergency Medicine

## 2017-05-12 DIAGNOSIS — I10 Essential (primary) hypertension: Secondary | ICD-10-CM | POA: Diagnosis not present

## 2017-05-12 DIAGNOSIS — Z3480 Encounter for supervision of other normal pregnancy, unspecified trimester: Secondary | ICD-10-CM | POA: Diagnosis not present

## 2017-05-12 DIAGNOSIS — Z91411 Personal history of adult psychological abuse: Secondary | ICD-10-CM | POA: Diagnosis not present

## 2017-05-12 DIAGNOSIS — N390 Urinary tract infection, site not specified: Secondary | ICD-10-CM | POA: Insufficient documentation

## 2017-05-12 DIAGNOSIS — F329 Major depressive disorder, single episode, unspecified: Secondary | ICD-10-CM | POA: Diagnosis not present

## 2017-05-12 DIAGNOSIS — F32A Depression, unspecified: Secondary | ICD-10-CM

## 2017-05-12 DIAGNOSIS — F17210 Nicotine dependence, cigarettes, uncomplicated: Secondary | ICD-10-CM | POA: Diagnosis not present

## 2017-05-12 DIAGNOSIS — Z91410 Personal history of adult physical and sexual abuse: Secondary | ICD-10-CM | POA: Diagnosis not present

## 2017-05-12 DIAGNOSIS — O200 Threatened abortion: Secondary | ICD-10-CM | POA: Insufficient documentation

## 2017-05-12 DIAGNOSIS — R45851 Suicidal ideations: Secondary | ICD-10-CM | POA: Diagnosis present

## 2017-05-12 LAB — ETHANOL

## 2017-05-12 LAB — URINALYSIS, ROUTINE W REFLEX MICROSCOPIC
Bilirubin Urine: NEGATIVE
GLUCOSE, UA: NEGATIVE mg/dL
HGB URINE DIPSTICK: NEGATIVE
Ketones, ur: 20 mg/dL — AB
NITRITE: NEGATIVE
PROTEIN: NEGATIVE mg/dL
SPECIFIC GRAVITY, URINE: 1.024 (ref 1.005–1.030)
pH: 6 (ref 5.0–8.0)

## 2017-05-12 LAB — COMPREHENSIVE METABOLIC PANEL
ALT: 10 U/L — ABNORMAL LOW (ref 14–54)
ANION GAP: 8 (ref 5–15)
AST: 15 U/L (ref 15–41)
Albumin: 3.6 g/dL (ref 3.5–5.0)
Alkaline Phosphatase: 39 U/L (ref 38–126)
BILIRUBIN TOTAL: 0.5 mg/dL (ref 0.3–1.2)
BUN: 9 mg/dL (ref 6–20)
CHLORIDE: 106 mmol/L (ref 101–111)
CO2: 21 mmol/L — ABNORMAL LOW (ref 22–32)
Calcium: 9.2 mg/dL (ref 8.9–10.3)
Creatinine, Ser: 0.59 mg/dL (ref 0.44–1.00)
Glucose, Bld: 83 mg/dL (ref 65–99)
POTASSIUM: 3.8 mmol/L (ref 3.5–5.1)
Sodium: 135 mmol/L (ref 135–145)
TOTAL PROTEIN: 7.4 g/dL (ref 6.5–8.1)

## 2017-05-12 LAB — CBC
HCT: 33.6 % — ABNORMAL LOW (ref 36.0–46.0)
Hemoglobin: 10.5 g/dL — ABNORMAL LOW (ref 12.0–15.0)
MCH: 24.6 pg — ABNORMAL LOW (ref 26.0–34.0)
MCHC: 31.3 g/dL (ref 30.0–36.0)
MCV: 78.9 fL (ref 78.0–100.0)
PLATELETS: 298 10*3/uL (ref 150–400)
RBC: 4.26 MIL/uL (ref 3.87–5.11)
RDW: 18.8 % — AB (ref 11.5–15.5)
WBC: 6.1 10*3/uL (ref 4.0–10.5)

## 2017-05-12 LAB — RAPID URINE DRUG SCREEN, HOSP PERFORMED
Amphetamines: NOT DETECTED
BENZODIAZEPINES: NOT DETECTED
Barbiturates: NOT DETECTED
Cocaine: NOT DETECTED
OPIATES: NOT DETECTED
Tetrahydrocannabinol: POSITIVE — AB

## 2017-05-12 LAB — HCG, QUANTITATIVE, PREGNANCY: hCG, Beta Chain, Quant, S: 113703 m[IU]/mL — ABNORMAL HIGH (ref <5)

## 2017-05-12 LAB — ACETAMINOPHEN LEVEL

## 2017-05-12 LAB — SALICYLATE LEVEL

## 2017-05-12 LAB — I-STAT BETA HCG BLOOD, ED (MC, WL, AP ONLY)

## 2017-05-12 MED ORDER — ONDANSETRON HCL 4 MG PO TABS: 4.0000 mg | ORAL_TABLET | Freq: Three times a day (TID) | ORAL | Status: DC | PRN

## 2017-05-12 MED ORDER — ACETAMINOPHEN 325 MG PO TABS
650.0000 mg | ORAL_TABLET | ORAL | Status: DC | PRN
  Administered 2017-05-13: 650 mg via ORAL
  Filled 2017-05-12: qty 2

## 2017-05-12 MED ORDER — CEPHALEXIN 250 MG PO CAPS
500.0000 mg | ORAL_CAPSULE | Freq: Two times a day (BID) | ORAL | Status: DC
  Administered 2017-05-12 – 2017-05-14 (×4): 500 mg via ORAL
  Filled 2017-05-12 (×4): qty 2

## 2017-05-12 NOTE — Progress Notes (Signed)
CSW reviewed pt chart. Per Jinny Blossom, NP, pt meets criteria for inpatient hospitalization.  Pt referral packet sent to the following hospitals:  [place], [place], [facility], [place], [place].  Disposition:  CSW will continue to follow for placement.  Areatha Keas. Judi Cong, MSW, [address] Disposition Clinical Social Work [phone] (cell) [phone] (office)

## 2017-05-12 NOTE — ED Notes (Addendum)
Patient belongings was Inventory and placed in [place] [facility], w/  1 bag and 1 blue tote bag with purse inside and Valuables taken to Security.

## 2017-05-12 NOTE — ED Notes (Signed)
Called lab to add on HCG quant
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
## 2017-05-12 NOTE — ED Triage Notes (Signed)
Pt presents for evaluation of suicidal thoughts starting last night. Pt reports has had a lot of stress with starting a new job and a miscarriage that was diagnosed on 8/17. Pt denies plan for SI, denies HI. Reports is homeless.

## 2017-05-12 NOTE — ED Notes (Signed)
Pt reports while using RR when she wiped there was a large amount of bright red blood on the paper.

## 2017-05-12 NOTE — ED Provider Notes (Signed)
[facility] DEPT Provider Note   CSN: [id number] Arrival date & time: 05/12/17  1419     History   Chief Complaint Chief Complaint  Patient presents with  . Suicidal    HPI Susan Walton is a 36 y.o. female.  HPI  36 year old female presents with suicidal thoughts. Has been feeling depressed and suicidal since yesterday. She states she has a lot of stressors in her life including work and being homeless. She is currently staying with a coworker but last night had an argument about money. She is currently pregnant but states she is currently miscarrying. She's been bleeding for the last 10 days. On 8/16 she had an ultrasound that showed high concern for impending miscarriage and recommended ultrasound 11-14 days later. She states the next day after that ultrasound she started bleeding and has been bleeding since. She had dysuria earlier after that [facility] visit but then it went away and now has come back. Denies any abdominal pain. Had some nausea yesterday but none now. She's been feeling lightheaded for the last couple days and thinks her iron might be low. However she has not passed out.  Past Medical History:  Diagnosis Date  . Hypertension   . Retinal detachment   . Thyroid disease    Hypothyroidism    There are no active problems to display for this patient.   Past Surgical History:  Procedure Laterality Date  . DILATION AND CURETTAGE OF UTERUS    . EYE SURGERY    . NO PAST SURGERIES      OB History    Gravida Para Term Preterm AB Living   6 4 4  0 1     SAB TAB Ectopic Multiple Live Births   0 1 0           Home Medications    Prior to Admission medications   Medication Sig Start Date End Date Taking? Authorizing Provider  acetaminophen (TYLENOL) 325 MG tablet Take 650 mg by mouth every 6 (six) hours as needed for moderate pain or headache.    [provider]    Family History No family history on file.  Social History Social History   Substance Use Topics  . Smoking status: Current Every Day Smoker    Packs/day: 0.50    Types: Cigarettes  . Smokeless tobacco: Never Used  . Alcohol use Yes     Allergies   Patient has no known allergies.   Review of Systems Review of Systems  Gastrointestinal: Positive for nausea. Negative for abdominal pain and vomiting.  Genitourinary: Positive for dysuria and vaginal bleeding.  Neurological: Positive for light-headedness. Negative for syncope.  Psychiatric/Behavioral: Positive for dysphoric mood, sleep disturbance and suicidal ideas. Negative for self-injury.  All other systems reviewed and are negative.    Physical Exam Updated Vital Signs BP 132/82 (BP Location: Left Arm)   Pulse 81   Temp 98.4 F (36.9 C) (Oral)   Resp 18   LMP 03/15/2017   SpO2 100%   Physical Exam  Constitutional: She is oriented to person, place, and time. She appears well-developed and well-nourished. No distress.  HENT:  Head: Normocephalic and atraumatic.  Right Ear: External ear normal.  Left Ear: External ear normal.  Nose: Nose normal.  Eyes: Right eye exhibits no discharge. Left eye exhibits no discharge.  Cardiovascular: Normal rate, regular rhythm and normal heart sounds.   No murmur heard. Pulmonary/Chest: Effort normal and breath sounds normal.  Abdominal: Soft. She exhibits  no distension. There is no tenderness.  Neurological: She is alert and oriented to person, place, and time.  Skin: Skin is warm and dry. She is not diaphoretic.  Psychiatric: She is not actively hallucinating. She exhibits a depressed mood. She expresses suicidal ideation. She expresses no suicidal plans.  Tearful  Nursing note and vitals reviewed.    ED Treatments / Results  Labs (all labs ordered are listed, but only abnormal results are displayed) Labs Reviewed  COMPREHENSIVE METABOLIC PANEL - Abnormal; Notable for the following:       Result Value   CO2 21 (*)    ALT 10 (*)    All other  components within normal limits  ACETAMINOPHEN LEVEL - Abnormal; Notable for the following:    Acetaminophen (Tylenol), Serum <10 (*)    All other components within normal limits  CBC - Abnormal; Notable for the following:    Hemoglobin 10.5 (*)    HCT 33.6 (*)    MCH 24.6 (*)    RDW 18.8 (*)    All other components within normal limits  RAPID URINE DRUG SCREEN, HOSP PERFORMED - Abnormal; Notable for the following:    Tetrahydrocannabinol POSITIVE (*)    All other components within normal limits  HCG, QUANTITATIVE, PREGNANCY - Abnormal; Notable for the following:    hCG, Beta Chain, Quant, S 113,703 (*)    All other components within normal limits  URINALYSIS, ROUTINE W REFLEX MICROSCOPIC - Abnormal; Notable for the following:    APPearance HAZY (*)    Ketones, ur 20 (*)    Leukocytes, UA TRACE (*)    Bacteria, UA RARE (*)    Squamous Epithelial / LPF 0-5 (*)    All other components within normal limits  I-STAT BETA HCG BLOOD, ED ([facility], [facility], [facility] ONLY) - Abnormal; Notable for the following:    I-stat hCG, quantitative >2,000.0 (*)    All other components within normal limits  URINE CULTURE  ETHANOL  SALICYLATE LEVEL    EKG  EKG Interpretation None       Radiology US Ob Comp < 14 Wks  Result Date: 05/12/2017 CLINICAL DATA:  36 year old pregnant female presenting with vaginal bleeding. Quantitative beta HCG [id number]. EDC by LMP: 12/20/2017, projecting to an expected gestational age of [redacted] weeks 2 days. EXAM: OBSTETRIC <14 WK [place] AND TRANSVAGINAL OB US TECHNIQUE: Both transabdominal and transvaginal ultrasound examinations were performed for complete evaluation of the gestation as well as the maternal uterus, adnexal regions, and pelvic cul-de-sac. Transvaginal technique was performed to assess early pregnancy. COMPARISON:  04/30/2017 obstetric scan. FINDINGS: Intrauterine gestational sac: Single intrauterine gestational sac appears normal in size, shape and position. Yolk sac:   Visualized. Embryo:  Visualized. Embryonic Cardiac Activity: Regular rate and rhythm. Embryonic Heart Rate: 150  bpm CRL:  13.6  mm   7 w   4 d                  [place] EDC: 12/25/2017 Subchorionic hemorrhage: Large perigestational bleed in the right cavity measuring 5.7 x 2.0 x 2.8 cm and involving approximately 60-70% of the gestational sac circumference, previously 4.7 x 1.5 x 3.2 cm on 04/30/2017, overall not appreciably changed in size. Maternal uterus/adnexae: Anteverted uterus. Left anterior uterine body intramural 1.6 x 1.4 x 1.3 cm fibroid, not appreciably changed. Previously described small right fundal fibroid is not demonstrated on this scan. Right ovary measures 4.2 x 2.3 x 3.5 cm. Left ovary measures 5.0 x 3.2 x 3.7  cm and contains a 2.3 cm corpus luteum. No suspicious ovarian or adnexal masses. No abnormal free fluid in the pelvis. IMPRESSION: 1. Single living intrauterine gestation at 7 weeks 4 days by crown-rump length, with no significant discrepancy with the expected gestational age of [redacted] weeks 2 days by provided menstrual dating. Normal embryonic cardiac activity. 2. Large perigestational bleed, overall not appreciably changed in size since 04/30/2017 obstetric scan. 3. Stable small left anterior uterine body fibroid. 4. No suspicious ovarian or adnexal findings. Electronically Signed   By: Ilona Sorrel M.D.   On: 05/12/2017 17:27   US Ob Transvaginal  Result Date: 05/12/2017 CLINICAL DATA:  36 year old pregnant female presenting with vaginal bleeding. Quantitative beta HCG [id number]. EDC by LMP: 12/20/2017, projecting to an expected gestational age of [redacted] weeks 2 days. EXAM: OBSTETRIC <14 WK [place] AND TRANSVAGINAL OB US TECHNIQUE: Both transabdominal and transvaginal ultrasound examinations were performed for complete evaluation of the gestation as well as the maternal uterus, adnexal regions, and pelvic cul-de-sac. Transvaginal technique was performed to assess early pregnancy. COMPARISON:  04/30/2017  obstetric scan. FINDINGS: Intrauterine gestational sac: Single intrauterine gestational sac appears normal in size, shape and position. Yolk sac:  Visualized. Embryo:  Visualized. Embryonic Cardiac Activity: Regular rate and rhythm. Embryonic Heart Rate: 150  bpm CRL:  13.6  mm   7 w   4 d                  [place] EDC: 12/25/2017 Subchorionic hemorrhage: Large perigestational bleed in the right cavity measuring 5.7 x 2.0 x 2.8 cm and involving approximately 60-70% of the gestational sac circumference, previously 4.7 x 1.5 x 3.2 cm on 04/30/2017, overall not appreciably changed in size. Maternal uterus/adnexae: Anteverted uterus. Left anterior uterine body intramural 1.6 x 1.4 x 1.3 cm fibroid, not appreciably changed. Previously described small right fundal fibroid is not demonstrated on this scan. Right ovary measures 4.2 x 2.3 x 3.5 cm. Left ovary measures 5.0 x 3.2 x 3.7 cm and contains a 2.3 cm corpus luteum. No suspicious ovarian or adnexal masses. No abnormal free fluid in the pelvis. IMPRESSION: 1. Single living intrauterine gestation at 7 weeks 4 days by crown-rump length, with no significant discrepancy with the expected gestational age of [redacted] weeks 2 days by provided menstrual dating. Normal embryonic cardiac activity. 2. Large perigestational bleed, overall not appreciably changed in size since 04/30/2017 obstetric scan. 3. Stable small left anterior uterine body fibroid. 4. No suspicious ovarian or adnexal findings. Electronically Signed   By: Ilona Sorrel M.D.   On: 05/12/2017 17:27    Procedures Procedures (including critical care time)  Medications Ordered in ED Medications  acetaminophen (TYLENOL) tablet 650 mg (not administered)  ondansetron (ZOFRAN) tablet 4 mg (not administered)  cephALEXin (KEFLEX) capsule 500 mg (500 mg Oral Given 05/12/17 2150)     Initial Impression / Assessment and Plan / ED Course  I have reviewed the triage vital signs and the nursing notes.  Pertinent labs &  imaging results that were available during my care of the patient were reviewed by me and considered in my medical decision making (see chart for details).     Her ultrasound shows a single live intrauterine pregnancy. This is still a threatened miscarriages she is still having bleeding but at this point there is no further treatment needed. She does have some dysuria and urine is equivocal, thus will treat as a UTI. Psychiatry has evaluated and she is being recommended for inpatient  treatment. She appears medically stable for this. Awaiting placement.  Final Clinical Impressions(s) / ED Diagnoses   Final diagnoses:  Depression, unspecified depression type  Threatened miscarriage in early pregnancy    New Prescriptions New Prescriptions   No medications on file     Sherwood Gambler, MD 05/12/17 2300

## 2017-05-12 NOTE — ED Notes (Signed)
Called staffing to notify about sitter request. State no sitter at this time.

## 2017-05-13 MED ORDER — DIPHENHYDRAMINE HCL 25 MG PO CAPS
25.0000 mg | ORAL_CAPSULE | Freq: Once | ORAL | Status: AC
  Administered 2017-05-13: 25 mg via ORAL
  Filled 2017-05-13: qty 1

## 2017-05-13 MED ORDER — DOXYLAMINE SUCCINATE (SLEEP) 25 MG PO TABS: 25.0000 mg | ORAL_TABLET | Freq: Once | ORAL | Status: DC

## 2017-05-13 NOTE — BH Assessment (Signed)
Writer forward patient's information to [facility] Attending Physician (Dr. Bary Leriche). Per conversation with Dr. Bary Leriche, she will enter a note indicating her decision as to why patient not being admitted to [facility] BMU on 05/13/2017. Writer informed [place] Ria Comment).

## 2017-05-13 NOTE — ED Notes (Signed)
Shower taken c/o left lower abd. Pain, medication given. Lunch tray ordered.

## 2017-05-13 NOTE — BHH Counselor (Signed)
P)t is a 36 year old female who presented to [facility] on 05/12/17 with complaint of suicidal ideation.  Pt was re-assessed this AM.  She currently denies SI, HI, and AVH.  She indicated that she wanted to rest for a couple of days and then be discharged.  Consulted with Myrle Sheng, NP who recommended continued inpatient placement due to Pt's recent SI threat and also the fact that Pt has not been started on any psychotropic meds.

## 2017-05-13 NOTE — ED Notes (Signed)
Lunch tray given. 

## 2017-05-13 NOTE — BHH Counselor (Signed)
Clinician spoke to [facility] at [facility], pt has been declined due to medical acuity.   Vertell Novak, MS, [facility], [facility] Triage Specialist [phone]

## 2017-05-13 NOTE — ED Provider Notes (Signed)
Patient reassessed by me. Patient currently stating she's no longer suicidal. Poor have behavioral health reassess her feel that she may be candidate for discharge home at this time. Patient also with early first trimester pregnancy. Currently ultrasound shows a viable pregnancy. But there is a chorionic bleed. Patient with occasional spotting. No heavy bleeding. No significant abdominal pain. Patient's quantitative hCGs have been increasing up to this point. So this is definitely a viable pregnancy at t point in time. Patient's quantitative hCG could be rechecked at 48 hour mark and that may show change if it's increasing pregnancies developing if it is decreasing then most likely she' been having a miscarriage. Ultrasound done yesterday did show as stated above we'll intrauterine pregnancy. Patient with no significant abdominal pain. If patient is a candidate for discharge home from [facility] standpoint. She can be discharged and follow-up with GYN.   Fredia Sorrow, MD 05/13/17 1556

## 2017-05-13 NOTE — Progress Notes (Signed)
Patient ID: Kalany Diekmann, female   DOB: [birth year], 36 y.o.   MRN: [id number]   I was asked to consider this patient for admission to [facility] BMU. After careful review of her chart, I do not believe that this patient is in need of psychiatric hospitalization. The patient, in difficult social situation, came to the ER complaining of suicidal ideation. According to available information, the patient does not have a history of depression or suicidality. She is positive for cannabis only and denies suicidal ideation today. 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  Today's note: "P)t is a 36 year old female who presented to [facility] on 05/12/17 with complaint of suicidal ideation.  Pt was re-assessed this AM.  She currently denies SI, HI, and AVH.  She indicated that she wanted to rest for a couple of days and then be discharged.  Consulted with Myrle Sheng, NP who recommended continued inpatient placement due to Pt's recent SI threat and also the fact that Pt has not been started on any psychotropic meds." suggest that the patient needs to be evaluated for depressive symptoms and started on medications if appropriate.   I do not believe she meets criteria for admission.  Please let us know if the situation changes.

## 2017-05-14 DIAGNOSIS — F17210 Nicotine dependence, cigarettes, uncomplicated: Secondary | ICD-10-CM

## 2017-05-14 DIAGNOSIS — G4700 Insomnia, unspecified: Secondary | ICD-10-CM

## 2017-05-14 DIAGNOSIS — R450 Nervousness: Secondary | ICD-10-CM

## 2017-05-14 DIAGNOSIS — Z3480 Encounter for supervision of other normal pregnancy, unspecified trimester: Secondary | ICD-10-CM

## 2017-05-14 DIAGNOSIS — F419 Anxiety disorder, unspecified: Secondary | ICD-10-CM

## 2017-05-14 DIAGNOSIS — Z91410 Personal history of adult physical and sexual abuse: Secondary | ICD-10-CM | POA: Diagnosis not present

## 2017-05-14 DIAGNOSIS — F1910 Other psychoactive substance abuse, uncomplicated: Secondary | ICD-10-CM

## 2017-05-14 DIAGNOSIS — F329 Major depressive disorder, single episode, unspecified: Secondary | ICD-10-CM | POA: Diagnosis not present

## 2017-05-14 DIAGNOSIS — Z91411 Personal history of adult psychological abuse: Secondary | ICD-10-CM | POA: Diagnosis not present

## 2017-05-14 LAB — URINE CULTURE

## 2017-05-14 MED ORDER — DIPHENHYDRAMINE HCL 25 MG PO CAPS
ORAL_CAPSULE | ORAL | Status: AC
  Filled 2017-05-14: qty 2

## 2017-05-14 MED ORDER — DIPHENHYDRAMINE HCL 25 MG PO CAPS
50.0000 mg | ORAL_CAPSULE | Freq: Once | ORAL | Status: AC
  Administered 2017-05-14: 50 mg via ORAL

## 2017-05-14 NOTE — ED Notes (Signed)
TTS in progress at this time.  

## 2017-05-14 NOTE — Progress Notes (Signed)
CSW faxed homeless shelter referrals to [facility] ED, Nurse, Horris Latino.  Pt being d/c'd.  Romie Minus T. Judi Cong, MSW, [place] Disposition Clinical Social Work [phone] (cell) [phone] (office)

## 2017-05-14 NOTE — ED Provider Notes (Signed)
4:12 PM Nurse informed me that patient is ready for discharge. According to the notes and she is ready for discharge.Will discharge   Macarthur Critchley, MD 05/14/17 [phone]

## 2017-05-14 NOTE — Progress Notes (Signed)
CSW spoke with Susan Walton at [facility] regarding referring patient for OB-GYN services.   Per Susan Walton, patient has an appointment at [facility]  on 0/88/11 at 11:00am.   Patient will need to bring a picture ID, SS card/birth certificate, Medicaid card/number, prenancy test/ultra-sound (proof of pregnancy).   Horris Latino, RN notified.   CSW will chart appointment and continue to follow for disposition.    [name] MSW, [place] Disposition [phone]
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
## 2017-05-14 NOTE — Progress Notes (Signed)
Per Hughie Closs, NP, the patient does not meet criteria for inpatient treatment. The patient is recommended for discharge and to follow up with outpatient [place] and OB-GYN services.   Patient has an appointment at [facility]  on 9/50/93 at 11:00am.  Dagoberto Reef, RN notified.    [name] MSW, [place] Disposition [phone]

## 2017-05-14 NOTE — ED Notes (Signed)
Patient provided with bus pass, along with list of referrals from [facility] prior to discharge. No distress noted.

## 2017-05-19 ENCOUNTER — Inpatient Hospital Stay (HOSPITAL_COMMUNITY)
Admission: AD | Admit: 2017-05-19 | Discharge: 2017-05-19 | Disposition: A | Discharge status: Home / Self Care | Payer: Medicaid Other | Source: Ambulatory Visit | Attending: Obstetrics & Gynecology | Admitting: Obstetrics & Gynecology

## 2017-05-19 DIAGNOSIS — O161 Unspecified maternal hypertension, first trimester: Secondary | ICD-10-CM | POA: Insufficient documentation

## 2017-05-19 DIAGNOSIS — O26851 Spotting complicating pregnancy, first trimester: Secondary | ICD-10-CM | POA: Diagnosis not present

## 2017-05-19 DIAGNOSIS — Z3A08 8 weeks gestation of pregnancy: Secondary | ICD-10-CM | POA: Diagnosis not present

## 2017-05-19 DIAGNOSIS — O209 Hemorrhage in early pregnancy, unspecified: Secondary | ICD-10-CM | POA: Diagnosis present

## 2017-05-19 DIAGNOSIS — O99331 Smoking (tobacco) complicating pregnancy, first trimester: Secondary | ICD-10-CM | POA: Insufficient documentation

## 2017-05-19 NOTE — MAU Provider Note (Signed)
  History     CSN: [id number]  Arrival date and time: 05/19/17 8546   First Provider Initiated Contact with Patient 05/19/17 [phone]      No chief complaint on file.  [id number] @[redacted]w[redacted]d  here requesting an [place]. She has been seen in the ED for VB and was told she was miscarrying. She reports small amt of brown/red spotting when she wipes. No abdominal pain. She states the pregnancy is weighing on her mental health and admit to using alcohol and cocaine. She is considering termination of the pregnancy because she "doesn't know if I could handle a baby that isn't normal". Interview is difficult d/t pt drowsy and answering questions slowly.     Past Medical History:  Diagnosis Date  . Hypertension   . Retinal detachment   . Thyroid disease    Hypothyroidism    Past Surgical History:  Procedure Laterality Date  . DILATION AND CURETTAGE OF UTERUS    . EYE SURGERY    . NO PAST SURGERIES      No family history on file.  Social History  Substance Use Topics  . Smoking status: Current Every Day Smoker    Packs/day: 0.50    Types: Cigarettes  . Smokeless tobacco: Never Used  . Alcohol use Yes    Allergies: No Known Allergies  Prescriptions Prior to Admission  Medication Sig Dispense Refill Last Dose  . acetaminophen (TYLENOL) 325 MG tablet Take 650 mg by mouth every 6 (six) hours as needed for moderate pain or headache.   Not Taking at Unknown time    Review of Systems  Gastrointestinal: Negative for abdominal pain.  Genitourinary: Positive for vaginal bleeding.   Physical Exam   Blood pressure (!) 141/97, pulse 75, temperature 98.9 F (37.2 C), temperature source Oral, resp. rate 16, height 5\' 8"  (1.727 m), weight 166 lb (75.3 kg), last menstrual period 03/15/2017, SpO2 100 %, unknown if currently breastfeeding.  Physical Exam  Constitutional: She is oriented to person, place, and time. She appears well-developed and well-nourished. No distress.  HENT:  Head: Normocephalic.   Cardiovascular: Normal rate.   Respiratory: Effort normal.  Musculoskeletal: Normal range of motion.  Neurological: She is oriented to person, place, and time.    MAU Course  Procedures  MDM Interview difficult which makes me question recent drug use. Review of charts shows last [place] 1 week ago found large [facility] but viable IUP @[redacted]w[redacted]d . Provider note states threatened miscarriage but viable IUP, indicating she may have misunderstood what he told her. Given her report of minimal spotting it is unlikely there is a change in the pregnancy and I don't see an indication for repeating US today. I informed pt that we do not offer terminations here but that is an option elsewhere if she chooses and there is time restraints associated. She states she wants to follow up at [facility] to discuss her options further. I reviewed with her to return to MAU for increased bleeding, passing clots or tissue, or pain.   Assessment and Plan  [redacted] weeks gestation Discharge home Follow up with [facility] Return precautions  Julianne Handler, CNM 05/19/2017, 6:48 AM

## 2017-05-19 NOTE — MAU Note (Signed)
Pt states she is here for ultrasound because she was told she was having a miscarriage. Pt wants to know what her next step is to "complete the process." Pt denies pain and has minimal vaginal bleeding. Pt is very drowsy in triage. States she took her promethazine at 8pm last night and that's her reasoning for being drowsy.

## 2017-06-05 ENCOUNTER — Encounter (HOSPITAL_COMMUNITY): Payer: Self-pay

## 2017-06-05 ENCOUNTER — Emergency Department (HOSPITAL_COMMUNITY)
Admission: EM | Admit: 2017-06-05 | Discharge: 2017-06-05 | Disposition: A | Discharge status: Home / Self Care | Payer: Medicaid Other | Attending: Emergency Medicine | Admitting: Emergency Medicine

## 2017-06-05 DIAGNOSIS — F17210 Nicotine dependence, cigarettes, uncomplicated: Secondary | ICD-10-CM | POA: Insufficient documentation

## 2017-06-05 DIAGNOSIS — Z3A00 Weeks of gestation of pregnancy not specified: Secondary | ICD-10-CM | POA: Diagnosis not present

## 2017-06-05 DIAGNOSIS — E039 Hypothyroidism, unspecified: Secondary | ICD-10-CM | POA: Diagnosis not present

## 2017-06-05 DIAGNOSIS — F1910 Other psychoactive substance abuse, uncomplicated: Secondary | ICD-10-CM | POA: Diagnosis not present

## 2017-06-05 DIAGNOSIS — Z79899 Other long term (current) drug therapy: Secondary | ICD-10-CM | POA: Diagnosis not present

## 2017-06-05 DIAGNOSIS — Z5900 Homelessness unspecified: Secondary | ICD-10-CM

## 2017-06-05 DIAGNOSIS — O26891 Other specified pregnancy related conditions, first trimester: Secondary | ICD-10-CM | POA: Diagnosis not present

## 2017-06-05 DIAGNOSIS — F4321 Adjustment disorder with depressed mood: Secondary | ICD-10-CM | POA: Diagnosis not present

## 2017-06-05 DIAGNOSIS — I10 Essential (primary) hypertension: Secondary | ICD-10-CM | POA: Insufficient documentation

## 2017-06-05 LAB — COMPREHENSIVE METABOLIC PANEL
ALT: 10 U/L — ABNORMAL LOW (ref 14–54)
AST: 16 U/L (ref 15–41)
Albumin: 3.8 g/dL (ref 3.5–5.0)
Alkaline Phosphatase: 32 U/L — ABNORMAL LOW (ref 38–126)
Anion gap: 8 (ref 5–15)
BUN: 7 mg/dL (ref 6–20)
CO2: 22 mmol/L (ref 22–32)
Calcium: 8.9 mg/dL (ref 8.9–10.3)
Chloride: 105 mmol/L (ref 101–111)
Creatinine, Ser: 0.56 mg/dL (ref 0.44–1.00)
GFR calc Af Amer: >60 mL/min (ref >60)
GFR calc non Af Amer: >60 mL/min (ref >60)
Glucose, Bld: 77 mg/dL (ref 65–99)
Potassium: 3.3 mmol/L — ABNORMAL LOW (ref 3.5–5.1)
Sodium: 135 mmol/L (ref 135–145)
Total Bilirubin: 0.2 mg/dL — ABNORMAL LOW (ref 0.3–1.2)
Total Protein: 7.4 g/dL (ref 6.5–8.1)

## 2017-06-05 LAB — CBC
HCT: 33.4 % — ABNORMAL LOW (ref 36.0–46.0)
Hemoglobin: 11 g/dL — ABNORMAL LOW (ref 12.0–15.0)
MCH: 26.6 pg (ref 26.0–34.0)
MCHC: 32.9 g/dL (ref 30.0–36.0)
MCV: 80.9 fL (ref 78.0–100.0)
Platelets: 261 10*3/uL (ref 150–400)
RBC: 4.13 MIL/uL (ref 3.87–5.11)
RDW: 19.7 % — ABNORMAL HIGH (ref 11.5–15.5)
WBC: 7.4 10*3/uL (ref 4.0–10.5)

## 2017-06-05 LAB — RAPID URINE DRUG SCREEN, HOSP PERFORMED
Amphetamines: POSITIVE — AB
Barbiturates: NOT DETECTED
Benzodiazepines: NOT DETECTED
Cocaine: NOT DETECTED
Opiates: NOT DETECTED
Tetrahydrocannabinol: POSITIVE — AB

## 2017-06-05 LAB — RAPID HIV SCREEN (HIV 1/2 AB+AG)
HIV 1/2 Antibodies: NONREACTIVE
HIV-1 P24 Antigen - HIV24: NONREACTIVE

## 2017-06-05 LAB — PREGNANCY, URINE: PREG TEST UR: POSITIVE — AB

## 2017-06-05 LAB — ETHANOL: Alcohol, Ethyl (B): <5 mg/dL (ref <5)

## 2017-06-05 MED ORDER — PRENATAL MULTIVITAMIN CH
1.0000 | ORAL_TABLET | Freq: Every day | ORAL | Status: DC
  Administered 2017-06-05: 1 via ORAL
  Filled 2017-06-05: qty 1

## 2017-06-05 MED ORDER — ONDANSETRON HCL 4 MG PO TABS: 4.0000 mg | ORAL_TABLET | Freq: Three times a day (TID) | ORAL | Status: DC | PRN

## 2017-06-05 MED ORDER — ACETAMINOPHEN 325 MG PO TABS: 650.0000 mg | ORAL_TABLET | ORAL | Status: DC | PRN

## 2017-06-05 NOTE — BH Assessment (Signed)
Per [name] and Jinny Blossom, NP, patient is ok to discharge. Patient is psychiatrically cleared to follow up with the hospital LCSW in addition to mental health services ([place]). Writer contacted the hospital LCSW [facility]) and requested for her see patient and assist with psychosocial needs. Writer has provided patient with mental health referrals.
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
## 2017-06-05 NOTE — ED Notes (Signed)
Bed: [facility] Expected date:  Expected time:  Means of arrival:  Comments: 
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
## 2017-06-05 NOTE — ED Notes (Signed)
Bed: [facility] Expected date:  Expected time:  Means of arrival:  Comments: Tr3

## 2017-06-05 NOTE — Progress Notes (Signed)
06/05/17 1334:  LRT went to pt room to offer activities, pt was sleep.   Victorino Sparrow, LRT/CTRS

## 2017-06-05 NOTE — ED Notes (Signed)
Pt also admits to vaginal itching and a foul odor.
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
## 2017-06-05 NOTE — ED Notes (Signed)
Pt discharged home. Discharged instructions read to pt who verbalized understanding. All belongings returned to pt who signed for same. Denies SI/HI, is not delusional and not responding to internal stimuli. Escorted pt to the ED exit.    

## 2017-06-05 NOTE — Clinical Social Work Note (Signed)
Clinical Social Work Assessment  Patient Details  Name: Susan Walton MRN: [id number] Date of Birth: 1981-08-08  Date of referral:  06/05/17               Reason for consult:  Facility Placement                Permission sought to share information with:  Facility Art therapist granted to share information::  Yes, Verbal Permission Granted  Name::        Agency::     Relationship::     Contact Information:     Housing/Transportation Living arrangements for the past 2 months:  Hotel/Motel Source of Information:  Patient Patient Interpreter Needed:  None Criminal Activity/Legal Involvement Pertinent to Current Situation/Hospitalization:    Significant Relationships:  None Lives with:  Self Do you feel safe going back to the place where you live?  Yes Need for family participation in patient care:  No (Coment)  Care giving concerns:  None listed by pt/family    Social Worker assessment / plan:  CSW met with pt and confirmed pt's plan to be discharged to her motel to live at discharge.  CSW provided active listening and validated pt's concerns.   CSW met with pt to offer resources initally pt refused but as session progressed pt accepted  meeting schedules for area Alcoholics Anonymous and Narcotics Anonymous 12-Step meetings as well as inpatient/outpatient SA Tx resources.  CSW provided education to the pt as to the efficacy of 12-step programs for community support for those needing support in addition to or other than inpatient/outpatient treatment and provided pt with list of [employer] in [place] and educated the pt on making contact, assessing for open beds and requesting and interview/assessment for admission. Pt appreciated CSW's efforts and thanked the CSW.  Pt has been living independently prior to being admitted to [facility]  Employment status:  Unemployed Insurance information:  Medicaid In [place] PT Recommendations:  Not assessed at this time Information  / Referral to community resources:     Patient/Family's Response to care:  Pt was appreciative and thanked the CSW.  Patient/Family's Understanding of and Emotional Response to Diagnosis, Current Treatment, and Prognosis:  Still assessing  Emotional Assessment Appearance:  Appears stated age Attitude/Demeanor/Rapport:    Affect (typically observed):  Accepting, Adaptable, Pleasant, Calm Orientation:  Oriented to Situation, Oriented to  Time, Oriented to Place, Oriented to Self Alcohol / Substance use:  Alcohol Use, Illicit Drugs Psych involvement (Current and /or in the community):     Discharge Needs  Concerns to be addressed:  Substance Abuse Concerns Readmission within the last 30 days:  Yes Current discharge risk:  None Barriers to Discharge:  No Barriers Identified   Claudine Mouton, LCSWA 06/05/2017, 4:19 PM

## 2017-06-15 LAB — CYSTIC FIBROSIS DIAGNOSTIC STUDY: Interpretation-CFDNA:: NEGATIVE

## 2017-06-15 LAB — OB RESULTS CONSOLE VARICELLA ZOSTER ANTIBODY, IGG: VARICELLA IGG: IMMUNE

## 2017-06-15 LAB — CYTOLOGY - PAP: Pap: NEGATIVE

## 2017-06-15 LAB — GLUCOSE TOLERANCE, 1 HOUR: GLUCOSE 1 HOUR: 81

## 2017-06-15 LAB — OB RESULTS CONSOLE ABO/RH: RH Type: POSITIVE

## 2017-06-15 LAB — OB RESULTS CONSOLE RUBELLA ANTIBODY, IGM: RUBELLA: IMMUNE

## 2017-06-15 LAB — OB RESULTS CONSOLE HEPATITIS B SURFACE ANTIGEN: HEP B S AG: NEGATIVE

## 2017-06-15 LAB — OB RESULTS CONSOLE ANTIBODY SCREEN: ANTIBODY SCREEN: NEGATIVE

## 2017-06-15 LAB — CULTURE, OB URINE: URINE CULTURE, OB: NEGATIVE

## 2017-06-16 ENCOUNTER — Other Ambulatory Visit (HOSPITAL_COMMUNITY): Payer: Self-pay | Admitting: Nurse Practitioner

## 2017-06-16 DIAGNOSIS — Z3682 Encounter for antenatal screening for nuchal translucency: Secondary | ICD-10-CM

## 2017-06-24 ENCOUNTER — Other Ambulatory Visit (HOSPITAL_COMMUNITY): Payer: Self-pay | Admitting: Nurse Practitioner

## 2017-06-24 ENCOUNTER — Ambulatory Visit (HOSPITAL_COMMUNITY)
Admission: RE | Admit: 2017-06-24 | Discharge: 2017-06-24 | Disposition: A | Discharge status: Home / Self Care | Payer: Medicaid Other | Source: Ambulatory Visit | Attending: Nurse Practitioner | Admitting: Nurse Practitioner

## 2017-06-24 ENCOUNTER — Encounter (HOSPITAL_COMMUNITY): Payer: Self-pay

## 2017-06-24 DIAGNOSIS — F1910 Other psychoactive substance abuse, uncomplicated: Secondary | ICD-10-CM | POA: Insufficient documentation

## 2017-06-24 DIAGNOSIS — O09521 Supervision of elderly multigravida, first trimester: Secondary | ICD-10-CM | POA: Diagnosis not present

## 2017-06-24 DIAGNOSIS — O99281 Endocrine, nutritional and metabolic diseases complicating pregnancy, first trimester: Secondary | ICD-10-CM

## 2017-06-24 DIAGNOSIS — Z3A13 13 weeks gestation of pregnancy: Secondary | ICD-10-CM | POA: Insufficient documentation

## 2017-06-24 DIAGNOSIS — O09522 Supervision of elderly multigravida, second trimester: Secondary | ICD-10-CM

## 2017-06-24 DIAGNOSIS — F1210 Cannabis abuse, uncomplicated: Secondary | ICD-10-CM

## 2017-06-24 DIAGNOSIS — Z3682 Encounter for antenatal screening for nuchal translucency: Secondary | ICD-10-CM | POA: Diagnosis present

## 2017-06-24 DIAGNOSIS — F1010 Alcohol abuse, uncomplicated: Secondary | ICD-10-CM

## 2017-06-24 DIAGNOSIS — O99320 Drug use complicating pregnancy, unspecified trimester: Secondary | ICD-10-CM | POA: Diagnosis not present

## 2017-06-24 DIAGNOSIS — O99280 Endocrine, nutritional and metabolic diseases complicating pregnancy, unspecified trimester: Secondary | ICD-10-CM | POA: Insufficient documentation

## 2017-06-24 DIAGNOSIS — E0590 Thyrotoxicosis, unspecified without thyrotoxic crisis or storm: Secondary | ICD-10-CM | POA: Diagnosis not present

## 2017-06-24 DIAGNOSIS — O161 Unspecified maternal hypertension, first trimester: Secondary | ICD-10-CM

## 2017-06-24 DIAGNOSIS — O99311 Alcohol use complicating pregnancy, first trimester: Secondary | ICD-10-CM

## 2017-06-24 HISTORY — DX: Anemia, unspecified: D64.9

## 2017-06-24 HISTORY — DX: Benign neoplasm of connective and other soft tissue, unspecified: D21.9

## 2017-06-24 NOTE — Progress Notes (Signed)
Appointment Date: 06/24/2017 DOB: 1981/07/03 Referring Provider: Jolaine Click, NP Attending: Renella Cunas, MD  [name] was seen for genetic counseling because of a maternal age of 36 y.o..    In summary:  Discussed AMA and associated risk for fetal aneuploidy  Discussed options for screening  First screen  Quad screen  NIPS  Ultrasound  Discussed diagnostic testing options  Amniocentesis  Reviewed family history concerns  Maternal half brother with cleft lip and palate  Declined to provide information regarding the father of baby's family history  Discussed carrier screening options - declined  CF  SMA  Hemoglobinopathies  She was counseled regarding maternal age and the association with risk for chromosome conditions due to nondisjunction with aging of the ova.   We reviewed chromosomes, nondisjunction, and the associated 1 in 114 risk for fetal aneuploidy related to a maternal age of 36 y.o. at [redacted]w[redacted]d gestation.  She was counseled that the risk for aneuploidy decreases as gestational age increases, accounting for those pregnancies which spontaneously abort.  We specifically discussed Down syndrome (trisomy 24), trisomies 78 and 38, and sex chromosome aneuploidies (47,XXX and 47,XXY) including the common features and prognoses of each.   We reviewed available screening options including First Screen, Quad screen, noninvasive prenatal screening (NIPS)/cell free DNA (cfDNA) screening, and detailed ultrasound.  She was counseled that screening tests are used to modify a patient's a priori risk for aneuploidy, typically based on age. This estimate provides a pregnancy specific risk assessment. We reviewed the benefits and limitations of each option. Specifically, we discussed the conditions for which each test screens, the detection rates, and false positive rates of each. She was also counseled regarding diagnostic testing via amniocentesis. We reviewed the  approximate 1 in 062-376 risk for complications from amniocentesis, including spontaneous pregnancy loss. We discussed the possible results that the tests might provide including: positive, negative, unanticipated, and no result.  After consideration of all the options, she elected to proceed with NIPS.  Those results will be available in 8-10 days.    A first trimester ultrasound was performed today.  The report will be documented separately.   Susan Walton was provided with written information regarding cystic fibrosis (CF), spinal muscular atrophy (SMA) and hemoglobinopathies including the carrier frequency, availability of carrier screening and prenatal diagnosis if indicated.  In addition, we discussed that CF and hemoglobinopathies are routinely screened for as part of the [place] newborn screening panel.  After further discussion, she declined screening for CF, SMA and hemoglobinopathies.  Both family histories were reviewed and found to be noncontributory for birth defects, intellectual disability, and known genetic conditions.  Susan Walton declined to provide information regarding the father of the baby or his family history.  Without further information regarding the provided family history, an accurate genetic risk cannot be calculated. Further genetic counseling is warranted if more information is obtained.  Susan Walton denied exposure to environmental toxins or chemical agents. She denied the use of alcohol, tobacco or street drugs. Her medical records suggest use of alcohol, cocaine and marijuana, but she specifically denies usage.  She denied significant viral illnesses during the course of her pregnancy.  I counseled this couple regarding the above risks and available options.  The approximate face-to-face time with the genetic counselor was 45 minutes.  Susan Hai, MS,  Certified Genetic Counselor

## 2017-07-03 ENCOUNTER — Telehealth (HOSPITAL_COMMUNITY): Payer: Self-pay | Admitting: Genetics

## 2017-07-03 NOTE — Telephone Encounter (Signed)
Called Ms. Streett to review the results of her cell free DNA screen (Panorama).  She did not answer her phone and her voicemail was full, so a message could not be left.

## 2017-07-07 ENCOUNTER — Other Ambulatory Visit (HOSPITAL_COMMUNITY): Payer: Self-pay

## 2017-07-11 ENCOUNTER — Encounter (HOSPITAL_COMMUNITY): Payer: Self-pay | Admitting: Emergency Medicine

## 2017-07-11 ENCOUNTER — Ambulatory Visit (HOSPITAL_COMMUNITY)
Admission: EM | Admit: 2017-07-11 | Discharge: 2017-07-11 | Disposition: A | Discharge status: Home / Self Care | Payer: Medicaid Other | Attending: Family Medicine | Admitting: Family Medicine

## 2017-07-11 DIAGNOSIS — Z87891 Personal history of nicotine dependence: Secondary | ICD-10-CM | POA: Insufficient documentation

## 2017-07-11 DIAGNOSIS — F4321 Adjustment disorder with depressed mood: Secondary | ICD-10-CM | POA: Insufficient documentation

## 2017-07-11 DIAGNOSIS — Z3A13 13 weeks gestation of pregnancy: Secondary | ICD-10-CM | POA: Insufficient documentation

## 2017-07-11 DIAGNOSIS — Z79899 Other long term (current) drug therapy: Secondary | ICD-10-CM | POA: Diagnosis not present

## 2017-07-11 DIAGNOSIS — E039 Hypothyroidism, unspecified: Secondary | ICD-10-CM | POA: Insufficient documentation

## 2017-07-11 DIAGNOSIS — O99281 Endocrine, nutritional and metabolic diseases complicating pregnancy, first trimester: Secondary | ICD-10-CM | POA: Insufficient documentation

## 2017-07-11 DIAGNOSIS — O99341 Other mental disorders complicating pregnancy, first trimester: Secondary | ICD-10-CM | POA: Insufficient documentation

## 2017-07-11 DIAGNOSIS — O161 Unspecified maternal hypertension, first trimester: Secondary | ICD-10-CM | POA: Diagnosis not present

## 2017-07-11 DIAGNOSIS — O09521 Supervision of elderly multigravida, first trimester: Secondary | ICD-10-CM | POA: Insufficient documentation

## 2017-07-11 DIAGNOSIS — N898 Other specified noninflammatory disorders of vagina: Secondary | ICD-10-CM

## 2017-07-11 DIAGNOSIS — O2341 Unspecified infection of urinary tract in pregnancy, first trimester: Secondary | ICD-10-CM | POA: Diagnosis present

## 2017-07-11 LAB — POCT URINALYSIS DIP (DEVICE)
Bilirubin Urine: NEGATIVE
Glucose, UA: NEGATIVE mg/dL
Hgb urine dipstick: NEGATIVE
KETONES UR: NEGATIVE mg/dL
Nitrite: NEGATIVE
PH: 7 (ref 5.0–8.0)
PROTEIN: NEGATIVE mg/dL
SPECIFIC GRAVITY, URINE: 1.02 (ref 1.005–1.030)
Urobilinogen, UA: 0.2 mg/dL (ref 0.0–1.0)

## 2017-07-11 MED ORDER — NITROFURANTOIN MONOHYD MACRO 100 MG PO CAPS: 100.0000 mg | ORAL_CAPSULE | Freq: Two times a day (BID) | ORAL | 0 refills | Status: DC

## 2017-07-11 NOTE — ED Provider Notes (Signed)
[facility]    CSN: [id number] Arrival date & time: 07/11/17  1616     History   Chief Complaint Chief Complaint  Patient presents with  . Recurrent UTI    HPI Susan Walton is a 36 y.o. female.   Subjective:  Susan Walton is a 36 y.o. female who complains of abnormal smelling urine, burning with urination, frequency and suprapubic pressure. Patient also complains of back pain, green/white vaginal discharge and dyspareunia. Patient denies any fever.  Patient does have a history of recurrent UTI.  Patient does not have a history of pyelonephritis. She is currently pregnant and reportedly due 4/12 by ultrasound. G8P4. Notably, the patient reports being treated earlier this month for BV.            Past Medical History:  Diagnosis Date  . Anemia   . Fibroid   . Hypertension   . Retinal detachment   . Thyroid disease    Hypothyroidism    Patient Active Problem List   Diagnosis Date Noted  . [redacted] weeks gestation of pregnancy   . Advanced maternal age in multigravida, second trimester   . Adjustment disorder with depressed mood 06/05/2017    Past Surgical History:  Procedure Laterality Date  . DILATION AND CURETTAGE OF UTERUS    . EYE SURGERY    . NO PAST SURGERIES      OB History    Gravida Para Term Preterm AB Living   8 4 4  0 3     SAB TAB Ectopic Multiple Live Births   2 1 0           Home Medications    Prior to Admission medications   Medication Sig Start Date End Date Taking? Authorizing Provider  Loratadine-Pseudoephedrine (LORATADINE-D 24HR PO) Take 1 tablet by mouth daily.   Yes [provider]  Prenatal Vit-Fe Fumarate-FA (NAT-RUL PRENATAL VITAMINS) 28-0.8 MG TABS Take 1 tablet by mouth daily.   Yes [provider]  acetaminophen (TYLENOL) 325 MG tablet Take 650 mg by mouth every 6 (six) hours as needed for moderate pain or headache.    [provider]  IRON PO Take by mouth.    [provider]    Promethazine HCl (PHENERGAN PO) Take by mouth.    [provider]    Family History No family history on file.  Social History Social History  Substance Use Topics  . Smoking status: Former Smoker    Packs/day: 0.50    Types: Cigarettes  . Smokeless tobacco: Never Used  . Alcohol use Yes     Comment: quit mth after +UPT     Allergies   Patient has no known allergies.   Review of Systems Review of Systems  Genitourinary: Positive for dyspareunia, dysuria, frequency and vaginal discharge.  Musculoskeletal: Positive for back pain.  All other systems reviewed and are negative.    Physical Exam Triage Vital Signs ED Triage Vitals  Enc Vitals Group     BP 07/11/17 1636 (!) 143/93     Pulse Rate 07/11/17 1636 87     Resp 07/11/17 1636 18     Temp 07/11/17 1636 98.2 F (36.8 C)     Temp Source 07/11/17 1636 Oral     SpO2 07/11/17 1636 100 %     Weight --      Height --      Head Circumference --      Peak Flow --  Pain Score 07/11/17 1635 7     Pain Loc --      Pain Edu? --      Excl. in [place]? --    No data found.   Updated Vital Signs BP (!) 143/93 (BP Location: Left Arm)   Pulse 87   Temp 98.2 F (36.8 C) (Oral)   Resp 18   LMP 03/15/2017   SpO2 100%   Visual Acuity Right Eye Distance:   Left Eye Distance:   Bilateral Distance:    Right Eye Near:   Left Eye Near:    Bilateral Near:     Physical Exam  Constitutional: She is oriented to person, place, and time. She appears well-developed and well-nourished.  Neck: Normal range of motion.  Cardiovascular: Normal rate and regular rhythm.   Pulmonary/Chest: Effort normal and breath sounds normal.  Genitourinary:  Genitourinary Comments: Female chaperone present. External genitalia without erythema, exudate or discharge. Vaginal vault with milky white discharge. Cervix is of normal color and without any lesions. The cervical os is closed. No bleeding noted. Uterus is noted to be of normal  size and nontender. No cervical motion tenderness. No palpable masses. The adnexa are without any masses or tenderness.   Musculoskeletal: Normal range of motion.  Neurological: She is alert and oriented to person, place, and time.  Skin: Skin is warm.  Psychiatric: She has a normal mood and affect.     UC Treatments / Results  Labs (all labs ordered are listed, but only abnormal results are displayed) Labs Reviewed  POCT URINALYSIS DIP (DEVICE) - Abnormal; Notable for the following:       Result Value   Leukocytes, UA LARGE (*)    All other components within normal limits  URINE CULTURE  CERVICOVAGINAL ANCILLARY ONLY    EKG  EKG Interpretation None       Radiology No results found.  Procedures Procedures (including critical care time)  Medications Ordered in UC Medications - No data to display   Initial Impression / Assessment and Plan / UC Course  I have reviewed the triage vital signs and the nursing notes.  Pertinent labs & imaging results that were available during my care of the patient were reviewed by me and considered in my medical decision making (see chart for details).    36 yo G64P4 female, currently pregnant and due 4/12 by ultrasound, that presents with abnormal smelling urine, burning with urination, frequency, suprapubic pressure, low back pain, green/white vaginal discharge and dyspareunia. UA consistent with UTI. Will treat with nitrofurantoin (pregnancy class B). Urine cultures pending. Vaginal specimen sent for CG/Chlamydia, trichomonas, yeast and BV. We will notify patient of results once they are available. Patient advised to maintain adequate hydration and to follow up with her OBGYN if symptoms are not improving.   Discussed diagnosis and treatment with patient. All questions have been answered and all concerns have been addressed. The patient verbalized understanding and had no further questions   Final Clinical Impressions(s) / UC Diagnoses    Final diagnoses:  Urinary tract infection in mother during first trimester of pregnancy    New Prescriptions New Prescriptions   No medications on file     Controlled Substance Prescriptions [place] Controlled Substance Registry consulted? Not Applicable   Enrique Sack, [place] 07/11/17 1710

## 2017-07-11 NOTE — ED Notes (Signed)
Dirty and clean urine obtained

## 2017-07-11 NOTE — Discharge Instructions (Signed)
Start antibiotic for UTI. We will notify you of the results of the other tests that was done today. Drink plenty of fluids. Follow-up with your doctor if symptoms are not improving.

## 2017-07-11 NOTE — ED Triage Notes (Signed)
Treated for bv 10/8.  Says she has a yeast infection and urinary tract infction.  Patient has pain with sex, pain in back.  Patient had green discharge and then clear  Patient also concerned for uri.

## 2017-07-12 LAB — URINE CULTURE

## 2017-07-13 ENCOUNTER — Encounter: Payer: Self-pay | Admitting: *Deleted

## 2017-07-13 ENCOUNTER — Encounter: Payer: Self-pay | Admitting: Obstetrics and Gynecology

## 2017-07-13 LAB — CERVICOVAGINAL ANCILLARY ONLY
Bacterial vaginitis: NEGATIVE
CHLAMYDIA, DNA PROBE: NEGATIVE
Candida vaginitis: POSITIVE — AB
Neisseria Gonorrhea: NEGATIVE
TRICH (WINDOWPATH): NEGATIVE

## 2017-07-14 ENCOUNTER — Telehealth (HOSPITAL_COMMUNITY): Payer: Self-pay | Admitting: Internal Medicine

## 2017-07-14 MED ORDER — TERCONAZOLE 0.4 % VA CREA: 1.0000 | TOPICAL_CREAM | Freq: Every day | VAGINAL | 0 refills | Status: AC

## 2017-07-14 NOTE — Telephone Encounter (Signed)
Clinical staff, please let patient know that test for candida (yeast) was positive.  Rx terconazole vaginal cream was sent to the pharmacy of record, [facility] at [facility].   Urine culture does not demonstrate a UTI.   Recheck or followup with OB or PCP for further evaluation if symptoms are not improving.  LM

## 2017-07-15 ENCOUNTER — Telehealth (HOSPITAL_COMMUNITY): Payer: Self-pay | Admitting: Genetics

## 2017-07-15 NOTE — Telephone Encounter (Signed)
Called Susan Walton to review results of her Panorama screen.  This is the 5th attempt to relay these results to [name]. Each time, there is no answer and her voicemail is full.

## 2017-07-27 ENCOUNTER — Ambulatory Visit (INDEPENDENT_AMBULATORY_CARE_PROVIDER_SITE_OTHER): Payer: Medicaid Other | Admitting: Obstetrics and Gynecology

## 2017-07-27 ENCOUNTER — Encounter: Payer: Self-pay | Admitting: Obstetrics and Gynecology

## 2017-07-27 ENCOUNTER — Other Ambulatory Visit (HOSPITAL_COMMUNITY)
Admission: RE | Admit: 2017-07-27 | Discharge: 2017-07-27 | Disposition: A | Discharge status: Home / Self Care | Payer: Medicaid Other | Source: Ambulatory Visit | Attending: Obstetrics and Gynecology | Admitting: Obstetrics and Gynecology

## 2017-07-27 VITALS — BP 128/87 | HR 100 | Wt 178.2 lb

## 2017-07-27 DIAGNOSIS — O99322 Drug use complicating pregnancy, second trimester: Secondary | ICD-10-CM | POA: Diagnosis not present

## 2017-07-27 DIAGNOSIS — Z202 Contact with and (suspected) exposure to infections with a predominantly sexual mode of transmission: Secondary | ICD-10-CM | POA: Insufficient documentation

## 2017-07-27 DIAGNOSIS — F1910 Other psychoactive substance abuse, uncomplicated: Secondary | ICD-10-CM

## 2017-07-27 DIAGNOSIS — O10919 Unspecified pre-existing hypertension complicating pregnancy, unspecified trimester: Secondary | ICD-10-CM

## 2017-07-27 DIAGNOSIS — O99320 Drug use complicating pregnancy, unspecified trimester: Secondary | ICD-10-CM

## 2017-07-27 DIAGNOSIS — O119 Pre-existing hypertension with pre-eclampsia, unspecified trimester: Secondary | ICD-10-CM | POA: Insufficient documentation

## 2017-07-27 DIAGNOSIS — O99282 Endocrine, nutritional and metabolic diseases complicating pregnancy, second trimester: Secondary | ICD-10-CM

## 2017-07-27 DIAGNOSIS — O0992 Supervision of high risk pregnancy, unspecified, second trimester: Secondary | ICD-10-CM

## 2017-07-27 DIAGNOSIS — Z113 Encounter for screening for infections with a predominantly sexual mode of transmission: Secondary | ICD-10-CM | POA: Diagnosis not present

## 2017-07-27 DIAGNOSIS — O99280 Endocrine, nutritional and metabolic diseases complicating pregnancy, unspecified trimester: Secondary | ICD-10-CM

## 2017-07-27 DIAGNOSIS — Z3009 Encounter for other general counseling and advice on contraception: Secondary | ICD-10-CM | POA: Insufficient documentation

## 2017-07-27 DIAGNOSIS — O09522 Supervision of elderly multigravida, second trimester: Secondary | ICD-10-CM | POA: Diagnosis not present

## 2017-07-27 DIAGNOSIS — O10912 Unspecified pre-existing hypertension complicating pregnancy, second trimester: Secondary | ICD-10-CM | POA: Diagnosis not present

## 2017-07-27 DIAGNOSIS — Z8619 Personal history of other infectious and parasitic diseases: Secondary | ICD-10-CM | POA: Diagnosis not present

## 2017-07-27 DIAGNOSIS — E079 Disorder of thyroid, unspecified: Secondary | ICD-10-CM | POA: Diagnosis not present

## 2017-07-27 DIAGNOSIS — O0990 Supervision of high risk pregnancy, unspecified, unspecified trimester: Secondary | ICD-10-CM | POA: Insufficient documentation

## 2017-07-27 LAB — POCT URINALYSIS DIP (DEVICE)
Bilirubin Urine: NEGATIVE
GLUCOSE, UA: NEGATIVE mg/dL
Hgb urine dipstick: NEGATIVE
KETONES UR: NEGATIVE mg/dL
LEUKOCYTES UA: NEGATIVE
Nitrite: NEGATIVE
Protein, ur: NEGATIVE mg/dL
SPECIFIC GRAVITY, URINE: 1.02 (ref 1.005–1.030)
UROBILINOGEN UA: 0.2 mg/dL (ref 0.0–1.0)
pH: 7.5 (ref 5.0–8.0)

## 2017-07-27 MED ORDER — ASPIRIN EC 81 MG PO TBEC: 81.0000 mg | DELAYED_RELEASE_TABLET | Freq: Every day | ORAL | 2 refills | Status: DC

## 2017-07-27 NOTE — Progress Notes (Addendum)
New ob packet given  Anatomy US scheduled for November 23rd @ 1430.  Pt notified.  Home Medicaid Form Completed

## 2017-07-27 NOTE — Addendum Note (Signed)
Addended by: Michel Harrow on: 07/27/2017 08:06 PM   Modules accepted: Orders

## 2017-07-27 NOTE — Progress Notes (Signed)
Subjective:  Susan Walton is a 36 y.o. [id number] at [redacted]w[redacted]d being seen today for ongoing prenatal care. She is transfer from the [facility] d/t to [facility] and Subclinical hyperthyroidism. She has never been on any meds for either condition. H/O polysubstance abuse as well.  Last UDS negative for cocaine and EtOH but + for marijuana on 06/16/17. Pt reports smokes marijuana daily. H/O SI as well. Denies today. She is currently monitored for the following issues for this high-risk pregnancy and has Adjustment disorder with depressed mood; Advanced maternal age in multigravida, second trimester; Pregnancy, supervision, high-risk; Chronic hypertension affecting pregnancy; Drug abuse during pregnancy ([place]); Thyroid dysfunction, antepartum; History of trichomoniasis; and Unwanted fertility on their problem list.  Patient reports allergies.  Contractions: Not present. Vag. Bleeding: None.  Movement: Present. Denies leaking of fluid.   The following portions of the patient's history were reviewed and updated as appropriate: allergies, current medications, past family history, past medical history, past social history, past surgical history and problem list. Problem list updated.  Objective:   Vitals:   07/27/17 1338 07/27/17 1340  BP: (!) 152/87 128/87  Pulse: 88 100  Weight: 80.8 kg (178 lb 3.2 oz)     Fetal Status: Fetal Heart Rate (bpm): 156   Movement: Present     General:  Alert, oriented and cooperative. Patient is in no acute distress.  Skin: Skin is warm and dry. No rash noted.   Cardiovascular: Normal heart rate noted  Respiratory: Normal respiratory effort, no problems with respiration noted  Abdomen: Soft, gravid, appropriate for gestational age. Pain/Pressure: Absent     Pelvic:  Cervical exam deferred        Extremities: Normal range of motion.  Edema: None  Mental Status: Normal mood and affect. Normal behavior. Normal judgment and thought content.   Urinalysis:      Assessment and Plan:   Pregnancy: [id number] at [redacted]w[redacted]d  1. Supervision of high risk pregnancy in second trimester Stable - [place] MFM OB DETAIL +14 WK; Future  2. Chronic hypertension affecting pregnancy BP stable  Will start BASA today  - Comprehensive metabolic panel - Protein / creatinine ratio, urine - aspirin EC 81 MG tablet; Take 1 tablet (81 mg total) daily by mouth. Take after 12 weeks for prevention of preeclampsia later in pregnancy  Dispense: 300 tablet; Refill: 2  3. Advanced maternal age in multigravida, second trimester  - AFP, Serum, Open Spina Bifida  4. Drug abuse during pregnancy [facility]) Advised to d/c marijuana use Advised pt of counseling services here  5. Thyroid dysfunction, antepartum Will repeat thyroid labs at 28 weeks  6. History of trichomoniasis TOC collected today  7. Unwanted fertility Will need to sign BTL papers at 28 week visit  Preterm labor symptoms and general obstetric precautions including but not limited to vaginal bleeding, contractions, leaking of fluid and fetal movement were reviewed in detail with the patient. Please refer to After Visit Summary for other counseling recommendations.  Return in about 4 weeks (around 08/24/2017) for OB visit.   Chancy Milroy, MD

## 2017-07-27 NOTE — Patient Instructions (Signed)

## 2017-07-29 ENCOUNTER — Encounter (HOSPITAL_COMMUNITY): Payer: Self-pay | Admitting: Obstetrics and Gynecology

## 2017-07-29 LAB — PROTEIN / CREATININE RATIO, URINE
Creatinine, Urine: 135.4 mg/dL
PROTEIN UR: 12.9 mg/dL
PROTEIN/CREAT RATIO: 95 mg/g{creat} (ref 0–200)

## 2017-07-29 LAB — CERVICOVAGINAL ANCILLARY ONLY
BACTERIAL VAGINITIS: NEGATIVE
Candida vaginitis: NEGATIVE
Chlamydia: NEGATIVE
NEISSERIA GONORRHEA: NEGATIVE
Trichomonas: NEGATIVE

## 2017-07-29 LAB — AFP, SERUM, OPEN SPINA BIFIDA
AFP MOM: 0.81
AFP VALUE AFPOSL: 36 ng/mL
Gest. Age on Collection Date: 18.4 weeks
Maternal Age At EDD: 37 yr
OSBR RISK 1 IN: 10000
Test Results:: NEGATIVE
WEIGHT: 178 [lb_av]

## 2017-07-29 LAB — COMPREHENSIVE METABOLIC PANEL
A/G RATIO: 1.3 (ref 1.2–2.2)
ALT: 7 IU/L (ref 0–32)
AST: 9 IU/L (ref 0–40)
Albumin: 3.9 g/dL (ref 3.5–5.5)
Alkaline Phosphatase: 44 IU/L (ref 39–117)
BUN/Creatinine Ratio: 12 (ref 9–23)
BUN: 8 mg/dL (ref 6–20)
CHLORIDE: 103 mmol/L (ref 96–106)
CO2: 20 mmol/L (ref 20–29)
Calcium: 8.9 mg/dL (ref 8.7–10.2)
Creatinine, Ser: 0.65 mg/dL (ref 0.57–1.00)
GFR calc Af Amer: 132 mL/min/{1.73_m2} (ref >59)
GFR calc non Af Amer: 115 mL/min/{1.73_m2} (ref >59)
GLOBULIN, TOTAL: 3.1 g/dL (ref 1.5–4.5)
Glucose: 62 mg/dL — ABNORMAL LOW (ref 65–99)
POTASSIUM: 4.6 mmol/L (ref 3.5–5.2)
SODIUM: 140 mmol/L (ref 134–144)
Total Protein: 7 g/dL (ref 6.0–8.5)

## 2017-08-07 ENCOUNTER — Inpatient Hospital Stay (HOSPITAL_COMMUNITY)
Admission: RE | Admit: 2017-08-07 | Discharge: 2017-08-07 | Disposition: A | Discharge status: Home / Self Care | Payer: Self-pay | Source: Ambulatory Visit | Attending: Obstetrics and Gynecology | Admitting: Obstetrics and Gynecology

## 2017-08-24 ENCOUNTER — Encounter: Payer: Self-pay | Admitting: Obstetrics & Gynecology

## 2017-09-09 ENCOUNTER — Ambulatory Visit (INDEPENDENT_AMBULATORY_CARE_PROVIDER_SITE_OTHER): Payer: Medicaid Other | Admitting: Obstetrics and Gynecology

## 2017-09-09 VITALS — BP 139/90 | HR 77 | Wt 182.4 lb

## 2017-09-09 DIAGNOSIS — O0990 Supervision of high risk pregnancy, unspecified, unspecified trimester: Secondary | ICD-10-CM

## 2017-09-09 DIAGNOSIS — O99282 Endocrine, nutritional and metabolic diseases complicating pregnancy, second trimester: Secondary | ICD-10-CM

## 2017-09-09 DIAGNOSIS — O10919 Unspecified pre-existing hypertension complicating pregnancy, unspecified trimester: Secondary | ICD-10-CM

## 2017-09-09 DIAGNOSIS — O0992 Supervision of high risk pregnancy, unspecified, second trimester: Secondary | ICD-10-CM

## 2017-09-09 DIAGNOSIS — O99322 Drug use complicating pregnancy, second trimester: Secondary | ICD-10-CM

## 2017-09-09 DIAGNOSIS — O99320 Drug use complicating pregnancy, unspecified trimester: Secondary | ICD-10-CM

## 2017-09-09 DIAGNOSIS — O99280 Endocrine, nutritional and metabolic diseases complicating pregnancy, unspecified trimester: Principal | ICD-10-CM

## 2017-09-09 DIAGNOSIS — O10912 Unspecified pre-existing hypertension complicating pregnancy, second trimester: Secondary | ICD-10-CM

## 2017-09-09 DIAGNOSIS — O09522 Supervision of elderly multigravida, second trimester: Secondary | ICD-10-CM

## 2017-09-09 DIAGNOSIS — E079 Disorder of thyroid, unspecified: Secondary | ICD-10-CM

## 2017-09-09 DIAGNOSIS — Z3009 Encounter for other general counseling and advice on contraception: Secondary | ICD-10-CM

## 2017-09-09 DIAGNOSIS — F1910 Other psychoactive substance abuse, uncomplicated: Secondary | ICD-10-CM

## 2017-09-09 MED ORDER — ASPIRIN EC 81 MG PO TBEC: 81.0000 mg | DELAYED_RELEASE_TABLET | Freq: Every day | ORAL | 2 refills | Status: DC

## 2017-09-09 NOTE — Progress Notes (Signed)
   PRENATAL VISIT NOTE  Subjective:  Susan Walton is a 36 y.o. [id number] at [redacted]w[redacted]d being seen today for ongoing prenatal care.  She is currently monitored for the following issues for this high-risk pregnancy and has Adjustment disorder with depressed mood; Advanced maternal age in multigravida, second trimester; Pregnancy, supervision, high-risk; Chronic hypertension affecting pregnancy; Drug abuse during pregnancy ([place]); Thyroid dysfunction, antepartum; History of trichomoniasis; and Unwanted fertility on their problem list.  Patient reports no complaints.  Contractions: Regular. Vag. Bleeding: None.  Movement: Present. Denies leaking of fluid.   The following portions of the patient's history were reviewed and updated as appropriate: allergies, current medications, past family history, past medical history, past social history, past surgical history and problem list. Problem list updated.  Objective:   Vitals:   09/09/17 1345  BP: 139/90  Pulse: 77  Weight: 182 lb 6.4 oz (82.7 kg)    Fetal Status: Fetal Heart Rate (bpm): 148 Fundal Height: 24 cm Movement: Present     General:  Alert, oriented and cooperative. Patient is in no acute distress.  Skin: Skin is warm and dry. No rash noted.   Cardiovascular: Normal heart rate noted  Respiratory: Normal respiratory effort, no problems with respiration noted  Abdomen: Soft, gravid, appropriate for gestational age.  Pain/Pressure: Absent     Pelvic: Cervical exam deferred        Extremities: Normal range of motion.  Edema: None  Mental Status:  Normal mood and affect. Normal behavior. Normal judgment and thought content.   Assessment and Plan:  Pregnancy: [id number] at [redacted]w[redacted]d  1. Thyroid dysfunction, antepartum Subclinical hyperthyroid Repeat labs 28 weeks visit  2. Supervision of high risk pregnancy, antepartum Panorama, AFP negative Has not had anatomy, scheduled today  3. Drug abuse during pregnancy ([place]) UDS 06/16/17: +MJ, neg  etOH/cocaine   4. Advanced maternal age in multigravida, second trimester  5. Unwanted fertility Signed BTL papers 09/09/17  6. Chronic hypertension affecting pregnancy Not on meds, not taking baby ASA Reviewed importance of taking baby ASA, she will start today  7. H/o trichomonas Negative TOC  Preterm labor symptoms and general obstetric precautions including but not limited to vaginal bleeding, contractions, leaking of fluid and fetal movement were reviewed in detail with the patient. Please refer to After Visit Summary for other counseling recommendations.  Return in about 2 weeks (around 09/23/2017) for OB visit (MD), 2 hr GTT.   Sloan Leiter, MD

## 2017-09-15 NOTE — L&D Delivery Note (Addendum)
Delivery Note At 2:56 PM a viable female was delivered via  (Presentation: occiput anterior).  APGAR: 8, 9; weight pending. Placenta status: intact.  Cord:  with the following complications: brisk bleeding after delivery of placenta, fundus firm, LUS boggy, Cytotec ordered. Cord pH: N/A  Anesthesia:  Epidural Episiotomy:  None Lacerations:  Periurethral-hemastatic Suture Repair: n/a Est. Blood Loss (mL):  400   Mom to postpartum.  Baby to Nursery.  [place] 12/11/2017, 3:16 PM  Midwife attestation: I was gloved and present for delivery in its entirety and I agree with the above resident's note.  Julianne Handler, CNM 3:30 PM

## 2017-09-17 ENCOUNTER — Ambulatory Visit (HOSPITAL_COMMUNITY)
Admission: RE | Admit: 2017-09-17 | Discharge: 2017-09-17 | Disposition: A | Discharge status: Home / Self Care | Payer: Medicaid Other | Source: Ambulatory Visit | Attending: Obstetrics and Gynecology | Admitting: Obstetrics and Gynecology

## 2017-09-21 ENCOUNTER — Encounter: Payer: Self-pay | Admitting: *Deleted

## 2017-09-22 ENCOUNTER — Other Ambulatory Visit: Payer: Self-pay

## 2017-09-25 ENCOUNTER — Encounter: Payer: Self-pay | Admitting: Obstetrics and Gynecology

## 2017-10-08 ENCOUNTER — Encounter (HOSPITAL_COMMUNITY): Payer: Self-pay | Admitting: Emergency Medicine

## 2017-10-08 ENCOUNTER — Ambulatory Visit (HOSPITAL_COMMUNITY)
Admission: EM | Admit: 2017-10-08 | Discharge: 2017-10-08 | Disposition: A | Discharge status: Home / Self Care | Payer: Medicaid Other | Attending: Family Medicine | Admitting: Family Medicine

## 2017-10-08 DIAGNOSIS — R102 Pelvic and perineal pain: Secondary | ICD-10-CM | POA: Insufficient documentation

## 2017-10-08 DIAGNOSIS — O09522 Supervision of elderly multigravida, second trimester: Secondary | ICD-10-CM | POA: Diagnosis not present

## 2017-10-08 DIAGNOSIS — O26892 Other specified pregnancy related conditions, second trimester: Secondary | ICD-10-CM | POA: Insufficient documentation

## 2017-10-08 DIAGNOSIS — N898 Other specified noninflammatory disorders of vagina: Secondary | ICD-10-CM | POA: Insufficient documentation

## 2017-10-08 DIAGNOSIS — Z87891 Personal history of nicotine dependence: Secondary | ICD-10-CM | POA: Insufficient documentation

## 2017-10-08 DIAGNOSIS — Z7982 Long term (current) use of aspirin: Secondary | ICD-10-CM | POA: Diagnosis not present

## 2017-10-08 DIAGNOSIS — Z3A26 26 weeks gestation of pregnancy: Secondary | ICD-10-CM | POA: Diagnosis not present

## 2017-10-08 LAB — POCT URINALYSIS DIP (DEVICE)
GLUCOSE, UA: NEGATIVE mg/dL
Ketones, ur: 80 mg/dL — AB
Nitrite: NEGATIVE
PH: 6 (ref 5.0–8.0)
Protein, ur: 30 mg/dL — AB
Urobilinogen, UA: 0.2 mg/dL (ref 0.0–1.0)

## 2017-10-08 MED ORDER — CLOTRIMAZOLE 1 % VA CREA: 1.0000 | TOPICAL_CREAM | Freq: Every day | VAGINAL | 0 refills | Status: AC

## 2017-10-08 MED ORDER — METRONIDAZOLE 500 MG PO TABS: 500.0000 mg | ORAL_TABLET | Freq: Two times a day (BID) | ORAL | 0 refills | Status: AC

## 2017-10-08 NOTE — ED Triage Notes (Addendum)
PT reports vaginal discharge, irritation, and odor for 1 week. PT also reports generalized weakness for a few days. PT reports history of iron deficiency and missed her prenatal vitamin for 4-5 days. PT is [redacted] weeks pregnant.

## 2017-10-08 NOTE — ED Provider Notes (Signed)
[facility]    CSN: [id number] Arrival date & time: 10/08/17  [place]     History   Chief Complaint Chief Complaint  Patient presents with  . Exposure to STD    HPI Susan Walton is a 37 y.o. female [redacted] weeks pregnant presenting with vaginal discharge x 2 weeks. States discharge is green and has an  Odor. Also with itching and irritation. Also has felt UTI symptoms of burning, increased frequency with incomplete voiding. She had a sexual encounter with someone 2 weeks ago and believes he did not use a condom. States she missed her prenatal vitamins for about 4-5 days and has felt weak and lightheaded today. Prior to pregnancy she used OTC iron supplements. She at [employer] and symptoms resolved, feeling back to normal again. States she has not had good follow up with OBGYN but [facility]. Is endorsing increased abdominal/pelvic pain since discharge began. Denies nausea/vomiting. Denies fever.   HPI  Past Medical History:  Diagnosis Date  . Anemia   . Fibroid   . Hypertension   . Retinal detachment   . Thyroid disease    Hypothyroidism    Patient Active Problem List   Diagnosis Date Noted  . Pregnancy, supervision, high-risk 07/27/2017  . Chronic hypertension affecting pregnancy 07/27/2017  . 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 Thyroid dysfunction, antepartum 07/27/2017  . History of trichomoniasis 07/27/2017  . Unwanted fertility 07/27/2017  . Advanced maternal age in multigravida, second trimester   . Adjustment disorder with depressed mood 06/05/2017    Past Surgical History:  Procedure Laterality Date  . DILATION AND CURETTAGE OF UTERUS    . EYE SURGERY    . NO PAST SURGERIES      OB History    Gravida Para Term Preterm AB Living   8 4 4  0 3 4   SAB TAB Ectopic Multiple Live Births   2 1 0   4       Home Medications    Prior to Admission medications   Medication Sig Start Date End Date Taking? Authorizing Provider    acetaminophen (TYLENOL) 325 MG tablet Take 650 mg by mouth every 6 (six) hours as needed for moderate pain or headache.   Yes [provider]  IRON PO Take by mouth.   Yes [provider]  Prenatal Vit-Fe Fumarate-FA (NAT-RUL PRENATAL VITAMINS) 28-0.8 MG TABS Take 1 tablet by mouth daily.   Yes [provider]  aspirin EC 81 MG tablet Take 1 tablet (81 mg total) by mouth daily. Take after 12 weeks for prevention of preeclampsia later in pregnancy 09/09/17   Sloan Leiter, MD  clotrimazole (GYNE-LOTRIMIN) 1 % vaginal cream Place 1 Applicatorful vaginally at bedtime for 7 days. 10/08/17 10/15/17  Vyncent Overby C, PA-C  Loratadine-Pseudoephedrine (LORATADINE-D 24HR PO) Take 1 tablet by mouth daily.    [provider]  metroNIDAZOLE (FLAGYL) 500 MG tablet Take 1 tablet (500 mg total) by mouth 2 (two) times daily for 7 days. 10/08/17 10/15/17  Aneri Slagel C, PA-C  Promethazine HCl (PHENERGAN PO) Take by mouth.    [provider]    Family History No family history on file.  Social History Social History   Tobacco Use  . Smoking status: Former Smoker    Packs/day: 0.50    Types: Cigarettes  . Smokeless tobacco: Never Used  Substance Use Topics  . Alcohol use: Yes    Comment: quit mth after +UPT  .  Drug use: No    Comment: quit 1 mth after +UPT     Allergies   Patient has no known allergies.   Review of Systems Review of Systems  Constitutional: Negative for fever.  Respiratory: Negative for shortness of breath.   Cardiovascular: Negative for chest pain.  Gastrointestinal: Positive for abdominal pain. Negative for diarrhea, nausea and vomiting.  Genitourinary: Positive for dysuria, frequency and vaginal discharge. Negative for flank pain, genital sores, hematuria, menstrual problem, vaginal bleeding and vaginal pain.  Musculoskeletal: Negative for back pain.  Skin: Negative for rash.  Neurological: Positive for weakness and  light-headedness. Negative for dizziness and headaches.     Physical Exam Triage Vital Signs ED Triage Vitals  Enc Vitals Group     BP 10/08/17 1629 136/79     Pulse Rate 10/08/17 1629 80     Resp 10/08/17 1629 16     Temp 10/08/17 1629 98.9 F (37.2 C)     Temp Source 10/08/17 1629 Oral     SpO2 10/08/17 1629 99 %     Weight 10/08/17 1630 176 lb (79.8 kg)     Height 10/08/17 1630 5\' 7"  (1.702 m)     Head Circumference --      Peak Flow --      Pain Score 10/08/17 1633 4     Pain Loc --      Pain Edu? --      Excl. in [place]? --    No data found.  Updated Vital Signs BP 136/79 (BP Location: Right Arm)   Pulse 80   Temp 98.9 F (37.2 C) (Oral)   Resp 16   Ht 5\' 7"  (1.702 m)   Wt 176 lb (79.8 kg)   LMP 03/15/2017   SpO2 99%   BMI 27.57 kg/m    Physical Exam  Constitutional: She is oriented to person, place, and time. She appears well-developed and well-nourished. No distress.  HENT:  Head: Normocephalic and atraumatic.  Eyes: Conjunctivae are normal.  Neck: Neck supple.  Cardiovascular: Normal rate and regular rhythm.  No murmur heard. Pulmonary/Chest: Effort normal and breath sounds normal. No respiratory distress.  Abdominal: Soft. She exhibits no distension. There is no tenderness. There is no guarding.  Genitourinary:  Genitourinary Comments: Normal external genital exam- no evidence of rash or lesions On speculum exam- large amount of thick greenish white discharge, cervix erythematous. No CMT or adnexal tenderness on bimanual.   Musculoskeletal: She exhibits no edema.  Neurological: She is alert and oriented to person, place, and time. No cranial nerve deficit. Coordination normal.  Skin: Skin is warm and dry.  Psychiatric: She has a normal mood and affect.  Nursing note and vitals reviewed.    UC Treatments / Results  Labs (all labs ordered are listed, but only abnormal results are displayed) Labs Reviewed  POCT URINALYSIS DIP (DEVICE) - Abnormal;  Notable for the following components:      Result Value   Bilirubin Urine SMALL (*)    Ketones, ur 80 (*)    Hgb urine dipstick TRACE (*)    Protein, ur 30 (*)    Leukocytes, UA LARGE (*)    All other components within normal limits  URINE CULTURE  CERVICOVAGINAL ANCILLARY ONLY    EKG  EKG Interpretation None       Radiology No results found.  Procedures Procedures (including critical care time)  Medications Ordered in UC Medications - No data to display   Initial Impression / Assessment  and Plan / UC Course  I have reviewed the triage vital signs and the nursing notes.  Pertinent labs & imaging results that were available during my care of the patient were reviewed by me and considered in my medical decision making (see chart for details).     Patient with vaginal discharge. Screening for STD's performed, will call to inform of positive results. Will treat with metronidazole for BV/Trich given green discharge, history and this si safe in pregnancy. Will provide clotrimazole topical cream for yeast instead of oral. Will hold off on GC/Chlamydia treatment until results received.   Also with UTI symptoms, large leuks on UA will send for culture. Possibly from vaginitis. Given patient is pregnant would like to treat as well.   Patient lacking fever, nausea, vomiting, abdominal pain, CMT; unlikely PID. Discussed strict return precautions. Patient verbalized understanding and is agreeable with plan.  Weakness improved with eating, likely low glucose- advised to be diligent about vitamin, follow up with [facility]. Please return if symptoms reoccurring.      Final Clinical Impressions(s) / UC Diagnoses   Final diagnoses:  Vaginal discharge    ED Discharge Orders        Ordered    metroNIDAZOLE (FLAGYL) 500 MG tablet  2 times daily     10/08/17 1710    clotrimazole (GYNE-LOTRIMIN) 1 % vaginal cream  Daily at bedtime     10/08/17 1720       Controlled Substance  Prescriptions [place] Controlled Substance Registry consulted? Not Applicable   Janith Lima, PA-C 10/08/17 1729    Janith Lima, [place] 10/08/17 2159

## 2017-10-08 NOTE — Discharge Instructions (Addendum)
We will go ahead and start treatment for Trichomonas/bacterial vaginosis with metronidazole. Please refrain from sexual intercourse for 7 days while medicines eliminating infection. Please avoid alcohol while taking this. This is safe in pregnancy.   Please follow up with [facility] for further OB/GYN care.  We are testing you for Gonorrhea, Chlamydia, Trichomonas, Yeast and Bacterial Vaginosis. We will call you if anything is positive and let you know if you require any further treatment. Please inform partners of any positive results.   Please return if symptoms not improving with treatment, development of fever, nausea, vomiting, abdominal pain.

## 2017-10-09 ENCOUNTER — Telehealth: Payer: Self-pay | Admitting: Family Medicine

## 2017-10-09 LAB — CERVICOVAGINAL ANCILLARY ONLY
Bacterial vaginitis: NEGATIVE
CHLAMYDIA, DNA PROBE: NEGATIVE
Candida vaginitis: POSITIVE — AB
Neisseria Gonorrhea: NEGATIVE
Trichomonas: NEGATIVE

## 2017-10-09 NOTE — Telephone Encounter (Signed)
Patient is suppose to get 2 hr gtt. She stated she didn't feel like she would need this test. She has requested a call to ask questions about this test.She may need to come in before her appointment to take this test. Please call her on [phone].

## 2017-10-09 NOTE — Telephone Encounter (Signed)
Called pt and explained the importance of 2hr GTT to be performed. Pt's questions were answered and instructions given. Pt voiced understanding and agreed to appt on 1/29 @ 0850.

## 2017-10-11 LAB — URINE CULTURE

## 2017-10-13 ENCOUNTER — Other Ambulatory Visit: Payer: Self-pay

## 2017-10-13 ENCOUNTER — Other Ambulatory Visit: Payer: Medicaid Other

## 2017-10-13 DIAGNOSIS — O0990 Supervision of high risk pregnancy, unspecified, unspecified trimester: Secondary | ICD-10-CM

## 2017-10-13 NOTE — Progress Notes (Signed)
2

## 2017-10-14 LAB — GLUCOSE TOLERANCE, 2 HOURS W/ 1HR
GLUCOSE, FASTING: 79 mg/dL (ref 65–91)
Glucose, 1 hour: 101 mg/dL (ref 65–179)
Glucose, 2 hour: 83 mg/dL (ref 65–152)

## 2017-10-16 ENCOUNTER — Encounter: Payer: Self-pay | Admitting: Obstetrics and Gynecology

## 2017-10-20 ENCOUNTER — Ambulatory Visit (INDEPENDENT_AMBULATORY_CARE_PROVIDER_SITE_OTHER): Payer: Medicaid Other | Admitting: Family Medicine

## 2017-10-20 VITALS — BP 148/87 | HR 78 | Wt 192.0 lb

## 2017-10-20 DIAGNOSIS — O09522 Supervision of elderly multigravida, second trimester: Secondary | ICD-10-CM

## 2017-10-20 DIAGNOSIS — O10919 Unspecified pre-existing hypertension complicating pregnancy, unspecified trimester: Secondary | ICD-10-CM

## 2017-10-20 DIAGNOSIS — O0993 Supervision of high risk pregnancy, unspecified, third trimester: Secondary | ICD-10-CM

## 2017-10-20 DIAGNOSIS — F329 Major depressive disorder, single episode, unspecified: Secondary | ICD-10-CM | POA: Insufficient documentation

## 2017-10-20 DIAGNOSIS — O99343 Other mental disorders complicating pregnancy, third trimester: Secondary | ICD-10-CM

## 2017-10-20 DIAGNOSIS — O99280 Endocrine, nutritional and metabolic diseases complicating pregnancy, unspecified trimester: Secondary | ICD-10-CM

## 2017-10-20 DIAGNOSIS — E079 Disorder of thyroid, unspecified: Secondary | ICD-10-CM

## 2017-10-20 MED ORDER — ASPIRIN 81 MG PO TABS: 81.0000 mg | ORAL_TABLET | Freq: Every day | ORAL | 1 refills | Status: DC

## 2017-10-20 MED ORDER — SERTRALINE HCL 25 MG PO TABS: 25.0000 mg | ORAL_TABLET | Freq: Every day | ORAL | 0 refills | Status: DC

## 2017-10-20 NOTE — Progress Notes (Signed)
   PRENATAL VISIT NOTE  Subjective:  Susan Walton is a 37 y.o. [id number] at [redacted]w[redacted]d being seen today for ongoing prenatal care.  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  Patient reports no complaints.  Contractions: Not present. Vag. Bleeding: None.  Movement: Present. Denies leaking of fluid.   The following portions of the patient's history were reviewed and updated as appropriate: allergies, current medications, past family history, past medical history, past social history, past surgical history and problem list. Problem list updated.  Objective:   Vitals:   10/20/17 1555 10/20/17 1558  BP: (!) 154/88 (!) 148/87  Pulse: 78   Weight: 192 lb (87.1 kg)     Fetal Status: Fetal Heart Rate (bpm): 154 Fundal Height: 31 cm Movement: Present     General:  Alert, oriented and cooperative. Patient is in no acute distress.  Skin: Skin is warm and dry. No rash noted.   Cardiovascular: Normal heart rate noted  Respiratory: Normal respiratory effort, no problems with respiration noted  Abdomen: Soft, gravid, appropriate for gestational age.  Pain/Pressure: Present     Pelvic: Cervical exam deferred        Extremities: Normal range of motion.  Edema: None  Mental Status:  Normal mood and affect. Normal behavior. Normal judgment and thought content.   Assessment and Plan:  Pregnancy: [id number] at [redacted]w[redacted]d  1. Chronic hypertension affecting pregnancy Does not need meds at this time. Refilled aspirin 81 mg today.   2. Thyroid dysfunction, antepartum  3. Supervision of high risk pregnancy in third trimester  4. Advanced maternal age in  multigravida, second trimester  5. Depression in pregnancy- will start zoloft. Follow up in 2 weeks.   Preterm labor symptoms and general obstetric precautions including but not limited to vaginal bleeding, contractions, leaking of fluid and fetal movement were reviewed in detail with the patient. Please refer to After Visit Summary for other counseling recommendations.  Return in about 2 weeks (around 11/03/2017).   Dannielle Huh, DO

## 2017-10-20 NOTE — Progress Notes (Signed)
Patient reports blurry vision that started 3 days ago. Denies headaches or dizziness. Patient declines seeing [facility] today for elevated gad7, Dr Manus Rudd aware.

## 2017-10-28 ENCOUNTER — Encounter (HOSPITAL_COMMUNITY): Payer: Self-pay

## 2017-10-28 ENCOUNTER — Other Ambulatory Visit: Payer: Self-pay | Admitting: Obstetrics and Gynecology

## 2017-10-28 ENCOUNTER — Ambulatory Visit (HOSPITAL_COMMUNITY)
Admission: RE | Admit: 2017-10-28 | Discharge: 2017-10-28 | Disposition: A | Discharge status: Home / Self Care | Payer: Medicaid Other | Source: Ambulatory Visit | Attending: Obstetrics and Gynecology | Admitting: Obstetrics and Gynecology

## 2017-10-28 DIAGNOSIS — O10919 Unspecified pre-existing hypertension complicating pregnancy, unspecified trimester: Secondary | ICD-10-CM

## 2017-10-28 DIAGNOSIS — O99323 Drug use complicating pregnancy, third trimester: Secondary | ICD-10-CM | POA: Diagnosis not present

## 2017-10-28 DIAGNOSIS — Z363 Encounter for antenatal screening for malformations: Secondary | ICD-10-CM

## 2017-10-28 DIAGNOSIS — O99283 Endocrine, nutritional and metabolic diseases complicating pregnancy, third trimester: Secondary | ICD-10-CM | POA: Insufficient documentation

## 2017-10-28 DIAGNOSIS — O09522 Supervision of elderly multigravida, second trimester: Secondary | ICD-10-CM

## 2017-10-28 DIAGNOSIS — F1910 Other psychoactive substance abuse, uncomplicated: Secondary | ICD-10-CM | POA: Insufficient documentation

## 2017-10-28 DIAGNOSIS — O0933 Supervision of pregnancy with insufficient antenatal care, third trimester: Secondary | ICD-10-CM | POA: Diagnosis not present

## 2017-10-28 DIAGNOSIS — O10019 Pre-existing essential hypertension complicating pregnancy, unspecified trimester: Secondary | ICD-10-CM | POA: Insufficient documentation

## 2017-10-28 DIAGNOSIS — Z3A31 31 weeks gestation of pregnancy: Secondary | ICD-10-CM | POA: Diagnosis not present

## 2017-10-28 DIAGNOSIS — E0590 Thyrotoxicosis, unspecified without thyrotoxic crisis or storm: Secondary | ICD-10-CM

## 2017-10-28 DIAGNOSIS — O09523 Supervision of elderly multigravida, third trimester: Secondary | ICD-10-CM | POA: Insufficient documentation

## 2017-10-28 NOTE — Addendum Note (Signed)
Encounter addended by: Elisabeth Cara, RT on: 10/28/2017 3:34 PM  Actions taken: Imaging Exam ended

## 2017-10-29 ENCOUNTER — Other Ambulatory Visit (HOSPITAL_COMMUNITY): Payer: Self-pay | Admitting: *Deleted

## 2017-10-29 DIAGNOSIS — O10919 Unspecified pre-existing hypertension complicating pregnancy, unspecified trimester: Secondary | ICD-10-CM

## 2017-11-03 ENCOUNTER — Ambulatory Visit (INDEPENDENT_AMBULATORY_CARE_PROVIDER_SITE_OTHER): Payer: Medicaid Other | Admitting: Obstetrics and Gynecology

## 2017-11-03 VITALS — BP 135/85 | HR 85 | Wt 188.5 lb

## 2017-11-03 DIAGNOSIS — O99280 Endocrine, nutritional and metabolic diseases complicating pregnancy, unspecified trimester: Secondary | ICD-10-CM

## 2017-11-03 DIAGNOSIS — F329 Major depressive disorder, single episode, unspecified: Secondary | ICD-10-CM

## 2017-11-03 DIAGNOSIS — O99343 Other mental disorders complicating pregnancy, third trimester: Secondary | ICD-10-CM

## 2017-11-03 DIAGNOSIS — O0993 Supervision of high risk pregnancy, unspecified, third trimester: Secondary | ICD-10-CM

## 2017-11-03 DIAGNOSIS — O09522 Supervision of elderly multigravida, second trimester: Secondary | ICD-10-CM

## 2017-11-03 DIAGNOSIS — E079 Disorder of thyroid, unspecified: Secondary | ICD-10-CM

## 2017-11-03 DIAGNOSIS — Z3009 Encounter for other general counseling and advice on contraception: Secondary | ICD-10-CM

## 2017-11-03 DIAGNOSIS — O10919 Unspecified pre-existing hypertension complicating pregnancy, unspecified trimester: Secondary | ICD-10-CM

## 2017-11-03 NOTE — Patient Instructions (Signed)

## 2017-11-03 NOTE — Progress Notes (Signed)
Subjective:  Susan Walton is a 37 y.o. [id number] at [redacted]w[redacted]d being seen today for ongoing prenatal care.  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  Patient reports no complaints.  Contractions: Irritability. Vag. Bleeding: None.  Movement: Present. Denies leaking of fluid.   The following portions of the patient's history were reviewed and updated as appropriate: allergies, current medications, past family history, past medical history, past social history, past surgical history and problem list. Problem list updated.  Objective:   Vitals:   11/03/17 1638  BP: 135/85  Pulse: 85  Weight: 188 lb 8 oz (85.5 kg)    Fetal Status: Fetal Heart Rate (bpm): 154   Movement: Present     General:  Alert, oriented and cooperative. Patient is in no acute distress.  Skin: Skin is warm and dry. No rash noted.   Cardiovascular: Normal heart rate noted  Respiratory: Normal respiratory effort, no problems with respiration noted  Abdomen: Soft, gravid, appropriate for gestational age. Pain/Pressure: Absent     Pelvic: Vag. Bleeding: None     Cervical exam deferred        Extremities: Normal range of motion.  Edema: None  Mental Status: Normal mood and affect. Normal behavior. Normal judgment and thought content.   Urinalysis:      Assessment and Plan:  Pregnancy: [id number] at [redacted]w[redacted]d  1. Supervision of high risk pregnancy in third trimester Doing well. Continue routine care.   2. Chronic hypertension affecting pregnancy BP wnl. Not on meds. Not taking ASA. Stressed the importance of ASA.   Need to start antepartum testing; orders in for weekly BPPs starting tomorrow.   3. Thyroid dysfunction, antepartum Will recheck thyroid labs. Not on meds. Asymptomatic. Subclinical hyperthyroidism.   - TSH - T4  4. Advanced maternal age in multigravida, second trimester Low risk panorama  5. Unwanted fertility Desires BTl. Consent signed 09/09/17.   6. Depression affecting pregnancy in third trimester, antepartum Continue Zoloft   Preterm labor symptoms and general obstetric precautions including but not limited to vaginal bleeding, contractions, leaking of fluid and fetal movement were reviewed in detail with the patient. Please refer to After Visit Summary for other counseling recommendations.  Return in about 2 weeks (around 11/17/2017) for HROB.   Katheren Shams, DO

## 2017-11-04 ENCOUNTER — Ambulatory Visit (HOSPITAL_COMMUNITY)
Admission: RE | Admit: 2017-11-04 | Discharge: 2017-11-04 | Disposition: A | Discharge status: Home / Self Care | Payer: Medicaid Other | Source: Ambulatory Visit | Attending: Obstetrics and Gynecology | Admitting: Obstetrics and Gynecology

## 2017-11-04 ENCOUNTER — Encounter: Payer: Self-pay | Admitting: Obstetrics and Gynecology

## 2017-11-04 ENCOUNTER — Encounter (HOSPITAL_COMMUNITY): Payer: Self-pay

## 2017-11-04 DIAGNOSIS — O99283 Endocrine, nutritional and metabolic diseases complicating pregnancy, third trimester: Secondary | ICD-10-CM | POA: Diagnosis not present

## 2017-11-04 DIAGNOSIS — O10919 Unspecified pre-existing hypertension complicating pregnancy, unspecified trimester: Secondary | ICD-10-CM

## 2017-11-04 DIAGNOSIS — O99323 Drug use complicating pregnancy, third trimester: Secondary | ICD-10-CM | POA: Diagnosis not present

## 2017-11-04 DIAGNOSIS — O09523 Supervision of elderly multigravida, third trimester: Secondary | ICD-10-CM | POA: Diagnosis not present

## 2017-11-04 DIAGNOSIS — F1910 Other psychoactive substance abuse, uncomplicated: Secondary | ICD-10-CM | POA: Insufficient documentation

## 2017-11-04 DIAGNOSIS — O0933 Supervision of pregnancy with insufficient antenatal care, third trimester: Secondary | ICD-10-CM | POA: Diagnosis not present

## 2017-11-04 DIAGNOSIS — Z3A32 32 weeks gestation of pregnancy: Secondary | ICD-10-CM | POA: Insufficient documentation

## 2017-11-04 DIAGNOSIS — E0590 Thyrotoxicosis, unspecified without thyrotoxic crisis or storm: Secondary | ICD-10-CM | POA: Insufficient documentation

## 2017-11-04 DIAGNOSIS — O10013 Pre-existing essential hypertension complicating pregnancy, third trimester: Secondary | ICD-10-CM | POA: Diagnosis not present

## 2017-11-04 LAB — T4: T4, Total: 10.5 ug/dL (ref 4.5–12.0)

## 2017-11-04 LAB — TSH: TSH: 0.342 u[IU]/mL — ABNORMAL LOW (ref 0.450–4.500)

## 2017-11-04 NOTE — Addendum Note (Signed)
Encounter addended by: Jeanene Erb on: 11/04/2017 2:32 PM  Actions taken: Imaging Exam ended

## 2017-11-10 ENCOUNTER — Encounter (HOSPITAL_COMMUNITY): Payer: Self-pay | Admitting: Emergency Medicine

## 2017-11-10 ENCOUNTER — Ambulatory Visit (HOSPITAL_COMMUNITY)
Admission: EM | Admit: 2017-11-10 | Discharge: 2017-11-10 | Disposition: A | Discharge status: Home / Self Care | Payer: Medicaid Other | Attending: Family Medicine | Admitting: Family Medicine

## 2017-11-10 DIAGNOSIS — O99513 Diseases of the respiratory system complicating pregnancy, third trimester: Secondary | ICD-10-CM | POA: Insufficient documentation

## 2017-11-10 DIAGNOSIS — O26893 Other specified pregnancy related conditions, third trimester: Secondary | ICD-10-CM | POA: Diagnosis present

## 2017-11-10 DIAGNOSIS — Z3A33 33 weeks gestation of pregnancy: Secondary | ICD-10-CM | POA: Insufficient documentation

## 2017-11-10 DIAGNOSIS — O163 Unspecified maternal hypertension, third trimester: Secondary | ICD-10-CM | POA: Insufficient documentation

## 2017-11-10 DIAGNOSIS — R51 Headache: Secondary | ICD-10-CM | POA: Diagnosis present

## 2017-11-10 DIAGNOSIS — J019 Acute sinusitis, unspecified: Secondary | ICD-10-CM | POA: Diagnosis not present

## 2017-11-10 DIAGNOSIS — Z87891 Personal history of nicotine dependence: Secondary | ICD-10-CM | POA: Diagnosis not present

## 2017-11-10 DIAGNOSIS — R300 Dysuria: Secondary | ICD-10-CM

## 2017-11-10 LAB — POCT URINALYSIS DIP (DEVICE)
Bilirubin Urine: NEGATIVE
Glucose, UA: NEGATIVE mg/dL
Hgb urine dipstick: NEGATIVE
KETONES UR: 40 mg/dL — AB
Nitrite: NEGATIVE
PH: 7 (ref 5.0–8.0)
PROTEIN: NEGATIVE mg/dL
Specific Gravity, Urine: 1.02 (ref 1.005–1.030)
Urobilinogen, UA: 0.2 mg/dL (ref 0.0–1.0)

## 2017-11-10 MED ORDER — AMOXICILLIN-POT CLAVULANATE 875-125 MG PO TABS: 1.0000 | ORAL_TABLET | Freq: Two times a day (BID) | ORAL | 0 refills | Status: AC

## 2017-11-10 NOTE — ED Triage Notes (Signed)
PT reports UTI symptoms for 3-4 days, headache for 3-4 days, and URI for 1 month.

## 2017-11-10 NOTE — ED Provider Notes (Signed)
[facility]    CSN: [id number] Arrival date & time: 11/10/17  1539     History   Chief Complaint Chief Complaint  Patient presents with  . Urinary Tract Infection  . URI    HPI Susan Walton is a 37 y.o. female approximately [redacted] weeks pregnant, high risk pregnancy presenting today with concerns of UTI, headache, URI, elevated blood pressure.[facility] follow up and was advised to begin weekly BPP. Patient's next appointment is tomorrow. At time of this visit she was not hypertensive. But encouraged to begin baby aspirin daily. She has been doing this but feels it has given her a headache and caused darker stools. She denies changes in vision, weakness, speech slurring. Denies lightheadedness and dizziness. She does endorse occasional nausea.   She is concerned because she has had dysuria and vaginal itching for 3-4 days. She is unsure if it is UTI or Trichomonas. She states she was drinking a lot of pepsi and feels she may have gotten a UTI from this. She denies vaginal discharge.   She is also here because she has had nasal congestion and sinus symptoms for about 3 weeks. Denies fever, Sore throat, cough. Denies SOB or CP. Has not taken anything for her symptoms.   HPI  Past Medical History:  Diagnosis Date  . Anemia   . Fibroid   . Hypertension   . Retinal detachment   . Thyroid disease    Hypothyroidism    Patient Active Problem List   Diagnosis Date Noted  . 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 Pregnancy, supervision, high-risk 07/27/2017  . Chronic hypertension affecting pregnancy 07/27/2017  . 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 Thyroid dysfunction, antepartum 07/27/2017  . History of trichomoniasis 07/27/2017  . Unwanted fertility 07/27/2017  . Advanced maternal age in multigravida, second trimester   . Adjustment disorder with depressed mood 06/05/2017    Past  Surgical History:  Procedure Laterality Date  . DILATION AND CURETTAGE OF UTERUS    . EYE SURGERY    . NO PAST SURGERIES      OB History    Gravida Para Term Preterm AB Living   8 4 4  0 3 4   SAB TAB Ectopic Multiple Live Births   2 1 0   4       Home Medications    Prior to Admission medications   Medication Sig Start Date End Date Taking? Authorizing Provider  aspirin 81 MG tablet Take 1 tablet (81 mg total) by mouth daily. 10/20/17  Yes Moss, Amber, DO  Prenatal Vit-Fe Fumarate-FA (NAT-RUL PRENATAL VITAMINS) 28-0.8 MG TABS Take 1 tablet by mouth daily.   Yes [provider]  acetaminophen (TYLENOL) 325 MG tablet Take 650 mg by mouth every 6 (six) hours as needed for moderate pain or headache.    [provider]  amoxicillin-clavulanate (AUGMENTIN) 875-125 MG tablet Take 1 tablet by mouth every 12 (twelve) hours for 10 days. 11/10/17 11/20/17  Merril Nagy C, PA-C  IRON PO Take by mouth.    [provider]  Loratadine-Pseudoephedrine (LORATADINE-D 24HR PO) Take 1 tablet by mouth daily.    [provider]  sertraline (ZOLOFT) 25 MG tablet Take 1 tablet (25 mg total) by mouth daily. 10/20/17   Dannielle Huh, DO    Family History No family history on file.  Social History Social History   Tobacco Use  . Smoking status:  Former Smoker    Packs/day: 0.50    Types: Cigarettes  . Smokeless tobacco: Never Used  Substance Use Topics  . Alcohol use: Yes    Comment: quit mth after +UPT  . Drug use: No    Comment: quit 1 mth after +UPT     Allergies   Patient has no known allergies.   Review of Systems Review of Systems  Constitutional: Negative for chills, fatigue and fever.  HENT: Positive for congestion, rhinorrhea and sinus pressure. Negative for ear pain, sore throat and trouble swallowing.   Respiratory: Negative for cough, chest tightness and shortness of breath.   Cardiovascular: Negative for chest pain.  Gastrointestinal: Positive  for nausea. Negative for abdominal pain, diarrhea and vomiting.  Genitourinary: Positive for dysuria and frequency. Negative for flank pain, genital sores, hematuria, menstrual problem, vaginal bleeding, vaginal discharge and vaginal pain.  Musculoskeletal: Negative for back pain and myalgias.  Skin: Negative for rash.  Neurological: Positive for headaches. Negative for dizziness, weakness and light-headedness.     Physical Exam Triage Vital Signs ED Triage Vitals  Enc Vitals Group     BP 11/10/17 1636 (!) 171/99     Pulse Rate 11/10/17 1636 82     Resp 11/10/17 1636 16     Temp 11/10/17 1636 98.7 F (37.1 C)     Temp Source 11/10/17 1636 Oral     SpO2 11/10/17 1636 100 %     Weight 11/10/17 1637 188 lb (85.3 kg)     Height --      Head Circumference --      Peak Flow --      Pain Score 11/10/17 1636 6     Pain Loc --      Pain Edu? --      Excl. in [place]? --    No data found.  Updated Vital Signs BP (!) 171/99   Pulse 82   Temp 98.7 F (37.1 C) (Oral)   Resp 16   Wt 188 lb (85.3 kg)   LMP 03/15/2017   SpO2 100%   BMI 29.44 kg/m   Visual Acuity Right Eye Distance:   Left Eye Distance:   Bilateral Distance:    Right Eye Near:   Left Eye Near:    Bilateral Near:     Physical Exam  Constitutional: She appears well-developed and well-nourished. No distress.  HENT:  Head: Normocephalic and atraumatic.  Bilateral TM's non erythematous, nasal mucosa erythematous, rhinnorhea present, posterior oropharynx with erythema  Eyes: Conjunctivae are normal.  Neck: Neck supple.  Cardiovascular: Normal rate and regular rhythm.  No murmur heard. Pulmonary/Chest: Effort normal and breath sounds normal. No respiratory distress.  CTABL  Abdominal: There is no tenderness.  Firm from pregnancy  Musculoskeletal: She exhibits no edema.  Neurological: She is alert.  Skin: Skin is warm and dry.  Psychiatric: She has a normal mood and affect.  Nursing note and vitals  reviewed.    UC Treatments / Results  Labs (all labs ordered are listed, but only abnormal results are displayed) Labs Reviewed  POCT URINALYSIS DIP (DEVICE) - Abnormal; Notable for the following components:      Result Value   Ketones, ur 40 (*)    Leukocytes, UA SMALL (*)    All other components within normal limits  URINE CULTURE  CERVICOVAGINAL ANCILLARY ONLY    EKG  EKG Interpretation None       Radiology No results found.  Procedures Procedures (including critical care time)  Medications Ordered in UC Medications - No data to display   Initial Impression / Assessment and Plan / UC Course  I have reviewed the triage vital signs and the nursing notes.  Pertinent labs & imaging results that were available during my care of the patient were reviewed by me and considered in my medical decision making (see chart for details).    Blood pressure significantly elevated in clinic today. Will have call OBGYN in AM and follow up tomorrow with her set appointment to discuss need for initiation of HTN medications. Patient verbalized understanding of this. Patient without protein in urine and does not have peripheral swelling.  Patient with small leuks on UA, will treat with Augmentin given also sinusitis symptoms/length of time. Vaginal swab obtained to check for STD/BV/yeast.   Discussed strict return precautions. Patient verbalized understanding and is agreeable with plan.   Final Clinical Impressions(s) / UC Diagnoses   Final diagnoses:  Dysuria  Acute sinusitis with symptoms > 10 days  Hypertension during pregnancy in third trimester, unspecified hypertension in pregnancy type    ED Discharge Orders        Ordered    amoxicillin-clavulanate (AUGMENTIN) 875-125 MG tablet  Every 12 hours     11/10/17 1725       Controlled Substance Prescriptions [place] Controlled Substance Registry consulted? Not Applicable   Janith Lima, [place] 11/10/17 2310

## 2017-11-10 NOTE — Discharge Instructions (Signed)
BP today in clinic 171/99, 178/98 Please follow up with [facility] tomorrow  Please begin Augmentin twice daily to help with sinuses and UTI.

## 2017-11-10 NOTE — ED Triage Notes (Signed)
PT pregnant, hypertensive in triage. Has seen OB for same

## 2017-11-11 ENCOUNTER — Ambulatory Visit (HOSPITAL_COMMUNITY)
Admission: RE | Admit: 2017-11-11 | Discharge: 2017-11-11 | Disposition: A | Discharge status: Home / Self Care | Payer: Medicaid Other | Source: Ambulatory Visit | Attending: Obstetrics and Gynecology | Admitting: Obstetrics and Gynecology

## 2017-11-11 ENCOUNTER — Encounter (HOSPITAL_COMMUNITY): Payer: Self-pay

## 2017-11-11 ENCOUNTER — Other Ambulatory Visit (HOSPITAL_COMMUNITY): Payer: Self-pay | Admitting: Obstetrics and Gynecology

## 2017-11-11 DIAGNOSIS — O99323 Drug use complicating pregnancy, third trimester: Secondary | ICD-10-CM | POA: Diagnosis not present

## 2017-11-11 DIAGNOSIS — E0590 Thyrotoxicosis, unspecified without thyrotoxic crisis or storm: Secondary | ICD-10-CM | POA: Insufficient documentation

## 2017-11-11 DIAGNOSIS — O0933 Supervision of pregnancy with insufficient antenatal care, third trimester: Secondary | ICD-10-CM | POA: Diagnosis not present

## 2017-11-11 DIAGNOSIS — O10919 Unspecified pre-existing hypertension complicating pregnancy, unspecified trimester: Secondary | ICD-10-CM

## 2017-11-11 DIAGNOSIS — O09523 Supervision of elderly multigravida, third trimester: Secondary | ICD-10-CM

## 2017-11-11 DIAGNOSIS — F1910 Other psychoactive substance abuse, uncomplicated: Secondary | ICD-10-CM | POA: Insufficient documentation

## 2017-11-11 DIAGNOSIS — O99283 Endocrine, nutritional and metabolic diseases complicating pregnancy, third trimester: Secondary | ICD-10-CM | POA: Insufficient documentation

## 2017-11-11 DIAGNOSIS — O10013 Pre-existing essential hypertension complicating pregnancy, third trimester: Secondary | ICD-10-CM | POA: Insufficient documentation

## 2017-11-11 DIAGNOSIS — Z3A33 33 weeks gestation of pregnancy: Secondary | ICD-10-CM

## 2017-11-11 LAB — CERVICOVAGINAL ANCILLARY ONLY
BACTERIAL VAGINITIS: POSITIVE — AB
Candida vaginitis: POSITIVE — AB
Chlamydia: NEGATIVE
Neisseria Gonorrhea: NEGATIVE
Trichomonas: NEGATIVE

## 2017-11-11 LAB — URINE CULTURE

## 2017-11-12 ENCOUNTER — Telehealth (HOSPITAL_COMMUNITY): Payer: Self-pay | Admitting: Internal Medicine

## 2017-11-12 MED ORDER — FLUCONAZOLE 150 MG PO TABS: 150.0000 mg | ORAL_TABLET | Freq: Once | ORAL | 0 refills | Status: DC

## 2017-11-12 MED ORDER — METRONIDAZOLE 500 MG PO TABS: 500.0000 mg | ORAL_TABLET | Freq: Two times a day (BID) | ORAL | 0 refills | Status: DC

## 2017-11-12 MED ORDER — TERCONAZOLE 0.4 % VA CREA: 1.0000 | TOPICAL_CREAM | Freq: Every day | VAGINAL | 0 refills | Status: AC

## 2017-11-12 NOTE — Addendum Note (Signed)
Addended by: Sherlene Shams on: 11/12/2017 11:48 AM   Modules accepted: Orders

## 2017-11-12 NOTE — Telephone Encounter (Signed)
Clinical staff, please let patient know that test for candida (yeast) was positive.  Prescription for terconazole vaginal cream was sent to the pharmacy of record, [place] at [facility].  Test for gardnerella (bacterial vaginosis) was also positive.  Rx for metronidazole was sent to the pharmacy.    Recheck or followup with OB care provider for further evaluation if symptoms are not improving.  LM

## 2017-11-17 ENCOUNTER — Encounter: Payer: Self-pay | Admitting: Obstetrics and Gynecology

## 2017-11-18 ENCOUNTER — Ambulatory Visit (HOSPITAL_COMMUNITY)
Admission: RE | Admit: 2017-11-18 | Discharge: 2017-11-18 | Disposition: A | Discharge status: Home / Self Care | Payer: Medicaid Other | Source: Ambulatory Visit | Attending: Obstetrics and Gynecology | Admitting: Obstetrics and Gynecology

## 2017-11-24 ENCOUNTER — Ambulatory Visit (INDEPENDENT_AMBULATORY_CARE_PROVIDER_SITE_OTHER): Payer: Medicaid Other | Admitting: Obstetrics and Gynecology

## 2017-11-24 ENCOUNTER — Other Ambulatory Visit (HOSPITAL_COMMUNITY)
Admission: RE | Admit: 2017-11-24 | Discharge: 2017-11-24 | Disposition: A | Discharge status: Home / Self Care | Payer: Medicaid Other | Source: Ambulatory Visit | Attending: Family Medicine | Admitting: Family Medicine

## 2017-11-24 VITALS — BP 145/94 | HR 102 | Wt 196.1 lb

## 2017-11-24 DIAGNOSIS — O99343 Other mental disorders complicating pregnancy, third trimester: Secondary | ICD-10-CM

## 2017-11-24 DIAGNOSIS — O99280 Endocrine, nutritional and metabolic diseases complicating pregnancy, unspecified trimester: Secondary | ICD-10-CM

## 2017-11-24 DIAGNOSIS — O09522 Supervision of elderly multigravida, second trimester: Secondary | ICD-10-CM

## 2017-11-24 DIAGNOSIS — O0993 Supervision of high risk pregnancy, unspecified, third trimester: Secondary | ICD-10-CM | POA: Diagnosis present

## 2017-11-24 DIAGNOSIS — F32A Depression, unspecified: Secondary | ICD-10-CM

## 2017-11-24 DIAGNOSIS — F1910 Other psychoactive substance abuse, uncomplicated: Secondary | ICD-10-CM

## 2017-11-24 DIAGNOSIS — Z3009 Encounter for other general counseling and advice on contraception: Secondary | ICD-10-CM

## 2017-11-24 DIAGNOSIS — E079 Disorder of thyroid, unspecified: Secondary | ICD-10-CM

## 2017-11-24 DIAGNOSIS — F329 Major depressive disorder, single episode, unspecified: Secondary | ICD-10-CM

## 2017-11-24 DIAGNOSIS — O10919 Unspecified pre-existing hypertension complicating pregnancy, unspecified trimester: Secondary | ICD-10-CM

## 2017-11-24 DIAGNOSIS — O99320 Drug use complicating pregnancy, unspecified trimester: Secondary | ICD-10-CM

## 2017-11-24 LAB — OB RESULTS CONSOLE GBS: STREP GROUP B AG: POSITIVE

## 2017-11-24 MED ORDER — LABETALOL HCL 200 MG PO TABS: 200.0000 mg | ORAL_TABLET | Freq: Two times a day (BID) | ORAL | 1 refills | Status: DC

## 2017-11-24 NOTE — Progress Notes (Signed)
Subjective:  Susan Walton is a 36 y.o. [id number] at [redacted]w[redacted]d being seen today for ongoing prenatal care.  She is currently monitored for the following issues for this high-risk pregnancy and has Adjustment disorder with depressed mood; Advanced maternal age in multigravida, second trimester; Pregnancy, supervision, high-risk; Chronic hypertension affecting pregnancy; Drug abuse during pregnancy ([place]); Thyroid dysfunction, antepartum; History of trichomoniasis; Unwanted fertility; and Depression affecting pregnancy in third trimester, antepartum on their problem list.  Patient reports no complaints. States she has been going through a lot of stress. Contractions: Irritability. Vag. Bleeding: None.  Movement: Present. Denies leaking of fluid.   The following portions of the patient's history were reviewed and updated as appropriate: allergies, current medications, past family history, past medical history, past social history, past surgical history and problem list. Problem list updated.  Objective:   Vitals:   11/24/17 1353 11/24/17 1359  BP: (!) 147/96 (!) 145/94  Pulse: 100 (!) 102  Weight: 89 kg (196 lb 1.6 oz)     Fetal Status: Fetal Heart Rate (bpm): 145 Fundal Height: 37 cm Movement: Present     General:  Alert, oriented and cooperative. Patient is in no acute distress.  Skin: Skin is warm and dry. No rash noted.   Cardiovascular: Normal heart rate noted  Respiratory: Normal respiratory effort, no problems with respiration noted  Abdomen: Soft, gravid, appropriate for gestational age. Pain/Pressure: Present     Pelvic: Vag. Bleeding: None     Cervical exam performed Dilation: 1 Effacement (%): 40 Station: Ballotable  Extremities: Normal range of motion.  Edema: None  Mental Status: Normal mood and affect. Normal behavior. Normal judgment and thought content.   Urinalysis:      Assessment and Plan:  Pregnancy: [id number] at [redacted]w[redacted]d  1. Supervision of high risk pregnancy in third  trimester Doing well. Continue routine care. GBS and cultures collected. Will check CBC and HIV since she did not get with 28 week labs. 2hr GTT normal. Following with MFM.   2. Chronic hypertension affecting pregnancy BPs elevated. Will start low dose labetalol today. Patient taking ASA. Receiving antepartum testing.   3. Thyroid dysfunction, antepartum Repeat labs drawn at last visit. Meeting criteria for subclinical hyperthyroidism. Asymptomatic.   4. Advanced maternal age in multigravida, second trimester Low risk panorama  5. Unwanted fertility Desires BTL. Consent signed 09/09/17.   6. Depression affecting pregnancy in third trimester, antepartum Continue Zoloft  7. Drug abuse during pregnancy [facility]) UDS collected today due to h/o drug use and last UDS positive.  - Drug Screen, Urine   Preterm labor symptoms and general obstetric precautions including but not limited to vaginal bleeding, contractions, leaking of fluid and fetal movement were reviewed in detail with the patient. Please refer to After Visit Summary for other counseling recommendations.  Return in about 1 week (around 12/01/2017) for [place].   Katheren Shams, DO

## 2017-11-24 NOTE — Patient Instructions (Signed)
Hyperthyroidism Hyperthyroidism is when the thyroid is too active (overactive). Your thyroid is a large gland that is located in your neck. The thyroid helps to control how your body uses food (metabolism). When your thyroid is overactive, it produces too much of a hormone called thyroxine. What are the causes? Causes of hyperthyroidism may include:  Graves disease. This is when your immune system attacks the thyroid gland. This is the most common cause.  Inflammation of the thyroid gland.  Tumor in the thyroid gland or somewhere else.  Excessive use of thyroid medicines, including: ? Prescription thyroid supplement. ? Herbal supplements that mimic thyroid hormones.  Solid or fluid-filled lumps within your thyroid gland (thyroid nodules).  Excessive ingestion of iodine.  What increases the risk?  Being female.  Having a family history of thyroid conditions. What are the signs or symptoms? Signs and symptoms of hyperthyroidism may include:  Nervousness.  Inability to tolerate heat.  Unexplained weight loss.  Diarrhea.  Change in the texture of hair or skin.  Heart skipping beats or making extra beats.  Rapid heart rate.  Loss of menstruation.  Shaky hands.  Fatigue.  Restlessness.  Increased appetite.  Sleep problems.  Enlarged thyroid gland or nodules.  How is this diagnosed? Diagnosis of hyperthyroidism may include:  Medical history and physical exam.  Blood tests.  Ultrasound tests.  How is this treated? Treatment may include:  Medicines to control your thyroid.  Surgery to remove your thyroid.  Radiation therapy.  Follow these instructions at home:  Take medicines only as directed by your health care provider.  Do not use any tobacco products, including cigarettes, chewing tobacco, or electronic cigarettes. If you need help quitting, ask your health care provider.  Do not exercise or do physical activity until your health care provider  approves.  Keep all follow-up appointments as directed by your health care provider. This is important. Contact a health care provider if:  Your symptoms do not get better with treatment.  You have fever.  You are taking thyroid replacement medicine and you: ? Have depression. ? Feel mentally and physically slow. ? Have weight gain. Get help right away if:  You have decreased alertness or a change in your awareness.  You have abdominal pain.  You feel dizzy.  You have a rapid heartbeat.  You have an irregular heartbeat. This information is not intended to replace advice given to you by your health care provider. Make sure you discuss any questions you have with your health care provider. Document Released: 09/01/2005 Document Revised: 01/31/2016 Document Reviewed: 01/17/2014 Elsevier Interactive Patient Education  2018 [employer]  

## 2017-11-25 ENCOUNTER — Ambulatory Visit (HOSPITAL_COMMUNITY)
Admission: RE | Admit: 2017-11-25 | Discharge: 2017-11-25 | Disposition: A | Discharge status: Home / Self Care | Payer: Medicaid Other | Source: Ambulatory Visit | Attending: Obstetrics and Gynecology | Admitting: Obstetrics and Gynecology

## 2017-11-25 ENCOUNTER — Encounter (HOSPITAL_COMMUNITY): Payer: Self-pay

## 2017-11-25 DIAGNOSIS — O10919 Unspecified pre-existing hypertension complicating pregnancy, unspecified trimester: Secondary | ICD-10-CM | POA: Diagnosis not present

## 2017-11-25 DIAGNOSIS — Z3A35 35 weeks gestation of pregnancy: Secondary | ICD-10-CM | POA: Insufficient documentation

## 2017-11-25 LAB — CBC
HEMOGLOBIN: 10.5 g/dL — AB (ref 11.1–15.9)
Hematocrit: 32.4 % — ABNORMAL LOW (ref 34.0–46.6)
MCH: 28.6 pg (ref 26.6–33.0)
MCHC: 32.4 g/dL (ref 31.5–35.7)
MCV: 88 fL (ref 79–97)
PLATELETS: 209 10*3/uL (ref 150–379)
RBC: 3.67 x10E6/uL — ABNORMAL LOW (ref 3.77–5.28)
RDW: 15 % (ref 12.3–15.4)
WBC: 8.4 10*3/uL (ref 3.4–10.8)

## 2017-11-25 LAB — HIV ANTIBODY (ROUTINE TESTING W REFLEX): HIV Screen 4th Generation wRfx: NONREACTIVE

## 2017-11-25 LAB — DRUG SCREEN, URINE
Amphetamines, Urine: NEGATIVE ng/mL
Barbiturate screen, urine: NEGATIVE ng/mL
Benzodiazepine Quant, Ur: NEGATIVE ng/mL
CANNABINOID QUANT UR: NEGATIVE ng/mL
Cocaine (Metab.): NEGATIVE ng/mL
Opiate Quant, Ur: NEGATIVE ng/mL
PCP QUANT UR: NEGATIVE ng/mL

## 2017-11-25 LAB — GC/CHLAMYDIA PROBE AMP (~~LOC~~) NOT AT ARMC
CHLAMYDIA, DNA PROBE: NEGATIVE
Neisseria Gonorrhea: NEGATIVE

## 2017-11-25 LAB — OB RESULTS CONSOLE GC/CHLAMYDIA: GC PROBE AMP, GENITAL: NEGATIVE

## 2017-11-28 LAB — CULTURE, BETA STREP (GROUP B ONLY): STREP GP B CULTURE: POSITIVE — AB

## 2017-11-30 ENCOUNTER — Emergency Department (HOSPITAL_COMMUNITY)
Admission: EM | Admit: 2017-11-30 | Discharge: 2017-11-30 | Disposition: A | Discharge status: Home / Self Care | Payer: Medicaid Other | Attending: Emergency Medicine | Admitting: Emergency Medicine

## 2017-11-30 ENCOUNTER — Other Ambulatory Visit: Payer: Self-pay

## 2017-11-30 ENCOUNTER — Encounter (HOSPITAL_COMMUNITY): Payer: Self-pay | Admitting: *Deleted

## 2017-11-30 DIAGNOSIS — Z87891 Personal history of nicotine dependence: Secondary | ICD-10-CM | POA: Diagnosis not present

## 2017-11-30 DIAGNOSIS — Z79899 Other long term (current) drug therapy: Secondary | ICD-10-CM | POA: Insufficient documentation

## 2017-11-30 DIAGNOSIS — Z7982 Long term (current) use of aspirin: Secondary | ICD-10-CM | POA: Diagnosis not present

## 2017-11-30 DIAGNOSIS — E039 Hypothyroidism, unspecified: Secondary | ICD-10-CM | POA: Insufficient documentation

## 2017-11-30 DIAGNOSIS — J069 Acute upper respiratory infection, unspecified: Secondary | ICD-10-CM

## 2017-11-30 DIAGNOSIS — Z3A35 35 weeks gestation of pregnancy: Secondary | ICD-10-CM | POA: Insufficient documentation

## 2017-11-30 DIAGNOSIS — O09523 Supervision of elderly multigravida, third trimester: Secondary | ICD-10-CM | POA: Diagnosis not present

## 2017-11-30 DIAGNOSIS — O99283 Endocrine, nutritional and metabolic diseases complicating pregnancy, third trimester: Secondary | ICD-10-CM | POA: Diagnosis not present

## 2017-11-30 DIAGNOSIS — O9989 Other specified diseases and conditions complicating pregnancy, childbirth and the puerperium: Secondary | ICD-10-CM | POA: Diagnosis present

## 2017-11-30 DIAGNOSIS — O10013 Pre-existing essential hypertension complicating pregnancy, third trimester: Secondary | ICD-10-CM | POA: Diagnosis not present

## 2017-11-30 LAB — RAPID STREP SCREEN (MED CTR MEBANE ONLY): STREPTOCOCCUS, GROUP A SCREEN (DIRECT): NEGATIVE

## 2017-11-30 NOTE — ED Notes (Signed)
Pt needed to leave to catch the bus prior to discharge vitals. Instructions and return precautions reviewed with patient. Patient left ED with steady gait, NAD.

## 2017-11-30 NOTE — ED Provider Notes (Signed)
[place] EMERGENCY DEPARTMENT Provider Note   CSN: [id number] Arrival date & time: 11/30/17  0745     History   Chief Complaint Chief Complaint  Patient presents with  . Sore Throat    HPI Susan Walton is a 37 y.o. female approximately [redacted] weeks pregnant who presents today complaining of URI symptoms for the past 4 days.  Patient states she is experiencing congestion, rhinorrhea, sore throat, L ear pain, subjective fever, and productive cough with green mucus sputum.  Patient states the most nagging of her symptoms is her sore throat.  States is a constant discomfort, worse with swallowing, however she is able to swallow.  She has tried Tylenol and OTC spray without significant relief of her symptoms.  Denies dyspnea, chest pain, or abdominal pain.  Patient has no complaints related to her pregnancy at this time.  Her last OB appointment was 5 days prior.  HPI  Past Medical History:  Diagnosis Date  . Anemia   . Fibroid   . Hypertension   . Retinal detachment   . Thyroid disease    Hypothyroidism    Patient Active Problem List   Diagnosis Date Noted  . 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 Pregnancy, supervision, high-risk 07/27/2017  . Chronic hypertension affecting pregnancy 07/27/2017  . Drug abuse during pregnancy ([place]) 07/27/2017  . Thyroid dysfunction, antepartum 07/27/2017  . History of trichomoniasis 07/27/2017  . Unwanted fertility 07/27/2017  . Advanced maternal age in multigravida, second trimester   . Adjustment disorder with depressed mood 06/05/2017    Past Surgical History:  Procedure Laterality Date  . DILATION AND CURETTAGE OF UTERUS    . EYE SURGERY    . NO PAST SURGERIES      OB History    Gravida Para Term Preterm AB Living   8 4 4  0 3 4   SAB TAB Ectopic Multiple Live Births   2 1 0   4       Home Medications    Prior to Admission medications   Medication Sig Start Date End  Date Taking? Authorizing Provider  acetaminophen (TYLENOL) 325 MG tablet Take 650 mg by mouth every 6 (six) hours as needed for moderate pain or headache.    [provider]  aspirin 81 MG tablet Take 1 tablet (81 mg total) by mouth daily. 10/20/17   Moss, Amber, DO  IRON PO Take by mouth.    [provider]  labetalol (NORMODYNE) 200 MG tablet Take 1 tablet (200 mg total) by mouth 2 (two) times daily. 11/24/17   Katheren Shams, DO  Loratadine-Pseudoephedrine (LORATADINE-D 24HR PO) Take 1 tablet by mouth daily.    [provider]  metroNIDAZOLE (FLAGYL) 500 MG tablet Take 1 tablet (500 mg total) by mouth 2 (two) times daily. For positive BV test Patient not taking: Reported on 11/24/2017 11/12/17   Wynona Luna, MD  Prenatal Vit-Fe Fumarate-FA (NAT-RUL PRENATAL VITAMINS) 28-0.8 MG TABS Take 1 tablet by mouth daily.    [provider]  sertraline (ZOLOFT) 25 MG tablet Take 1 tablet (25 mg total) by mouth daily. Patient not taking: Reported on 11/11/2017 10/20/17   Dannielle Huh, DO    Family History History reviewed. No pertinent family history.  Social History Social History   Tobacco Use  . Smoking status: Former Smoker    Packs/day: 0.50    Types: Cigarettes  . Smokeless tobacco: Never Used  Substance Use Topics  .  Alcohol use: Yes    Comment: quit mth after +UPT  . Drug use: No    Comment: quit 1 mth after +UPT     Allergies   Patient has no known allergies.   Review of Systems Review of Systems  Constitutional: Positive for fever (subjective).  HENT: Positive for congestion, ear pain (L), rhinorrhea and sore throat.   Eyes: Negative for visual disturbance.  Respiratory: Negative for shortness of breath.   Cardiovascular: Negative for chest pain.  Neurological: Negative for headaches.    Physical Exam Updated Vital Signs BP (!) 142/84 (BP Location: Right Arm)   Pulse (!) 101   Temp 98.2 F (36.8 C) (Oral)   Resp 16   Ht 5\' 8"   (1.727 m)   Wt 88.5 kg (195 lb)   LMP 03/15/2017   SpO2 100%   BMI 29.65 kg/m   Physical Exam  Constitutional: She appears well-developed and well-nourished.  Non-toxic appearance. No distress.  HENT:  Head: Normocephalic and atraumatic.  Right Ear: Tympanic membrane is not perforated, not erythematous, not retracted and not bulging.  Left Ear: Tympanic membrane is not perforated, not erythematous, not retracted and not bulging.  Nose: Mucosal edema (and congestion) present.  Mouth/Throat: Uvula is midline. Posterior oropharyngeal erythema present. No oropharyngeal exudate. Tonsils are 2+ on the right. Tonsils are 2+ on the left.  Patient is tolerating her own secretions without difficulty.  No trismus.  No hot potato voice.  No drooling.  Submandibular compartment is soft.  Eyes: Conjunctivae are normal. Pupils are equal, round, and reactive to light. Right eye exhibits no discharge. Left eye exhibits no discharge.  Neck: Normal range of motion. Neck supple.  Cardiovascular: Normal rate and regular rhythm.  No murmur heard. Pulmonary/Chest: Breath sounds normal. No respiratory distress. She has no wheezes. She has no rales.  Abdominal: Soft. She exhibits no distension. There is no tenderness.  Lymphadenopathy:    She has cervical adenopathy (mild bilateral).  Neurological: She is alert.  Skin: Skin is warm and dry. No rash noted.  Psychiatric: She has a normal mood and affect. Her behavior is normal.  Nursing note and vitals reviewed.    ED Treatments / Results  Labs Results for orders placed or performed during the hospital encounter of 11/30/17  Rapid strep screen  Result Value Ref Range   Streptococcus, Group A Screen (Direct) NEGATIVE NEGATIVE   EKG  EKG Interpretation None      Radiology No results found.  Procedures Procedures (including critical care time)  Medications Ordered in ED Medications - No data to display   Initial Impression / Assessment and  Plan / ED Course  I have reviewed the triage vital signs and the nursing notes.  Pertinent labs & imaging results that were available during my care of the patient were reviewed by me and considered in my medical decision making (see chart for details).   Patient presents with URI sxs. She is nontoxic appearing, in no apparent distress. Patient is afebrile and without adventitious sounds on lung exam, doubt PNA. No wheezing on exam. Afebrile, no sinus tenderness, doubt sinusitis.Rapid strep ordered and negative, culture pending, no indication of RPA/PTA. Suspect viral etiology recommended nasal saline sprays and continued use of tylenol. I discussed results, treatment plan, need for PCP follow-up, and return precautions with the patient. Provided opportunity for questions, patient confirmed understanding and is in agreement with plan.    Final Clinical Impressions(s) / ED Diagnoses   Final diagnoses:  Upper  respiratory tract infection, unspecified type    ED Discharge Orders    None       Leafy Kindle 11/30/17 1249    Jola Schmidt, MD 11/30/17 1535

## 2017-11-30 NOTE — ED Triage Notes (Signed)
PT with cough, sore throat, rhinitis.  [redacted] weeks pregnant, but no complaints regarding pregnancy.

## 2017-11-30 NOTE — Discharge Instructions (Signed)
You were seen in the emergency department today for upper respiratory infection symptoms.  Your rapid strep test was negative, we will call you with the culture comes back positive.  Continue to use Tylenol per over-the-counter dosing instructions for discomfort.  Use nasal saline sprays which you can also get over-the-counter for congestion.  Follow-up with your OB/GYN and primary care provider within the next 3-5 days for reevaluation.  Return to the emergency department for new or worsening symptoms.

## 2017-12-02 ENCOUNTER — Encounter (HOSPITAL_COMMUNITY): Payer: Self-pay

## 2017-12-02 ENCOUNTER — Ambulatory Visit (HOSPITAL_COMMUNITY)
Admission: RE | Admit: 2017-12-02 | Discharge: 2017-12-02 | Disposition: A | Discharge status: Home / Self Care | Payer: Medicaid Other | Source: Ambulatory Visit | Attending: Obstetrics and Gynecology | Admitting: Obstetrics and Gynecology

## 2017-12-02 DIAGNOSIS — O10913 Unspecified pre-existing hypertension complicating pregnancy, third trimester: Secondary | ICD-10-CM | POA: Insufficient documentation

## 2017-12-02 DIAGNOSIS — O10919 Unspecified pre-existing hypertension complicating pregnancy, unspecified trimester: Secondary | ICD-10-CM

## 2017-12-02 DIAGNOSIS — Z3A36 36 weeks gestation of pregnancy: Secondary | ICD-10-CM | POA: Diagnosis not present

## 2017-12-02 LAB — CULTURE, GROUP A STREP (THRC)

## 2017-12-09 ENCOUNTER — Ambulatory Visit (HOSPITAL_COMMUNITY): Admission: RE | Admit: 2017-12-09 | Payer: Medicaid Other | Source: Ambulatory Visit

## 2017-12-09 ENCOUNTER — Encounter (HOSPITAL_COMMUNITY): Payer: Self-pay

## 2017-12-09 ENCOUNTER — Inpatient Hospital Stay (EMERGENCY_DEPARTMENT_HOSPITAL)
Admission: AD | Admit: 2017-12-09 | Discharge: 2017-12-09 | Disposition: A | Discharge status: Home / Self Care | Payer: Medicaid Other | Source: Ambulatory Visit | Attending: Obstetrics & Gynecology | Admitting: Obstetrics & Gynecology

## 2017-12-09 ENCOUNTER — Inpatient Hospital Stay (HOSPITAL_COMMUNITY): Payer: Medicaid Other

## 2017-12-09 DIAGNOSIS — O0933 Supervision of pregnancy with insufficient antenatal care, third trimester: Secondary | ICD-10-CM | POA: Insufficient documentation

## 2017-12-09 DIAGNOSIS — O10913 Unspecified pre-existing hypertension complicating pregnancy, third trimester: Secondary | ICD-10-CM | POA: Diagnosis not present

## 2017-12-09 DIAGNOSIS — F1910 Other psychoactive substance abuse, uncomplicated: Secondary | ICD-10-CM | POA: Insufficient documentation

## 2017-12-09 DIAGNOSIS — O09523 Supervision of elderly multigravida, third trimester: Secondary | ICD-10-CM | POA: Insufficient documentation

## 2017-12-09 DIAGNOSIS — O10013 Pre-existing essential hypertension complicating pregnancy, third trimester: Secondary | ICD-10-CM

## 2017-12-09 DIAGNOSIS — E0590 Thyrotoxicosis, unspecified without thyrotoxic crisis or storm: Secondary | ICD-10-CM

## 2017-12-09 DIAGNOSIS — Z3A37 37 weeks gestation of pregnancy: Secondary | ICD-10-CM | POA: Insufficient documentation

## 2017-12-09 DIAGNOSIS — O99283 Endocrine, nutritional and metabolic diseases complicating pregnancy, third trimester: Secondary | ICD-10-CM | POA: Insufficient documentation

## 2017-12-09 DIAGNOSIS — O0993 Supervision of high risk pregnancy, unspecified, third trimester: Secondary | ICD-10-CM

## 2017-12-09 DIAGNOSIS — O368390 Maternal care for abnormalities of the fetal heart rate or rhythm, unspecified trimester, not applicable or unspecified: Secondary | ICD-10-CM

## 2017-12-09 DIAGNOSIS — O99323 Drug use complicating pregnancy, third trimester: Secondary | ICD-10-CM

## 2017-12-09 DIAGNOSIS — O289 Unspecified abnormal findings on antenatal screening of mother: Secondary | ICD-10-CM | POA: Insufficient documentation

## 2017-12-09 DIAGNOSIS — Z3689 Encounter for other specified antenatal screening: Secondary | ICD-10-CM

## 2017-12-09 LAB — CBC
HEMATOCRIT: 32.2 % — AB (ref 36.0–46.0)
HEMOGLOBIN: 10.7 g/dL — AB (ref 12.0–15.0)
MCH: 28.4 pg (ref 26.0–34.0)
MCHC: 33.2 g/dL (ref 30.0–36.0)
MCV: 85.4 fL (ref 78.0–100.0)
Platelets: 217 10*3/uL (ref 150–400)
RBC: 3.77 MIL/uL — ABNORMAL LOW (ref 3.87–5.11)
RDW: 15.1 % (ref 11.5–15.5)
WBC: 7.5 10*3/uL (ref 4.0–10.5)

## 2017-12-09 LAB — COMPREHENSIVE METABOLIC PANEL
ALT: 10 U/L — ABNORMAL LOW (ref 14–54)
ANION GAP: 8 (ref 5–15)
AST: 18 U/L (ref 15–41)
Albumin: 2.9 g/dL — ABNORMAL LOW (ref 3.5–5.0)
Alkaline Phosphatase: 124 U/L (ref 38–126)
BILIRUBIN TOTAL: 0.2 mg/dL — AB (ref 0.3–1.2)
BUN: 6 mg/dL (ref 6–20)
CALCIUM: 8.5 mg/dL — AB (ref 8.9–10.3)
CO2: 18 mmol/L — ABNORMAL LOW (ref 22–32)
Chloride: 106 mmol/L (ref 101–111)
Creatinine, Ser: 0.49 mg/dL (ref 0.44–1.00)
GFR calc Af Amer: >60 mL/min (ref >60)
Glucose, Bld: 101 mg/dL — ABNORMAL HIGH (ref 65–99)
POTASSIUM: 3.9 mmol/L (ref 3.5–5.1)
Sodium: 132 mmol/L — ABNORMAL LOW (ref 135–145)
TOTAL PROTEIN: 6.7 g/dL (ref 6.5–8.1)

## 2017-12-09 LAB — URINALYSIS, ROUTINE W REFLEX MICROSCOPIC
Bilirubin Urine: NEGATIVE
GLUCOSE, UA: NEGATIVE mg/dL
Hgb urine dipstick: NEGATIVE
KETONES UR: NEGATIVE mg/dL
Nitrite: NEGATIVE
PROTEIN: NEGATIVE mg/dL
Specific Gravity, Urine: 1.009 (ref 1.005–1.030)
pH: 7 (ref 5.0–8.0)

## 2017-12-09 LAB — PROTEIN / CREATININE RATIO, URINE
Creatinine, Urine: 78 mg/dL
PROTEIN CREATININE RATIO: 0.1 mg/mg{creat} (ref 0.00–0.15)
Total Protein, Urine: 8 mg/dL

## 2017-12-09 MED ORDER — LACTATED RINGERS IV BOLUS
1000.0000 mL | Freq: Once | INTRAVENOUS | Status: AC
  Administered 2017-12-09: 1000 mL via INTRAVENOUS

## 2017-12-09 NOTE — MAU Note (Signed)
Pt reports blurred vision today, has history of elevated b/p with this pregnancy. Occasional uc.

## 2017-12-09 NOTE — MAU Note (Signed)
Urine in lab 

## 2017-12-09 NOTE — Discharge Instructions (Signed)

## 2017-12-09 NOTE — MAU Provider Note (Signed)
History  CSN: [id number] Arrival date and time: 12/09/17 1024  First Provider Initiated Contact with Patient 12/09/17 1119      Chief Complaint  Patient presents with  . Blurred Vision  . Contractions    HPI: Susan Walton is a 37 y.o. [id number] with IUP at [redacted]w[redacted]d who presents to maternity admissions reporting contractions and blurry vision. Pregnancy complicated by chronic hypertension. She was prescribed labetalol last office visit, but reports that she is not taking because she read about side effect online. Reports having somewhat blurry vision since she woke up this morning. Felt somewhat dizzy as well, but this is resolved. Denies scotomata. Also denies headache, RUQ/epigastric pain or swelling. Reports she has been contracting about every 10 minutes. No LOF or vaginal bleeding.    Also denies any abnormal vaginal discharge, vaginal bleeding, fevers, chills, malaise, dysuria, hematuria, urinary frequency, nausea, vomiting, diarrhea.   OB History  Gravida Para Term Preterm AB Living  8 4 4  0 3 4  SAB TAB Ectopic Multiple Live Births  2 1 0   4    # Outcome Date GA Lbr Len/2nd Weight Sex Delivery Anes PTL Lv  8 Current           7 Term 08/19/14 [redacted]w[redacted]d  7 lb (3.175 kg) F Vag-Spont EPI N LIV     Complications: Placenta previa  6 SAB 2012          5 TAB 2010          4 Term 10/20/05 [redacted]w[redacted]d  6 lb (2.722 kg) F Vag-Spont EPI N LIV  3 Term 04/15/02 [redacted]w[redacted]d  7 lb (3.175 kg) F Vag-Spont EPI N LIV  2 Term 07/31/00 [redacted]w[redacted]d  7 lb (3.175 kg) F Vag-Spont EPI N LIV  1 SAB            Past Medical History:  Diagnosis Date  . Anemia   . Fibroid   . Hypertension   . Retinal detachment   . Thyroid disease    Hypothyroidism   Past Surgical History:  Procedure Laterality Date  . DILATION AND CURETTAGE OF UTERUS    . EYE SURGERY    . NO PAST SURGERIES     History reviewed. No pertinent family history. Social History   Socioeconomic History  . Marital status: Single    Spouse name: Not on  file  . Number of children: Not on file  . Years of education: Not on file  . Highest education level: Not on file  Occupational History  . Not on file  Social Needs  . Financial resource strain: Not on file  . Food insecurity:    Worry: Not on file    Inability: Not on file  . Transportation needs:    Medical: Not on file    Non-medical: Not on file  Tobacco Use  . Smoking status: Former Smoker    Packs/day: 0.50    Types: Cigarettes  . Smokeless tobacco: Never Used  Substance and Sexual Activity  . Alcohol use: Not Currently    Comment: quit mth after +UPT  . Drug use: No    Types: Marijuana, Cocaine    Comment: quit 1 mth after +UPT  . Sexual activity: Yes  Lifestyle  . Physical activity:    Days per week: Not on file    Minutes per session: Not on file  . Stress: Not on file  Relationships  . Social connections:    Talks on phone: Not on file  Gets together: Not on file    Attends religious service: Not on file    Active member of club or organization: Not on file    Attends meetings of clubs or organizations: Not on file    Relationship status: Not on file  . Intimate partner violence:    Fear of current or ex partner: Not on file    Emotionally abused: Not on file    Physically abused: Not on file    Forced sexual activity: Not on file  Other Topics Concern  . Not on file  Social History Narrative   ** Merged History Encounter **       No Known Allergies  Medications Prior to Admission  Medication Sig Dispense Refill Last Dose  . diphenhydrAMINE (BENADRYL) 25 MG tablet Take 25 mg by mouth every 6 (six) hours as needed for sleep.   11/25/2017  . Prenatal Vit-Fe Fumarate-FA (NAT-RUL PRENATAL VITAMINS) 28-0.8 MG TABS Take 1 tablet by mouth daily.   12/09/2017 at Unknown time  . acetaminophen (TYLENOL) 325 MG tablet Take 650 mg by mouth every 6 (six) hours as needed for moderate pain or headache.   12/04/2017  . aspirin 81 MG tablet Take 1 tablet (81 mg  total) by mouth daily. (Patient not taking: Reported on 12/02/2017) 30 tablet 1 Not Taking  . labetalol (NORMODYNE) 200 MG tablet Take 1 tablet (200 mg total) by mouth 2 (two) times daily. (Patient not taking: Reported on 12/02/2017) 60 tablet 1 Not Taking    I have reviewed patient's Past Medical Hx, Surgical Hx, Family Hx, Social Hx, medications and allergies.   Review of Systems: Negative except for what is mentioned in HPI.  Physical Exam   Patient Vitals for the past 4 hrs:  BP Temp Pulse Resp SpO2 Height Weight  12/09/17 1246 132/87 - 92 - - - -  12/09/17 1231 133/90 - 90 - - - -  12/09/17 1216 135/88 - 92 - - - -  12/09/17 1201 136/89 - 91 - - - -  12/09/17 1146 (!) 150/94 - 86 - - - -  12/09/17 1131 (!) 150/95 - 87 - - - -  12/09/17 1116 (!) 167/99 - 90 - - - -  12/09/17 1050 (!) 154/96 98.7 F (37.1 C) 98 18 100 % 5\' 8"  (1.727 m) 201 lb (91.2 kg)   Constitutional: Well-developed, well-nourished female in no acute distress.  HENT: [place]/AT, normal oropharynx mucosa. MMM Eyes: normal conjunctivae, no scleral icterus. PERRL, EOMI Cardiovascular: normal rate, regular rhythm Respiratory: normal effort, lungs CTAB.  GI: Abd soft, non-tender, gravid appropriate for gestational age. No RUQ or epigastric tenderness.   GU: Neg CVAT. SVE: 1cm/thick/high MSK: Extremities nontender, no edema Neurologic: Alert and oriented x 4. DTRs 2+ bilaterally Psych: Normal mood and affect Skin: warm and dry   FHT:  Baseline 150 , moderate variability, accelerations present, no decelerations Toco: Contractions: occasional  MAU Course/MDM:   --Nursing notes and VS reviewed. --Patient seen and examined, as noted above.  --Reactive NST --1 severe range BP pressure as noted above, all others normal to mild range.  --CBC, CMP, UPC ordered.  Results reviewed:  Results for orders placed or performed during the hospital encounter of 12/09/17  CBC  Result Value Ref Range   WBC 7.5 4.0 - 10.5 K/uL    RBC 3.77 (L) 3.87 - 5.11 MIL/uL   Hemoglobin 10.7 (L) 12.0 - 15.0 g/dL   HCT 32.2 (L) 36.0 - 46.0 %  MCV 85.4 78.0 - 100.0 fL   MCH 28.4 26.0 - 34.0 pg   MCHC 33.2 30.0 - 36.0 g/dL   RDW 15.1 11.5 - 15.5 %   Platelets 217 150 - 400 K/uL  Comprehensive metabolic panel  Result Value Ref Range   Sodium 132 (L) 135 - 145 mmol/L   Potassium 3.9 3.5 - 5.1 mmol/L   Chloride 106 101 - 111 mmol/L   CO2 18 (L) 22 - 32 mmol/L   Glucose, Bld 101 (H) 65 - 99 mg/dL   BUN 6 6 - 20 mg/dL   Creatinine, Ser 0.49 0.44 - 1.00 mg/dL   Calcium 8.5 (L) 8.9 - 10.3 mg/dL   Total Protein 6.7 6.5 - 8.1 g/dL   Albumin 2.9 (L) 3.5 - 5.0 g/dL   AST 18 15 - 41 U/L   ALT 10 (L) 14 - 54 U/L   Alkaline Phosphatase 124 38 - 126 U/L   Total Bilirubin 0.2 (L) 0.3 - 1.2 mg/dL   GFR calc non Af Amer >60 >60 mL/min   GFR calc Af Amer >60 >60 mL/min   Anion gap 8 5 - 15  Protein / creatinine ratio, urine  Result Value Ref Range   Creatinine, Urine 78.00 mg/dL   Total Protein, Urine 8 mg/dL   Protein Creatinine Ratio 0.10 0.00 - 0.15 mg/mg[Cre]  Urinalysis, Routine w reflex microscopic  Result Value Ref Range   Color, Urine YELLOW YELLOW   APPearance CLEAR CLEAR   Specific Gravity, Urine 1.009 1.005 - 1.030   pH 7.0 5.0 - 8.0   Glucose, UA NEGATIVE NEGATIVE mg/dL   Hgb urine dipstick NEGATIVE NEGATIVE   Bilirubin Urine NEGATIVE NEGATIVE   Ketones, ur NEGATIVE NEGATIVE mg/dL   Protein, ur NEGATIVE NEGATIVE mg/dL   Nitrite NEGATIVE NEGATIVE   Leukocytes, UA SMALL (A) NEGATIVE   RBC / HPF 0-5 0 - 5 RBC/hpf   WBC, UA 0-5 0 - 5 WBC/hpf   Bacteria, UA RARE (A) NONE SEEN   Squamous Epithelial / LPF 0-5 (A) NONE SEEN   Mucus PRESENT    --All labs wnl as above. --Discussed with Dr. Ernestina Patches. Plan to d/c home with precautions, and BP check tomorrow.  --Prior to discharging, pt with one prolonged deceleration, ~7 min. BPP ordred.    [place] MFM FETAL BPP WO NON  STRESS ----------------------------------------------------------------------  OBSTETRICS REPORT                      (Signed Final 12/09/2017 02:16 pm) ---------------------------------------------------------------------- Patient Info  ID #:       [id number]                          D.O.B.:  Oct 03, 1980 (36 yrs)  Name:       Susan Walton                    Visit Date: 12/09/2017 01:44 pm ---------------------------------------------------------------------- Performed By  Performed By:     Berlinda Last          Ref. Address:     [address]  [place]                                                             [phone]  Attending:        Griffin Dakin MD         Location:         [facility]  Referred By:      Jolaine Click NP ---------------------------------------------------------------------- Orders   #  Description                                 Code   1  [place] MFM FETAL BPP WO NON STRESS              76819.01  ----------------------------------------------------------------------   #  Ordered By               Order #        Accession #    Episode #   1  Noni Saupe           [id number]      [id number]     [id number]  ---------------------------------------------------------------------- Indications   [redacted] weeks gestation of pregnancy                Z3A.37   Non-reactive NST, FHR decelerations            O28.9   Substance abuse affecting pregnancy,           O99.320 F19.10   antepartum; alcohol   Hyperthyroid                                   O99.280 E05.90   Advanced maternal age multigravida 50+,        O34.523   third trimester (37yrs old, low risk NIPS)   Hypertension - Chronic/Pre-existing            O10.019   Late to prenatal care, third trimester         O09.33   ---------------------------------------------------------------------- OB History  Blood Type:            Height:  5'8"   Weight (lb):  170       BMI:  25.85  Gravidity:    8         Term:   4        Prem:   0        SAB:   2  TOP:          1       Ectopic:  0        Living: 4 ---------------------------------------------------------------------- Fetal Evaluation  Num Of Fetuses:     1  Fetal  Heart         154  Rate(bpm):  Cardiac Activity:   Observed  Presentation:       Cephalic  Amniotic Fluid  AFI FV:      Subjectively within normal limits  AFI Sum(cm)     %Tile       Largest Pocket(cm)  16.05           62          5.43  RUQ(cm)       RLQ(cm)       LUQ(cm)        LLQ(cm)  4.77          3.32          5.43           2.53  Comment:    8/8 BPP in 5 minutes. ---------------------------------------------------------------------- Biophysical Evaluation  Amniotic F.V:   Within normal limits       F. Tone:        Observed  F. Movement:    Observed                   Score:          8/8  F. Breathing:   Observed ---------------------------------------------------------------------- Gestational Age  LMP:           42w 5d        Date:  02/13/17                 EDD:   11/20/17  Best:          37w 5d     Det. By:  Previous Ultrasound      EDD:   12/25/17                                      (05/12/17) ---------------------------------------------------------------------- Impression  Intrauterine pregnancy at 37+5 weeks with [facility] and thyroid  disease, with FHR decelerations in the MAU; BPP requested  Normal amniotic fluid  BPP 8/8 ---------------------------------------------------------------------- Recommendations  Continue clinical evaluation and management. ----------------------------------------------------------------------                 Griffin Dakin, MD Electronically Signed Final Report   12/09/2017 02:16  pm ----------------------------------------------------------------------   --BPP 8/8. And Tracing Cat I and reactive again. Discussed all results and reviewed tracing with Dr. Ernestina Patches. Okay do d/c home with return precautions and close follow as previously planned.   Assessment and Plan  Assessment: 1. Maternal chronic hypertension in third trimester   2. [redacted] weeks gestation of pregnancy   3. Antepartum variable deceleration   4. Supervision of high risk pregnancy in third trimester   5. NST (non-stress test) reactive     Plan: --Discharge home in stable condition.  --Follow up tomorrow in the office for BP check and regular prenatal visit. Message sent to provider who will see her. --Severe preE symptoms precautions reviewed with patient.  --Labor precautions and fetal kick counts     Degele, Jenne Pane, MD 12/09/2017 2:22 PM

## 2017-12-10 ENCOUNTER — Encounter (HOSPITAL_COMMUNITY): Payer: Self-pay | Admitting: *Deleted

## 2017-12-10 ENCOUNTER — Inpatient Hospital Stay (HOSPITAL_COMMUNITY)
Admission: AD | Admit: 2017-12-10 | Discharge: 2017-12-13 | DRG: 797 | Disposition: A | Discharge status: Home / Self Care | Payer: Medicaid Other | Source: Ambulatory Visit | Attending: Obstetrics and Gynecology | Admitting: Obstetrics and Gynecology

## 2017-12-10 ENCOUNTER — Ambulatory Visit (INDEPENDENT_AMBULATORY_CARE_PROVIDER_SITE_OTHER): Payer: Medicaid Other | Admitting: Student

## 2017-12-10 VITALS — BP 169/103 | HR 72 | Wt 198.4 lb

## 2017-12-10 DIAGNOSIS — O99320 Drug use complicating pregnancy, unspecified trimester: Secondary | ICD-10-CM

## 2017-12-10 DIAGNOSIS — F1210 Cannabis abuse, uncomplicated: Secondary | ICD-10-CM | POA: Diagnosis present

## 2017-12-10 DIAGNOSIS — O1002 Pre-existing essential hypertension complicating childbirth: Secondary | ICD-10-CM | POA: Diagnosis present

## 2017-12-10 DIAGNOSIS — Z302 Encounter for sterilization: Secondary | ICD-10-CM

## 2017-12-10 DIAGNOSIS — O99820 Streptococcus B carrier state complicating pregnancy: Secondary | ICD-10-CM

## 2017-12-10 DIAGNOSIS — Z3A37 37 weeks gestation of pregnancy: Secondary | ICD-10-CM

## 2017-12-10 DIAGNOSIS — Z3009 Encounter for other general counseling and advice on contraception: Secondary | ICD-10-CM | POA: Diagnosis present

## 2017-12-10 DIAGNOSIS — O99324 Drug use complicating childbirth: Secondary | ICD-10-CM | POA: Diagnosis present

## 2017-12-10 DIAGNOSIS — O114 Pre-existing hypertension with pre-eclampsia, complicating childbirth: Secondary | ICD-10-CM | POA: Diagnosis present

## 2017-12-10 DIAGNOSIS — O119 Pre-existing hypertension with pre-eclampsia, unspecified trimester: Secondary | ICD-10-CM

## 2017-12-10 DIAGNOSIS — O09522 Supervision of elderly multigravida, second trimester: Secondary | ICD-10-CM

## 2017-12-10 DIAGNOSIS — O9822 Gonorrhea complicating childbirth: Secondary | ICD-10-CM | POA: Diagnosis present

## 2017-12-10 DIAGNOSIS — Z87891 Personal history of nicotine dependence: Secondary | ICD-10-CM | POA: Diagnosis not present

## 2017-12-10 DIAGNOSIS — O10913 Unspecified pre-existing hypertension complicating pregnancy, third trimester: Secondary | ICD-10-CM | POA: Diagnosis not present

## 2017-12-10 DIAGNOSIS — O98211 Gonorrhea complicating pregnancy, first trimester: Secondary | ICD-10-CM | POA: Diagnosis present

## 2017-12-10 DIAGNOSIS — O99824 Streptococcus B carrier state complicating childbirth: Secondary | ICD-10-CM | POA: Diagnosis present

## 2017-12-10 DIAGNOSIS — O1092 Unspecified pre-existing hypertension complicating childbirth: Secondary | ICD-10-CM | POA: Diagnosis present

## 2017-12-10 DIAGNOSIS — F1910 Other psychoactive substance abuse, uncomplicated: Secondary | ICD-10-CM | POA: Diagnosis present

## 2017-12-10 LAB — COMPREHENSIVE METABOLIC PANEL WITH GFR
ALT: 11 U/L — ABNORMAL LOW (ref 14–54)
AST: 17 U/L (ref 15–41)
Albumin: 3.2 g/dL — ABNORMAL LOW (ref 3.5–5.0)
Alkaline Phosphatase: 130 U/L — ABNORMAL HIGH (ref 38–126)
Anion gap: 6 (ref 5–15)
BUN: 5 mg/dL — ABNORMAL LOW (ref 6–20)
CO2: 21 mmol/L — ABNORMAL LOW (ref 22–32)
Calcium: 8.8 mg/dL — ABNORMAL LOW (ref 8.9–10.3)
Chloride: 106 mmol/L (ref 101–111)
Creatinine, Ser: 0.53 mg/dL (ref 0.44–1.00)
GFR calc Af Amer: >60 mL/min
GFR calc non Af Amer: >60 mL/min
Glucose, Bld: 82 mg/dL (ref 65–99)
Potassium: 4.2 mmol/L (ref 3.5–5.1)
Sodium: 133 mmol/L — ABNORMAL LOW (ref 135–145)
Total Bilirubin: 0.3 mg/dL (ref 0.3–1.2)
Total Protein: 7.1 g/dL (ref 6.5–8.1)

## 2017-12-10 LAB — POCT URINALYSIS DIP (DEVICE)
BILIRUBIN URINE: NEGATIVE
GLUCOSE, UA: NEGATIVE mg/dL
Hgb urine dipstick: NEGATIVE
Ketones, ur: NEGATIVE mg/dL
NITRITE: NEGATIVE
PH: 7.5 (ref 5.0–8.0)
Protein, ur: NEGATIVE mg/dL
Specific Gravity, Urine: 1.015 (ref 1.005–1.030)
Urobilinogen, UA: 0.2 mg/dL (ref 0.0–1.0)

## 2017-12-10 LAB — CBC
HCT: 35.2 % — ABNORMAL LOW (ref 36.0–46.0)
HCT: 33.3 % — ABNORMAL LOW (ref 36.0–46.0)
Hemoglobin: 11.8 g/dL — ABNORMAL LOW (ref 12.0–15.0)
Hemoglobin: 10.9 g/dL — ABNORMAL LOW (ref 12.0–15.0)
MCH: 29 pg (ref 26.0–34.0)
MCH: 28.3 pg (ref 26.0–34.0)
MCHC: 33.5 g/dL (ref 30.0–36.0)
MCHC: 32.7 g/dL (ref 30.0–36.0)
MCV: 86.5 fL (ref 78.0–100.0)
MCV: 86.5 fL (ref 78.0–100.0)
Platelets: 253 10*3/uL (ref 150–400)
Platelets: 224 10*3/uL (ref 150–400)
RBC: 4.07 MIL/uL (ref 3.87–5.11)
RBC: 3.85 MIL/uL — ABNORMAL LOW (ref 3.87–5.11)
RDW: 15 % (ref 11.5–15.5)
RDW: 15 % (ref 11.5–15.5)
WBC: 8.5 10*3/uL (ref 4.0–10.5)
WBC: 9.4 10*3/uL (ref 4.0–10.5)

## 2017-12-10 LAB — RAPID URINE DRUG SCREEN, HOSP PERFORMED
AMPHETAMINES: NOT DETECTED
BARBITURATES: NOT DETECTED
BENZODIAZEPINES: NOT DETECTED
COCAINE: NOT DETECTED
Opiates: NOT DETECTED
Tetrahydrocannabinol: NOT DETECTED

## 2017-12-10 LAB — PROTEIN / CREATININE RATIO, URINE
Creatinine, Urine: 28 mg/dL
Protein Creatinine Ratio: 0.25 mg/mg{creat} — ABNORMAL HIGH (ref 0.00–0.15)
Total Protein, Urine: 7 mg/dL

## 2017-12-10 LAB — COMPREHENSIVE METABOLIC PANEL
ALT: 12 U/L — AB (ref 14–54)
AST: 17 U/L (ref 15–41)
Albumin: 3.1 g/dL — ABNORMAL LOW (ref 3.5–5.0)
Alkaline Phosphatase: 126 U/L (ref 38–126)
Anion gap: 10 (ref 5–15)
BUN: 6 mg/dL (ref 6–20)
CALCIUM: 8.1 mg/dL — AB (ref 8.9–10.3)
CHLORIDE: 102 mmol/L (ref 101–111)
CO2: 20 mmol/L — ABNORMAL LOW (ref 22–32)
CREATININE: 0.5 mg/dL (ref 0.44–1.00)
GFR calc Af Amer: >60 mL/min (ref >60)
Glucose, Bld: 97 mg/dL (ref 65–99)
POTASSIUM: 3.8 mmol/L (ref 3.5–5.1)
Sodium: 132 mmol/L — ABNORMAL LOW (ref 135–145)
TOTAL PROTEIN: 7.4 g/dL (ref 6.5–8.1)
Total Bilirubin: 0.3 mg/dL (ref 0.3–1.2)

## 2017-12-10 LAB — TYPE AND SCREEN
ABO/RH(D): B POS
Antibody Screen: NEGATIVE

## 2017-12-10 LAB — ABO/RH: ABO/RH(D): B POS

## 2017-12-10 LAB — MAGNESIUM: Magnesium: 3.9 mg/dL — ABNORMAL HIGH (ref 1.7–2.4)

## 2017-12-10 MED ORDER — ONDANSETRON HCL 4 MG/2ML IJ SOLN
4.0000 mg | Freq: Four times a day (QID) | INTRAMUSCULAR | Status: DC | PRN
  Administered 2017-12-11: 4 mg via INTRAVENOUS
  Filled 2017-12-10: qty 2

## 2017-12-10 MED ORDER — OXYTOCIN 40 UNITS IN LACTATED RINGERS INFUSION - SIMPLE MED: 2.5000 [IU]/h | INTRAVENOUS | Status: DC

## 2017-12-10 MED ORDER — FENTANYL CITRATE (PF) 100 MCG/2ML IJ SOLN: 50.0000 ug | INTRAMUSCULAR | Status: DC | PRN

## 2017-12-10 MED ORDER — LACTATED RINGERS IV SOLN
500.0000 mL | INTRAVENOUS | Status: DC | PRN
  Administered 2017-12-11 (×2): 500 mL via INTRAVENOUS

## 2017-12-10 MED ORDER — LIDOCAINE HCL (PF) 1 % IJ SOLN: 30.0000 mL | INTRAMUSCULAR | Status: DC | PRN

## 2017-12-10 MED ORDER — PENICILLIN G POT IN DEXTROSE 60000 UNIT/ML IV SOLN
3.0000 10*6.[IU] | INTRAVENOUS | Status: DC
  Administered 2017-12-10 – 2017-12-11 (×5): 3 10*6.[IU] via INTRAVENOUS
  Filled 2017-12-10 (×8): qty 50

## 2017-12-10 MED ORDER — FLEET ENEMA 7-19 GM/118ML RE ENEM: 1.0000 | ENEMA | Freq: Every day | RECTAL | Status: DC | PRN

## 2017-12-10 MED ORDER — SOD CITRATE-CITRIC ACID 500-334 MG/5ML PO SOLN
30.0000 mL | ORAL | Status: DC | PRN
  Administered 2017-12-11: 30 mL via ORAL
  Filled 2017-12-10: qty 15

## 2017-12-10 MED ORDER — OXYCODONE-ACETAMINOPHEN 5-325 MG PO TABS: 1.0000 | ORAL_TABLET | ORAL | Status: DC | PRN

## 2017-12-10 MED ORDER — SODIUM CHLORIDE 0.9 % IV SOLN
5.0000 10*6.[IU] | Freq: Once | INTRAVENOUS | Status: AC
  Administered 2017-12-10: 5 10*6.[IU] via INTRAVENOUS
  Filled 2017-12-10: qty 5

## 2017-12-10 MED ORDER — MAGNESIUM SULFATE BOLUS VIA INFUSION
4.0000 g | Freq: Once | INTRAVENOUS | Status: AC
  Administered 2017-12-10: 4 g via INTRAVENOUS
  Filled 2017-12-10: qty 500

## 2017-12-10 MED ORDER — ZOLPIDEM TARTRATE 5 MG PO TABS: 5.0000 mg | ORAL_TABLET | Freq: Every evening | ORAL | Status: DC | PRN

## 2017-12-10 MED ORDER — OXYTOCIN 40 UNITS IN LACTATED RINGERS INFUSION - SIMPLE MED
1.0000 m[IU]/min | INTRAVENOUS | Status: DC
  Filled 2017-12-10: qty 1000

## 2017-12-10 MED ORDER — LABETALOL HCL 5 MG/ML IV SOLN
20.0000 mg | INTRAVENOUS | Status: AC | PRN
  Administered 2017-12-10 (×2): 20 mg via INTRAVENOUS
  Filled 2017-12-10 (×2): qty 4

## 2017-12-10 MED ORDER — ACETAMINOPHEN 325 MG PO TABS
650.0000 mg | ORAL_TABLET | ORAL | Status: DC | PRN
  Administered 2017-12-10 – 2017-12-11 (×3): 650 mg via ORAL
  Filled 2017-12-10 (×3): qty 2

## 2017-12-10 MED ORDER — HYDROXYZINE HCL 50 MG PO TABS: 50.0000 mg | ORAL_TABLET | Freq: Four times a day (QID) | ORAL | Status: DC | PRN

## 2017-12-10 MED ORDER — HYDRALAZINE HCL 20 MG/ML IJ SOLN: 5.0000 mg | INTRAMUSCULAR | Status: DC | PRN

## 2017-12-10 MED ORDER — OXYCODONE-ACETAMINOPHEN 5-325 MG PO TABS: 2.0000 | ORAL_TABLET | ORAL | Status: DC | PRN

## 2017-12-10 MED ORDER — TERBUTALINE SULFATE 1 MG/ML IJ SOLN: 0.2500 mg | Freq: Once | INTRAMUSCULAR | Status: DC | PRN

## 2017-12-10 MED ORDER — MAGNESIUM SULFATE 40 G IN LACTATED RINGERS - SIMPLE
2.0000 g/h | INTRAVENOUS | Status: DC
  Administered 2017-12-11: 2 g/h via INTRAVENOUS
  Filled 2017-12-10: qty 500
  Filled 2017-12-10: qty 40

## 2017-12-10 MED ORDER — LACTATED RINGERS IV SOLN
INTRAVENOUS | Status: DC
Start: 2017-12-10 — End: 2017-12-11
  Administered 2017-12-10 – 2017-12-11 (×2): via INTRAVENOUS

## 2017-12-10 MED ORDER — OXYTOCIN BOLUS FROM INFUSION
500.0000 mL | Freq: Once | INTRAVENOUS | Status: AC
  Administered 2017-12-11: 500 mL via INTRAVENOUS

## 2017-12-10 MED ORDER — MISOPROSTOL 25 MCG QUARTER TABLET
25.0000 ug | ORAL_TABLET | ORAL | Status: DC | PRN
  Administered 2017-12-10 (×2): 25 ug via VAGINAL
  Filled 2017-12-10 (×2): qty 1

## 2017-12-10 NOTE — Progress Notes (Signed)
   PRENATAL VISIT NOTE  Subjective:  Susan Walton is a 37 y.o. [id number] at [redacted]w[redacted]d being seen today for ongoing prenatal care.  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  Patient reports mild headache this morning. Also reports "anxiety attack" this morning that lasted for 10 minutes & resulted in her being "very sleepy" for the rest of the afternoon. Denies visual disturbance or epigastric pain. has not been taking her labetalol as prescribed d/t concerns to the baby. .  Contractions: Irregular. Vag. Bleeding: None.  Movement: Present. Denies leaking of fluid.   The following portions of the patient's history were reviewed and updated as appropriate: allergies, current medications, past family history, past medical history, past social history, past surgical history and problem list. Problem list updated.  Objective:   Vitals:   12/10/17 1540 12/10/17 1552  BP: (!) 166/91 (!) 169/103  Pulse: 79 72  Weight: 198 lb 6.4 oz (90 kg)     Fetal Status: Fetal Heart Rate (bpm): 155   Movement: Present     General:  Alert, oriented and cooperative. Patient is in no acute distress.  Skin: Skin is warm and dry. No rash noted.   Cardiovascular: Normal heart rate noted  Respiratory: Normal respiratory effort, no problems with respiration noted  Abdomen: Soft, gravid, appropriate for gestational age.  Pain/Pressure: Present     Pelvic: Cervical exam deferred        Extremities: Normal range of motion.  Edema: Trace  Mental Status:  Normal mood and affect. Normal behavior. Normal judgment  and thought content.   Assessment and Plan:  Pregnancy: [id number] at [redacted]w[redacted]d  1. Chronic hypertension affecting pregnancy -severe range BPs C/w Dr. Harolyn Rutherford-- nurse to escort pt upstairs for direct admission to birthing suites & IOL  2. Advanced maternal age in multigravida, second trimester   Term labor symptoms and general obstetric precautions including but not limited to vaginal bleeding, contractions, leaking of fluid and fetal movement were reviewed in detail with the patient. Please refer to After Visit Summary for other counseling recommendations.  No follow-ups on file.   Jorje Guild, NP

## 2017-12-10 NOTE — H&P (Addendum)
Obstetric History and Physical  Susan Walton is a 37 y.o. [id number] with IUP at [redacted]w[redacted]d presenting for induction of labor for [facility] with superimposed severe preeclampsia.  Followed at [facility], had a severe range BP yesterday and negative evaluation in MAU. Had been prescribed Labetalol but did not take it due to concerns about what she read bout its effect on baby's breathing.   Patient states she has been having no contractions, no vaginal bleeding, intact membranes, with active fetal movement.  Denies any headches, visual changes. RUQ/epigastric pain.   Prenatal Course Source of Care: [facility]   Pregnancy complications or risks: Patient Active Problem List   Diagnosis Date Noted  . Group B Streptococcus carrier, +RV culture, currently pregnant 12/10/2017  . Gonorrhea affecting pregnancy in first trimester 12/10/2017  . 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 Pregnancy, supervision, high-risk 07/27/2017  . Chronic hypertension with superimposed severe preeclampsia 07/27/2017  . 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 Thyroid dysfunction, antepartum 07/27/2017  . History of trichomoniasis 07/27/2017  . Unwanted fertility 07/27/2017  . Advanced maternal age in multigravida, second trimester   . Adjustment disorder with depressed mood 06/05/2017    Clinic [facility] Prenatal Labs  Dating 1st trimester u/s Blood type: B/Positive/-- (10/01 0000)   Genetic Screen NIPS: Low risk female Antibody:Negative (10/01 0000)  Anatomic [place]  10/28/17 Rubella: Immune (10/01 0000)  GTT Early:  71             Third trimester: 79/101/83 RPR: Non Reactive (08/09 1249)   Flu vaccine declined HBsAg: Negative (10/01 0000)   TDaP vaccine  declined HIV: NON REACTIVE (09/21 0722)  Baby Food Breast                                              ALP:[id number]  Contraception BTL, papers signed 08/2017 Pap:  Normal 06/2017  Circumcision N/A   Pediatrician [facility] GC: Positive  first trimester, then other tests negative  Support Person Bernita Buffy (mother) CF: neg  Prenatal Classes No Hgb electrophoresis: neg    Past Medical History:  Diagnosis Date  . Anemia   . Fibroid   . Hypertension   . Retinal detachment   . Thyroid disease    Hypothyroidism    Past Surgical History:  Procedure Laterality Date  . DILATION AND CURETTAGE OF UTERUS    . EYE SURGERY    . NO PAST SURGERIES      OB History  Gravida Para Term Preterm AB Living  8 4 4  0 3 4  SAB TAB Ectopic Multiple Live Births  2 1 0   4    # Outcome Date GA Lbr Len/2nd Weight Sex Delivery Anes PTL Lv  8 Current           7 Term 08/19/14 [redacted]w[redacted]d  7 lb (3.175 kg) F Vag-Spont EPI N LIV     Complications: Placenta previa  6 SAB 2012          5 TAB 2010          4 Term 10/20/05 [redacted]w[redacted]d  6 lb (2.722 kg) F Vag-Spont EPI N LIV  3 Term 04/15/02 [redacted]w[redacted]d  7 lb (3.175 kg) F Vag-Spont EPI N LIV  2 Term 07/31/00 [redacted]w[redacted]d  7 lb (3.175 kg) F Vag-Spont EPI N LIV  1 SAB  Social History   Socioeconomic History  . Marital status: Single    Spouse name: Not on file  . Number of children: Not on file  . Years of education: Not on file  . Highest education level: Not on file  Occupational History  . Not on file  Social Needs  . Financial resource strain: Not on file  . Food insecurity:    Worry: Not on file    Inability: Not on file  . Transportation needs:    Medical: Not on file    Non-medical: Not on file  Tobacco Use  . Smoking status: Former Smoker    Packs/day: 0.50    Types: Cigarettes  . Smokeless tobacco: Never Used  Substance and Sexual Activity  . Alcohol use: Not Currently    Comment: quit mth after +UPT  . Drug use: No    Types: Marijuana, Cocaine    Comment: quit 1 mth after +UPT  . Sexual activity: Yes  Lifestyle  . Physical activity:    Days per week: Not on file    Minutes per session: Not on file  . Stress: Not on file  Relationships  . Social connections:    Talks  on phone: Not on file    Gets together: Not on file    Attends religious service: Not on file    Active member of club or organization: Not on file    Attends meetings of clubs or organizations: Not on file    Relationship status: Not on file  Other Topics Concern  . Not on file  Social History Narrative   ** Merged History Encounter **        No family history on file.  Medications Prior to Admission  Medication Sig Dispense Refill Last Dose  . acetaminophen (TYLENOL) 325 MG tablet Take 650 mg by mouth every 6 (six) hours as needed for moderate pain or headache.   Taking  . aspirin 81 MG tablet Take 1 tablet (81 mg total) by mouth daily. (Patient not taking: Reported on 12/02/2017) 30 tablet 1 Not Taking  . diphenhydrAMINE (BENADRYL) 25 MG tablet Take 25 mg by mouth every 6 (six) hours as needed for sleep.   Taking  . labetalol (NORMODYNE) 200 MG tablet Take 1 tablet (200 mg total) by mouth 2 (two) times daily. (Patient not taking: Reported on 12/02/2017) 60 tablet 1 Not Taking  . Prenatal Vit-Fe Fumarate-FA (NAT-RUL PRENATAL VITAMINS) 28-0.8 MG TABS Take 1 tablet by mouth daily.   Taking    No Known Allergies  Review of Systems: Negative except for what is mentioned in HPI.  Physical Exam: BP (!) 141/85   Pulse 88   Temp (!) 97.4 F (36.3 C) (Oral)   Resp 16   Ht 5\' 8"  (1.727 m)   Wt 198 lb 6.4 oz (90 kg)   LMP 03/15/2017   BMI 30.17 kg/m  CONSTITUTIONAL: Well-developed, well-nourished female in no acute distress.  HENT:  Normocephalic, atraumatic, External right and left ear normal. Oropharynx is clear and moist EYES: Conjunctivae and EOM are normal. Pupils are equal, round, and reactive to light. No scleral icterus.  NECK: Normal range of motion, supple, no masses SKIN: Skin is warm and dry. No rash noted. Not diaphoretic. No erythema. No pallor. NEUROLOGIC: Alert and oriented to person, place, and time. Normal reflexes, muscle tone coordination. No cranial nerve deficit  noted. PSYCHIATRIC: Normal mood and affect. Normal behavior. Normal judgment and thought content. CARDIOVASCULAR: Normal heart rate noted,  regular rhythm RESPIRATORY: Effort and breath sounds normal, no problems with respiration noted ABDOMEN: Soft, nontender, nondistended, gravid. MUSCULOSKELETAL: Normal range of motion. No edema and no tenderness. 2+ distal pulses.  Cervical Exam: Dilatation 1cm   Effacement 50%   Station -2   Presentation: cephalic FHT:  Baseline rate 145 bpm   Variability moderate  Accelerations present   Decelerations none Contractions: none   Pertinent Labs/Studies:   Results for orders placed or performed during the hospital encounter of 12/10/17 (from the past 24 hour(s))  CBC     Status: Abnormal   Collection Time: 12/10/17  4:34 PM  Result Value Ref Range   WBC 8.5 4.0 - 10.5 K/uL   RBC 4.07 3.87 - 5.11 MIL/uL   Hemoglobin 11.8 (L) 12.0 - 15.0 g/dL   HCT 35.2 (L) 36.0 - 46.0 %   MCV 86.5 78.0 - 100.0 fL   MCH 29.0 26.0 - 34.0 pg   MCHC 33.5 30.0 - 36.0 g/dL   RDW 15.0 11.5 - 15.5 %   Platelets 253 150 - 400 K/uL  Comprehensive metabolic panel     Status: Abnormal   Collection Time: 12/10/17  5:22 PM  Result Value Ref Range   Sodium 133 (L) 135 - 145 mmol/L   Potassium 4.2 3.5 - 5.1 mmol/L   Chloride 106 101 - 111 mmol/L   CO2 21 (L) 22 - 32 mmol/L   Glucose, Bld 82 65 - 99 mg/dL   BUN 5 (L) 6 - 20 mg/dL   Creatinine, Ser 0.53 0.44 - 1.00 mg/dL   Calcium 8.8 (L) 8.9 - 10.3 mg/dL   Total Protein 7.1 6.5 - 8.1 g/dL   Albumin 3.2 (L) 3.5 - 5.0 g/dL   AST 17 15 - 41 U/L   ALT 11 (L) 14 - 54 U/L   Alkaline Phosphatase 130 (H) 38 - 126 U/L   Total Bilirubin 0.3 0.3 - 1.2 mg/dL   GFR calc non Af Amer >60 >60 mL/min   GFR calc Af Amer >60 >60 mL/min   Anion gap 6 5 - 15    [place] Mfm Fetal Bpp Wo Non Stress  Result Date: 12/09/2017 ----------------------------------------------------------------------  OBSTETRICS REPORT                      (Signed  Final 12/09/2017 02:16 pm) ---------------------------------------------------------------------- Patient Info  ID #:       [id number]                          D.O.B.:  07-16-1981 (36 yrs)  Name:       Susan Walton                    Visit Date: 12/09/2017 01:44 pm ---------------------------------------------------------------------- Performed By  Performed By:     Berlinda Last          Ref. Address:     [place]                                                             [place]  [place]                                                             [id number]  Attending:        Griffin Dakin MD         Location:         [facility]  Referred By:      Jolaine Click NP ---------------------------------------------------------------------- Orders   #  Description                                 Code   1  [place] MFM FETAL BPP WO NON STRESS              76819.01  ----------------------------------------------------------------------   #  Ordered By               Order #        Accession #    Episode #   1  Noni Saupe           [id number]      [id number]     [id number]  ---------------------------------------------------------------------- Indications   [redacted] weeks gestation of pregnancy                Z3A.37   Non-reactive NST, FHR decelerations            O28.9   Substance abuse affecting pregnancy,           O99.320 F19.10   antepartum; alcohol   Hyperthyroid                                   O99.280 E05.90   Advanced maternal age multigravida 33+,        O28.523   third trimester (37yrs old, low risk NIPS)   Hypertension - Chronic/Pre-existing            O10.019   Late to prenatal care, third trimester         O09.33  ---------------------------------------------------------------------- OB History  Blood Type:            Height:  5'8"   Weight (lb):  170       BMI:  25.85  Gravidity:    8         Term:   4         Prem:   0        SAB:   2  TOP:          1       Ectopic:  0        Living: 4 ---------------------------------------------------------------------- Fetal Evaluation  Num Of Fetuses:     1  Fetal Heart         154  Rate(bpm):  Cardiac Activity:   Observed  Presentation:       Cephalic  Amniotic Fluid  AFI FV:      Subjectively within normal limits  AFI Sum(cm)     %Tile       Largest Pocket(cm)  16.05  62          5.43  RUQ(cm)       RLQ(cm)       LUQ(cm)        LLQ(cm)  4.77          3.32          5.43           2.53  Comment:    8/8 BPP in 5 minutes. ---------------------------------------------------------------------- Biophysical Evaluation  Amniotic F.V:   Within normal limits       F. Tone:        Observed  F. Movement:    Observed                   Score:          8/8  F. Breathing:   Observed ---------------------------------------------------------------------- Gestational Age  LMP:           42w 5d        Date:  02/13/17                 EDD:   11/20/17  Best:          37w 5d     Det. By:  Previous Ultrasound      EDD:   12/25/17                                      (05/12/17) ---------------------------------------------------------------------- Impression  Intrauterine pregnancy at 37+5 weeks with [facility] and thyroid  disease, with FHR decelerations in the MAU; BPP requested  Normal amniotic fluid  BPP 8/8 ---------------------------------------------------------------------- Recommendations  Continue clinical evaluation and management. ----------------------------------------------------------------------                 Griffin Dakin, MD Electronically Signed Final Report   12/09/2017 02:16 pm ----------------------------------------------------------------------  [place] Mfm Fetal Bpp Wo Non Stress  Result Date: 12/02/2017 ----------------------------------------------------------------------  OBSTETRICS REPORT                      (Signed Final 12/02/2017 03:14 pm)  ---------------------------------------------------------------------- Patient Info  ID #:       [id number]                          D.O.B.:  Aug 14, 1981 (36 yrs)  Name:       Susan Walton                    Visit Date: 12/02/2017 02:34 pm ---------------------------------------------------------------------- Performed By  Performed By:     Dorena Dew     Ref. Address:     [place]                                                             [place]  [place]                                                             [address]  Attending:        Griffin Dakin MD         Location:         [facility]  Referred By:      Jolaine Click NP ---------------------------------------------------------------------- Orders   #  Description                                 Code   1  [place] MFM FETAL BPP WO NON STRESS              76819.01  ----------------------------------------------------------------------   #  Ordered By               Order #        Accession #    Episode #   1  JEFFREY Margurite Auerbach           [id number]      [id number]     [id number]  ---------------------------------------------------------------------- Indications   [redacted] weeks gestation of pregnancy                Z3A.36   Substance abuse affecting pregnancy,           O99.320 F19.10   antepartum; alcohol   Hyperthyroid                                   O99.280 E05.90   Advanced maternal age multigravida 20+,        O44.523   third trimester (37yrs old, low risk NIPS)   Hypertension - Chronic/Pre-existing            O10.019   Late to prenatal care, third trimester         O09.33  ---------------------------------------------------------------------- OB History  Blood Type:            Height:  5'8"   Weight (lb):  170       BMI:  25.85  Gravidity:    8         Term:   4        Prem:   0        SAB:   2  TOP:          1       Ectopic:  0        Living: 4  ---------------------------------------------------------------------- Fetal Evaluation  Num Of Fetuses:     1  Fetal Heart         143  Rate(bpm):  Cardiac Activity:   Observed  Presentation:       Cephalic  Placenta:           Posterior, above cervical os  Amniotic Fluid  AFI FV:      Subjectively within normal limits  AFI Sum(cm)     %Tile       Largest Pocket(cm)  12.94  45          3.77  RUQ(cm)       RLQ(cm)       LUQ(cm)        LLQ(cm)  3.4           3.77          2.82           2.95 ---------------------------------------------------------------------- Biophysical Evaluation  Amniotic F.V:   Pocket => 2 cm two         F. Tone:        Observed                  planes  F. Movement:    Observed                   Score:          8/8  F. Breathing:   Observed ---------------------------------------------------------------------- Gestational Age  LMP:           70w 5d        Date:  02/13/17                 EDD:   11/20/17  Best:          36w 5d     Det. By:  Previous Ultrasound      EDD:   12/25/17                                      (05/12/17) ---------------------------------------------------------------------- Impression  Intrauterine pregnancy at 36+5 weeks with hypertension,  AMA and thyroid disease  Normal amniotic fluid  BPP 8/8 ---------------------------------------------------------------------- Recommendations  Continue weekly BPPs ----------------------------------------------------------------------                 Griffin Dakin, MD Electronically Signed Final Report   12/02/2017 03:14 pm ----------------------------------------------------------------------  [place] Mfm Fetal Bpp Wo Non Stress  Result Date: 11/25/2017 ----------------------------------------------------------------------  OBSTETRICS REPORT                      (Signed Final 11/25/2017 03:18 pm) ---------------------------------------------------------------------- Patient Info  ID #:       [id number]                           D.O.B.:  Jan 15, 1981 (36 yrs)  Name:       Susan Walton                    Visit Date: 11/25/2017 02:36 pm ---------------------------------------------------------------------- Performed By  Performed By:     Dorena Dew     Ref. Address:     [place], [address]                                                             [place]  [place]                                                             [id number]  Attending:        Jolyn Lent MD      Location:         [facility]  Referred By:      Jolaine Click NP ---------------------------------------------------------------------- Orders   #  Description                                 Code   1  [place] MFM OB FOLLOW UP                         [phone]   2  [place] MFM FETAL BPP WO NON STRESS              76819.01  ----------------------------------------------------------------------   #  Ordered By               Order #        Accession #    Episode #   1  JEFFREY Margurite Auerbach           [id number]      [id number]     [id number]   2  JEFFREY DENNEY           [id number]      [id number]     [id number]  ---------------------------------------------------------------------- Indications   [redacted] weeks gestation of pregnancy                Z3A.35   Substance abuse affecting pregnancy,           O99.320 F19.10   antepartum; alcohol   Hyperthyroid                                   O99.280 E05.90   Advanced maternal age multigravida 15+,        O7.523   third trimester (37yrs old, low risk NIPS)   Hypertension - Chronic/Pre-existing            O10.019   Late to prenatal care, third trimester         O09.33  ---------------------------------------------------------------------- OB History  Blood Type:            Height:  5'8"   Weight (lb):  170       BMI:  25.85  Gravidity:    8         Term:   4        Prem:   0        SAB:   2  TOP:          1       Ectopic:  0        Living: 4  ---------------------------------------------------------------------- Fetal Evaluation  Num Of Fetuses:     1  Cardiac Activity:   Observed  Presentation:       Cephalic  Placenta:  Posterior, above cervical os  Amniotic Fluid  AFI FV:      Subjectively within normal limits  AFI Sum(cm)     %Tile       Largest Pocket(cm)  18.8            70          7.76  RUQ(cm)       RLQ(cm)       LUQ(cm)        LLQ(cm)  3.44          7.76          4.97           2.63 ---------------------------------------------------------------------- Biophysical Evaluation  Amniotic F.V:   Pocket => 2 cm two         F. Tone:        Observed                  planes  F. Movement:    Observed                   Score:          8/8  F. Breathing:   Observed ---------------------------------------------------------------------- Biometry  BPD:      85.5  mm     G. Age:  34w 3d         22  %    CI:        73.97   %    70 - 86                                                          FL/HC:      20.8   %    20.1 - 22.1  HC:      315.7  mm     G. Age:  35w 3d         16  %    HC/AC:      0.99        0.93 - 1.11  AC:      318.6  mm     G. Age:  35w 5d         61  %    FL/BPD:     76.7   %    71 - 87  FL:       65.6  mm     G. Age:  33w 6d          8  %    FL/AC:      20.6   %    20 - 24  Est. FW:    2590  gm    5 lb 11 oz      50  % ---------------------------------------------------------------------- Gestational Age  LMP:           82w 5d        Date:  02/13/17                 EDD:   11/20/17  U/S Today:     34w 6d  EDD:   12/31/17  Best:          35w 5d     Det. By:  Previous Ultrasound      EDD:   12/25/17                                      (05/12/17) ---------------------------------------------------------------------- Anatomy  Cranium:               Appears normal         Aortic Arch:            Previously seen  Cavum:                 Previously seen        Ductal Arch:            Not well  visualized  Ventricles:            Appears normal         Diaphragm:              Appears normal  Choroid Plexus:        Previously seen        Stomach:                Appears normal, left                                                                        sided  Cerebellum:            Previously seen        Abdomen:                Appears normal  Posterior Fossa:       Previously seen        Abdominal Wall:         Previously seen  Nuchal Fold:           Not applicable (>61    Cord Vessels:           Previously seen                         wks GA)  Face:                  Orbits and profile     Kidneys:                Appear normal                         previously seen  Lips:                  Previously seen        Bladder:                Appears normal  Thoracic:              Appears normal         Spine:                  Limited views  previously seen  Heart:                 Appears normal         Upper Extremities:      Previously seen                         (4CH, axis, and situs  RVOT:                  Previously seen        Lower Extremities:      Previously seen  LVOT:                  Previously seen  Other:  Female gender previously seen. Heels previously visualized.          Technically difficult due to advanced gestational age. ---------------------------------------------------------------------- Cervix Uterus Adnexa  Cervix  Not visualized (advanced GA >29wks)  Left Ovary  Previously seen.  Right Ovary  Previously seen ---------------------------------------------------------------------- Impression  Single living intrauterine pregnancy at 35 weeks 5 days.  Appropriate interval fetal growth (50%).  Normal amniotic fluid volume.  Normal interval fetal anatomy.  BPP 8/8. ---------------------------------------------------------------------- Recommendations  Continue antenatal fetal testing.  ----------------------------------------------------------------------                 Jolyn Lent, MD Electronically Signed Final Report   11/25/2017 03:18 pm ----------------------------------------------------------------------  [place] Mfm Fetal Bpp Wo Non Stress  Result Date: 11/11/2017 ----------------------------------------------------------------------  OBSTETRICS REPORT                      (Signed Final 11/11/2017 06:04 pm) ---------------------------------------------------------------------- Patient Info  ID #:       [id number]                          D.O.B.:  [birth year] (36 yrs)  Name:       Susan Walton                    Visit Date: 11/11/2017 02:27 pm ---------------------------------------------------------------------- Performed By  Performed By:     Rodrigo Ran BS      Ref. Address:     [address]                                                             [place]                                                             [phone]  Attending:  Wende Mott MD     Location:         [facility]  Referred By:      Jolaine Click NP ---------------------------------------------------------------------- Orders   #  Description                                 Code   1  [place] MFM FETAL BPP WO NON STRESS              76819.01  ----------------------------------------------------------------------   #  Ordered By               Order #        Accession #    Episode #   1  JEFFREY Margurite Auerbach           [id number]      [id number]     [id number]  ---------------------------------------------------------------------- Indications   [redacted] weeks gestation of pregnancy                Z3A.33   Substance abuse affecting pregnancy,           O99.320 F19.10   antepartum; alcohol   Hyperthyroid                                   O99.280 E05.90   Advanced maternal age multigravida 65+,         O60.523   third trimester (37yrs old, low risk NIPS)   Hypertension - Chronic/Pre-existing            O10.019   Late to prenatal care, third trimester         O09.33  ---------------------------------------------------------------------- OB History  Blood Type:            Height:  5'8"   Weight (lb):  170       BMI:  25.85  Gravidity:    8         Term:   4        Prem:   0        SAB:   2  TOP:          1       Ectopic:  0        Living: 4 ---------------------------------------------------------------------- Fetal Evaluation  Num Of Fetuses:     1  Fetal Heart         167  Rate(bpm):  Cardiac Activity:   Observed  Presentation:       Cephalic  Placenta:           Posterior, above cervical os  Amniotic Fluid  AFI FV:      Subjectively within normal limits  AFI Sum(cm)     %Tile       Largest Pocket(cm)  18.64           69          7.05  RUQ(cm)       RLQ(cm)       LUQ(cm)        LLQ(cm)  7.05          4.88          3.39  3.32 ---------------------------------------------------------------------- Biophysical Evaluation  Amniotic F.V:   Within normal limits       F. Tone:        Observed  F. Movement:    Observed                   Score:          8/8  F. Breathing:   Observed ---------------------------------------------------------------------- Gestational Age  LMP:           38w 5d        Date:  02/13/17                 EDD:   11/20/17  Best:          33w 5d     Det. By:  Previous Ultrasound      EDD:   12/25/17                                      (05/12/17) ---------------------------------------------------------------------- Anatomy  Cranium:               Appears normal         Ductal Arch:            Appears normal  Cerebellum:            Appears normal         Stomach:                Appears normal, left                                                                        sided  Posterior Fossa:       Appears normal         Kidneys:                Appear normal  Face:                  Profile  appears        Bladder:                Appears normal                         normal  Aortic Arch:           Appears normal         Spine:                  Appears normal ---------------------------------------------------------------------- Impression  Indication: 37 yr old [id number] at [redacted]w[redacted]d with chronic  hypertension, subclinical hyperthyroidism, and history of  substance abuse for BPP and reevaluate fetal anatomy.  Findings:  1. Single intrauterine pregnancy with cardiac activity.  2. Posterior placenta without evidence of previa.  3. Normal amniotic fluid index.  4. The limited anatomy survye is normal; any anatomy not  evaluated on today's exam was evaluated on the previous  exam.  5. Normal biophysical profile of 8/8.  6. Fetus is in cephalic presentation. ---------------------------------------------------------------------- Recommendations  1. Hypertension:  - recommend fetal growth every 4 weeks  - recommend  continue antenatal testing  - recommend close surveillance for the development of  signs/symptoms of preeclampsia- reviewed with patient  2. Subclinical hyperthyroidism:  - recommend follow TFTs  3. History of substance abuse:  - recommend counseled and serial drug screens  4. Fetal anatomic survey is complete.  5. Advanced maternal age:  - low risk cell free fetal DNA and MSAFP ----------------------------------------------------------------------                Wende Mott, MD Electronically Signed Final Report   11/11/2017 06:04 pm ----------------------------------------------------------------------  [place] Mfm Ob Follow Up  Result Date: 11/25/2017 ----------------------------------------------------------------------  OBSTETRICS REPORT                      (Signed Final 11/25/2017 03:18 pm) ---------------------------------------------------------------------- Patient Info  ID #:       [id number]                          D.O.B.:  1980-10-29 (36 yrs)  Name:       Susan Walton                     Visit Date: 11/25/2017 02:36 pm ---------------------------------------------------------------------- Performed By  Performed By:     Dorena Dew     Ref. Address:     [place], [place]                                                             [place]  Attending:        Jolyn Lent MD      Location:         [facility]  Referred By:      Jolaine Click NP ---------------------------------------------------------------------- Orders   #  Description                                 Code   1  [place] MFM OB FOLLOW UP                         [id number]   2  [place] MFM FETAL BPP WO NON STRESS              09326.71  ----------------------------------------------------------------------   #  Ordered By               Order #        Accession #    Episode #   Hardeman           [id number]      [id number]     [id number]   2  JEFFREY DENNEY           [id number]      [id number]     [id number]  ---------------------------------------------------------------------- Indications   [redacted] weeks gestation of pregnancy                Z3A.35   Substance abuse affecting pregnancy,           O99.320 F19.10   antepartum; alcohol   Hyperthyroid                                   O99.280 E05.90   Advanced maternal age multigravida 40+,        O70.523   third trimester (37yrs old, low risk NIPS)   Hypertension - Chronic/Pre-existing            O10.019   Late to prenatal care, third trimester         O09.33  ---------------------------------------------------------------------- OB History  Blood Type:            Height:  5'8"   Weight (lb):  170       BMI:  25.85  Gravidity:    8         Term:   4        Prem:   0        SAB:   2  TOP:          1       Ectopic:  0        Living: 4 ---------------------------------------------------------------------- Fetal  Evaluation  Num Of Fetuses:     1  Cardiac Activity:   Observed  Presentation:       Cephalic  Placenta:           Posterior, above cervical os  Amniotic Fluid  AFI FV:      Subjectively within normal limits  AFI Sum(cm)     %Tile       Largest Pocket(cm)  18.8            70          7.76  RUQ(cm)       RLQ(cm)       LUQ(cm)        LLQ(cm)  3.44          7.76          4.97           2.63 ---------------------------------------------------------------------- Biophysical Evaluation  Amniotic F.V:   Pocket => 2 cm two         F. Tone:        Observed                  planes  F. Movement:    Observed                   Score:          8/8  F. Breathing:   Observed ---------------------------------------------------------------------- Biometry  BPD:      85.5  mm  G. Age:  34w 3d         22  %    CI:        73.97   %    70 - 86                                                          FL/HC:      20.8   %    20.1 - 22.1  HC:      315.7  mm     G. Age:  35w 3d         16  %    HC/AC:      0.99        0.93 - 1.11  AC:      318.6  mm     G. Age:  35w 5d         61  %    FL/BPD:     76.7   %    71 - 87  FL:       65.6  mm     G. Age:  33w 6d          8  %    FL/AC:      20.6   %    20 - 24  Est. FW:    2590  gm    5 lb 11 oz      50  % ---------------------------------------------------------------------- Gestational Age  LMP:           70w 5d        Date:  02/13/17                 EDD:   11/20/17  U/S Today:     34w 6d                                        EDD:   12/31/17  Best:          35w 5d     Det. By:  Previous Ultrasound      EDD:   12/25/17                                      (05/12/17) ---------------------------------------------------------------------- Anatomy  Cranium:               Appears normal         Aortic Arch:            Previously seen  Cavum:                 Previously seen        Ductal Arch:            Not well visualized  Ventricles:            Appears normal         Diaphragm:               Appears normal  Choroid Plexus:        Previously seen        Stomach:  Appears normal, left                                                                        sided  Cerebellum:            Previously seen        Abdomen:                Appears normal  Posterior Fossa:       Previously seen        Abdominal Wall:         Previously seen  Nuchal Fold:           Not applicable (>19    Cord Vessels:           Previously seen                         wks GA)  Face:                  Orbits and profile     Kidneys:                Appear normal                         previously seen  Lips:                  Previously seen        Bladder:                Appears normal  Thoracic:              Appears normal         Spine:                  Limited views                                                                        previously seen  Heart:                 Appears normal         Upper Extremities:      Previously seen                         (4CH, axis, and situs  RVOT:                  Previously seen        Lower Extremities:      Previously seen  LVOT:                  Previously seen  Other:  Female gender previously seen. Heels previously visualized.          Technically difficult due to advanced gestational age. ---------------------------------------------------------------------- Cervix Uterus Adnexa  Cervix  Not visualized (advanced GA >29wks)  Left Ovary  Previously seen.  Right Ovary  Previously seen ---------------------------------------------------------------------- Impression  Single living intrauterine pregnancy at 35 weeks 5 days.  Appropriate interval fetal growth (50%).  Normal amniotic fluid volume.  Normal interval fetal anatomy.  BPP 8/8. ---------------------------------------------------------------------- Recommendations  Continue antenatal fetal testing. ----------------------------------------------------------------------                 Jolyn Lent, MD Electronically  Signed Final Report   11/25/2017 03:18 pm ----------------------------------------------------------------------   Assessment : Susan Walton is a 37 y.o. C1K4818 at [redacted]w[redacted]d being admitted for induction of labor due to [facility] with superimposed severe preeclampsia.  Plan: Labor: Induction as ordered as per protocol; will start with foley bulb and misoprostol.  Analgesia as needed. CHTN with superimposed severe preeclampsia: Magnesium sulfate to be started for eclampsia prophylaxis.  Labs ordered; will follow every 6 hours. Will monitor BP closely.  Hydralazine protocol ordered as needed. History of drug use:  Long history of EtOH, cocaine and marijuana abuse. Other children in DSS custody. Recent UDS on 11/24/17 was negative.  Only tested positive for THC in pregnancy. Will repeat UDS today, SW consulted. FWB: Reassuring fetal heart tracing.  GBS positive; PCN ordered Delivery plan: Hopeful for vaginal delivery   Verita Schneiders, MD, [place], [facility] for [employer], [place]

## 2017-12-10 NOTE — Progress Notes (Signed)
Susan Walton is a 37 y.o. G1W2993 at [redacted]w[redacted]d admitted for induction of labor due to [facility] with superimposed severe preeclampsia.  Subjective: Patient is comfortable.  Objective: BP (!) 158/89   Pulse 90   Temp (!) 97.4 F (36.3 C) (Oral)   Resp 16   Ht 5\' 8"  (1.727 m)   Wt 198 lb 6.4 oz (90 kg)   LMP 03/15/2017   BMI 30.17 kg/m  No intake/output data recorded. Total I/O In: 324.2 [I.V.:74.2; IV Piggyback:250] Out: 100 [Urine:100]  FHT:  FHR: 145 bpm, variability: moderate,  accelerations:  Present,  decelerations:  Absent UC:   none SVE:   Dilation: 1.5 Effacement (%): 50 Station: -2 Exam by:: Marylynne Keelin Foley bulb placed and balloon inflated with 60 ml of LR. Misoprostol 25 mcg per vagina also placed  Labs: Results for orders placed or performed during the hospital encounter of 12/10/17 (from the past 24 hour(s))  CBC     Status: Abnormal   Collection Time: 12/10/17  4:34 PM  Result Value Ref Range   WBC 8.5 4.0 - 10.5 K/uL   RBC 4.07 3.87 - 5.11 MIL/uL   Hemoglobin 11.8 (L) 12.0 - 15.0 g/dL   HCT 35.2 (L) 36.0 - 46.0 %   MCV 86.5 78.0 - 100.0 fL   MCH 29.0 26.0 - 34.0 pg   MCHC 33.5 30.0 - 36.0 g/dL   RDW 15.0 11.5 - 15.5 %   Platelets 253 150 - 400 K/uL  Comprehensive metabolic panel     Status: Abnormal   Collection Time: 12/10/17  5:22 PM  Result Value Ref Range   Sodium 133 (L) 135 - 145 mmol/L   Potassium 4.2 3.5 - 5.1 mmol/L   Chloride 106 101 - 111 mmol/L   CO2 21 (L) 22 - 32 mmol/L   Glucose, Bld 82 65 - 99 mg/dL   BUN 5 (L) 6 - 20 mg/dL   Creatinine, Ser 0.53 0.44 - 1.00 mg/dL   Calcium 8.8 (L) 8.9 - 10.3 mg/dL   Total Protein 7.1 6.5 - 8.1 g/dL   Albumin 3.2 (L) 3.5 - 5.0 g/dL   AST 17 15 - 41 U/L   ALT 11 (L) 14 - 54 U/L   Alkaline Phosphatase 130 (H) 38 - 126 U/L   Total Bilirubin 0.3 0.3 - 1.2 mg/dL   GFR calc non Af Amer >60 >60 mL/min   GFR calc Af Amer >60 >60 mL/min   Anion gap 6 5 - 15   Assessment / Plan: Induction of labor due to [facility]  with superimposed severe preeclampsia   Labor: Now started with foley bulb with misoprostol. Will continue as per protocol. CHTN with Severe Preeclampsia:  On magnesium sulfate. Continue q6h labs, monitor BP. On Hydralazine protocol.  Fetal Wellbeing:  Category I Pain Control:  Labor support without medications for now and epidural when desired I/D:  GBS positive, on PCN Anticipated MOD:  NSVD   Verita Schneiders, MD 12/10/2017, 5:56 PM

## 2017-12-10 NOTE — Progress Notes (Signed)
Labor Progress Note Susan Walton is a 37 y.o. [id number] at [redacted]w[redacted]d presented for IOL due to cHTN with superimposed severe pre-e  S: Patient is resting comfortably.   O:  BP (!) 157/92   Pulse 85   Temp 98.9 F (37.2 C) (Oral)   Resp 17   Ht 5\' 8"  (1.727 m)   Wt 90 kg (198 lb 6.4 oz)   LMP 03/15/2017   BMI 30.17 kg/m   KGS:UPJSRPRX rate 140, moderate variability, + acels, absent decels Toco: ctx every 3-4 min  CVE: Dilation: 1.5 Effacement (%): 50 Station: -2 Presentation: Vertex Exam by:: Anyanwu   A&P: 37 y.o. Y5O5929 [redacted]w[redacted]d who present for IOL due to cHTN with superimposed severe pre-e #Labor: FB was placed at 1754, currently still in place. Cytotec was given at 1755 for cervical ripening.  #Pain: per patient request  #FWB: Category I #GBS positive - PCN #cHTN with severe pre-eclampsia: on magnesium sulfate, continue to monitor. Patient is asymptomatic.     Marvis Repress, Medical Student 9:23 PM

## 2017-12-10 NOTE — Progress Notes (Signed)
States had panic attack this am- lasted 10 minutes- got back in bed til 1pm. States bp was up this am. States not taking labetolol - only took it for 2 days. States stopped it because read it affects baby's breathing.

## 2017-12-10 NOTE — Anesthesia Pain Management Evaluation Note (Signed)
  CRNA Pain Management Visit Note  Patient: Susan Walton, 37 y.o., female  "Hello I am a member of the anesthesia team at [facility]. We have an anesthesia team available at all times to provide care throughout the hospital, including epidural management and anesthesia for C-section. I don't know your plan for the delivery whether it a natural birth, water birth, IV sedation, nitrous supplementation, doula or epidural, but we want to meet your pain goals."   1.Was your pain managed to your expectations on prior hospitalizations?   Yes   2.What is your expectation for pain management during this hospitalization?     Epidural and IV pain meds  3.How can we help you reach that goal? Be available  Record the patient's initial score and the patient's pain goal.   Pain: 2  Pain Goal: 5 [facility] wants you to be able to say your pain was always managed very well.  [facility] 12/10/2017

## 2017-12-11 ENCOUNTER — Inpatient Hospital Stay (HOSPITAL_COMMUNITY): Payer: Medicaid Other | Admitting: Anesthesiology

## 2017-12-11 ENCOUNTER — Encounter (HOSPITAL_COMMUNITY): Payer: Self-pay | Admitting: Obstetrics

## 2017-12-11 ENCOUNTER — Encounter (HOSPITAL_COMMUNITY)
Admission: AD | Disposition: A | Discharge status: Home / Self Care | Payer: Self-pay | Source: Ambulatory Visit | Attending: Obstetrics and Gynecology

## 2017-12-11 DIAGNOSIS — O1092 Unspecified pre-existing hypertension complicating childbirth: Secondary | ICD-10-CM | POA: Diagnosis not present

## 2017-12-11 DIAGNOSIS — Z302 Encounter for sterilization: Secondary | ICD-10-CM

## 2017-12-11 DIAGNOSIS — O99824 Streptococcus B carrier state complicating childbirth: Secondary | ICD-10-CM

## 2017-12-11 DIAGNOSIS — Z3A37 37 weeks gestation of pregnancy: Secondary | ICD-10-CM

## 2017-12-11 HISTORY — PX: TUBAL LIGATION: SHX77

## 2017-12-11 LAB — CBC
HCT: 35.7 % — ABNORMAL LOW (ref 36.0–46.0)
HCT: 34.2 % — ABNORMAL LOW (ref 36.0–46.0)
HCT: 30.9 % — ABNORMAL LOW (ref 36.0–46.0)
HEMOGLOBIN: 11.3 g/dL — AB (ref 12.0–15.0)
Hemoglobin: 11.9 g/dL — ABNORMAL LOW (ref 12.0–15.0)
Hemoglobin: 10.1 g/dL — ABNORMAL LOW (ref 12.0–15.0)
MCH: 28.6 pg (ref 26.0–34.0)
MCH: 28.6 pg (ref 26.0–34.0)
MCH: 28.1 pg (ref 26.0–34.0)
MCHC: 33.3 g/dL (ref 30.0–36.0)
MCHC: 33 g/dL (ref 30.0–36.0)
MCHC: 32.7 g/dL (ref 30.0–36.0)
MCV: 85.8 fL (ref 78.0–100.0)
MCV: 86.6 fL (ref 78.0–100.0)
MCV: 86.1 fL (ref 78.0–100.0)
PLATELETS: 192 10*3/uL (ref 150–400)
Platelets: 227 10*3/uL (ref 150–400)
Platelets: 234 10*3/uL (ref 150–400)
RBC: 4.16 MIL/uL (ref 3.87–5.11)
RBC: 3.95 MIL/uL (ref 3.87–5.11)
RBC: 3.59 MIL/uL — AB (ref 3.87–5.11)
RDW: 15 % (ref 11.5–15.5)
RDW: 15.1 % (ref 11.5–15.5)
RDW: 15.1 % (ref 11.5–15.5)
WBC: 12.5 10*3/uL — AB (ref 4.0–10.5)
WBC: 12.7 10*3/uL — ABNORMAL HIGH (ref 4.0–10.5)
WBC: 11.3 10*3/uL — ABNORMAL HIGH (ref 4.0–10.5)

## 2017-12-11 LAB — COMPREHENSIVE METABOLIC PANEL
ALBUMIN: 3.1 g/dL — AB (ref 3.5–5.0)
ALBUMIN: 3 g/dL — AB (ref 3.5–5.0)
ALK PHOS: 128 U/L — AB (ref 38–126)
ALT: 12 U/L — ABNORMAL LOW (ref 14–54)
ALT: 9 U/L — AB (ref 14–54)
ALT: 10 U/L — ABNORMAL LOW (ref 14–54)
ANION GAP: 10 (ref 5–15)
ANION GAP: 8 (ref 5–15)
ANION GAP: 10 (ref 5–15)
AST: 18 U/L (ref 15–41)
AST: 21 U/L (ref 15–41)
AST: 17 U/L (ref 15–41)
Albumin: 2.6 g/dL — ABNORMAL LOW (ref 3.5–5.0)
Alkaline Phosphatase: 134 U/L — ABNORMAL HIGH (ref 38–126)
Alkaline Phosphatase: 111 U/L (ref 38–126)
BUN: <5 mg/dL — ABNORMAL LOW (ref 6–20)
CALCIUM: 7.6 mg/dL — AB (ref 8.9–10.3)
CHLORIDE: 104 mmol/L (ref 101–111)
CHLORIDE: 104 mmol/L (ref 101–111)
CO2: 18 mmol/L — ABNORMAL LOW (ref 22–32)
CO2: 19 mmol/L — ABNORMAL LOW (ref 22–32)
CO2: 18 mmol/L — AB (ref 22–32)
CREATININE: 0.51 mg/dL (ref 0.44–1.00)
Calcium: 7.9 mg/dL — ABNORMAL LOW (ref 8.9–10.3)
Calcium: 7.2 mg/dL — ABNORMAL LOW (ref 8.9–10.3)
Chloride: 106 mmol/L (ref 101–111)
Creatinine, Ser: 0.52 mg/dL (ref 0.44–1.00)
Creatinine, Ser: 0.46 mg/dL (ref 0.44–1.00)
GFR calc Af Amer: >60 mL/min (ref >60)
GFR calc Af Amer: >60 mL/min (ref >60)
GFR calc non Af Amer: >60 mL/min (ref >60)
GFR calc non Af Amer: >60 mL/min (ref >60)
GLUCOSE: 82 mg/dL (ref 65–99)
Glucose, Bld: 103 mg/dL — ABNORMAL HIGH (ref 65–99)
Glucose, Bld: 95 mg/dL (ref 65–99)
POTASSIUM: 3.5 mmol/L (ref 3.5–5.1)
POTASSIUM: 4 mmol/L (ref 3.5–5.1)
Potassium: 4.4 mmol/L (ref 3.5–5.1)
SODIUM: 132 mmol/L — AB (ref 135–145)
Sodium: 132 mmol/L — ABNORMAL LOW (ref 135–145)
Sodium: 133 mmol/L — ABNORMAL LOW (ref 135–145)
TOTAL PROTEIN: 7.2 g/dL (ref 6.5–8.1)
Total Bilirubin: 0.2 mg/dL — ABNORMAL LOW (ref 0.3–1.2)
Total Bilirubin: 0.2 mg/dL — ABNORMAL LOW (ref 0.3–1.2)
Total Protein: 7.5 g/dL (ref 6.5–8.1)
Total Protein: 6.3 g/dL — ABNORMAL LOW (ref 6.5–8.1)

## 2017-12-11 LAB — MAGNESIUM
MAGNESIUM: 4.6 mg/dL — AB (ref 1.7–2.4)
Magnesium: 4.9 mg/dL — ABNORMAL HIGH (ref 1.7–2.4)
Magnesium: 4.8 mg/dL — ABNORMAL HIGH (ref 1.7–2.4)

## 2017-12-11 LAB — RPR: RPR: NONREACTIVE

## 2017-12-11 SURGERY — LIGATION, FALLOPIAN TUBE, POSTPARTUM
Anesthesia: Choice

## 2017-12-11 MED ORDER — HYDROMORPHONE HCL 1 MG/ML IJ SOLN
1.0000 mg | INTRAMUSCULAR | Status: DC | PRN
  Administered 2017-12-11: 1 mg via INTRAVENOUS

## 2017-12-11 MED ORDER — PRENATAL MULTIVITAMIN CH
1.0000 | ORAL_TABLET | Freq: Every day | ORAL | Status: DC
  Administered 2017-12-12: 1 via ORAL
  Filled 2017-12-11: qty 1

## 2017-12-11 MED ORDER — MISOPROSTOL 200 MCG PO TABS
ORAL_TABLET | ORAL | Status: AC
  Administered 2017-12-11: 800 ug via RECTAL
  Filled 2017-12-11: qty 4

## 2017-12-11 MED ORDER — LIDOCAINE HCL (PF) 1 % IJ SOLN
INTRAMUSCULAR | Status: DC | PRN
  Administered 2017-12-11 (×2): 5 mL

## 2017-12-11 MED ORDER — SENNOSIDES-DOCUSATE SODIUM 8.6-50 MG PO TABS
2.0000 | ORAL_TABLET | ORAL | Status: DC
  Administered 2017-12-11 – 2017-12-12 (×2): 2 via ORAL
  Filled 2017-12-11 (×2): qty 2

## 2017-12-11 MED ORDER — EPHEDRINE 5 MG/ML INJ
10.0000 mg | INTRAVENOUS | Status: DC | PRN
  Filled 2017-12-11: qty 4

## 2017-12-11 MED ORDER — PHENYLEPHRINE 40 MCG/ML (10ML) SYRINGE FOR IV PUSH (FOR BLOOD PRESSURE SUPPORT): 80.0000 ug | PREFILLED_SYRINGE | INTRAVENOUS | Status: DC | PRN

## 2017-12-11 MED ORDER — LIDOCAINE-EPINEPHRINE (PF) 2 %-1:200000 IJ SOLN
INTRAMUSCULAR | Status: AC
  Filled 2017-12-11: qty 20

## 2017-12-11 MED ORDER — LABETALOL HCL 5 MG/ML IV SOLN
INTRAVENOUS | Status: AC
  Administered 2017-12-11: 20 mg
  Filled 2017-12-11: qty 4

## 2017-12-11 MED ORDER — BUPIVACAINE HCL (PF) 0.25 % IJ SOLN
INTRAMUSCULAR | Status: AC
  Filled 2017-12-11: qty 30

## 2017-12-11 MED ORDER — ACETAMINOPHEN 325 MG PO TABS: 650.0000 mg | ORAL_TABLET | ORAL | Status: DC | PRN

## 2017-12-11 MED ORDER — TERBUTALINE SULFATE 1 MG/ML IJ SOLN: 0.2500 mg | Freq: Once | INTRAMUSCULAR | Status: DC | PRN

## 2017-12-11 MED ORDER — ONDANSETRON HCL 4 MG PO TABS: 4.0000 mg | ORAL_TABLET | ORAL | Status: DC | PRN

## 2017-12-11 MED ORDER — DIPHENHYDRAMINE HCL 25 MG PO CAPS: 25.0000 mg | ORAL_CAPSULE | Freq: Four times a day (QID) | ORAL | Status: DC | PRN

## 2017-12-11 MED ORDER — MIDAZOLAM HCL 2 MG/2ML IJ SOLN
INTRAMUSCULAR | Status: DC | PRN
  Administered 2017-12-11: 2 mg via INTRAVENOUS

## 2017-12-11 MED ORDER — BENZOCAINE-MENTHOL 20-0.5 % EX AERO: 1.0000 "application " | INHALATION_SPRAY | CUTANEOUS | Status: DC | PRN

## 2017-12-11 MED ORDER — OXYCODONE-ACETAMINOPHEN 5-325 MG PO TABS
2.0000 | ORAL_TABLET | ORAL | Status: DC | PRN
  Administered 2017-12-11 – 2017-12-13 (×6): 2 via ORAL
  Filled 2017-12-11 (×5): qty 2

## 2017-12-11 MED ORDER — DIPHENHYDRAMINE HCL 50 MG/ML IJ SOLN: 12.5000 mg | INTRAMUSCULAR | Status: DC | PRN

## 2017-12-11 MED ORDER — NIFEDIPINE ER OSMOTIC RELEASE 30 MG PO TB24
30.0000 mg | ORAL_TABLET | Freq: Two times a day (BID) | ORAL | Status: DC
  Administered 2017-12-11 – 2017-12-12 (×3): 30 mg via ORAL
  Filled 2017-12-11 (×3): qty 1

## 2017-12-11 MED ORDER — LACTATED RINGERS IV SOLN
500.0000 mL | Freq: Once | INTRAVENOUS | Status: AC
  Administered 2017-12-11: 250 mL via INTRAVENOUS

## 2017-12-11 MED ORDER — LABETALOL HCL 5 MG/ML IV SOLN
20.0000 mg | Freq: Once | INTRAVENOUS | Status: AC
  Administered 2017-12-11: 20 mg via INTRAVENOUS

## 2017-12-11 MED ORDER — LABETALOL HCL 5 MG/ML IV SOLN
INTRAVENOUS | Status: AC
  Filled 2017-12-11: qty 4

## 2017-12-11 MED ORDER — WITCH HAZEL-GLYCERIN EX PADS: 1.0000 "application " | MEDICATED_PAD | CUTANEOUS | Status: DC | PRN

## 2017-12-11 MED ORDER — MISOPROSTOL 200 MCG PO TABS
800.0000 ug | ORAL_TABLET | Freq: Once | ORAL | Status: AC
  Administered 2017-12-11: 800 ug via RECTAL

## 2017-12-11 MED ORDER — SIMETHICONE 80 MG PO CHEW: 80.0000 mg | CHEWABLE_TABLET | ORAL | Status: DC | PRN

## 2017-12-11 MED ORDER — DIBUCAINE 1 % RE OINT: 1.0000 "application " | TOPICAL_OINTMENT | RECTAL | Status: DC | PRN

## 2017-12-11 MED ORDER — SODIUM BICARBONATE 8.4 % IV SOLN
INTRAVENOUS | Status: DC | PRN
  Administered 2017-12-11 (×2): 5 mL via EPIDURAL

## 2017-12-11 MED ORDER — BUPIVACAINE HCL (PF) 0.25 % IJ SOLN
INTRAMUSCULAR | Status: DC | PRN
  Administered 2017-12-11: 20 mL

## 2017-12-11 MED ORDER — IBUPROFEN 600 MG PO TABS
600.0000 mg | ORAL_TABLET | Freq: Four times a day (QID) | ORAL | Status: DC
  Administered 2017-12-11 – 2017-12-13 (×6): 600 mg via ORAL
  Filled 2017-12-11 (×6): qty 1

## 2017-12-11 MED ORDER — MIDAZOLAM HCL 2 MG/2ML IJ SOLN
INTRAMUSCULAR | Status: AC
  Filled 2017-12-11: qty 2

## 2017-12-11 MED ORDER — ONDANSETRON HCL 4 MG/2ML IJ SOLN: 4.0000 mg | INTRAMUSCULAR | Status: DC | PRN

## 2017-12-11 MED ORDER — COCONUT OIL OIL: 1.0000 "application " | TOPICAL_OIL | Status: DC | PRN

## 2017-12-11 MED ORDER — PHENYLEPHRINE 40 MCG/ML (10ML) SYRINGE FOR IV PUSH (FOR BLOOD PRESSURE SUPPORT)
80.0000 ug | PREFILLED_SYRINGE | INTRAVENOUS | Status: DC | PRN
  Filled 2017-12-11: qty 10

## 2017-12-11 MED ORDER — HYDROMORPHONE HCL 1 MG/ML IJ SOLN
INTRAMUSCULAR | Status: AC
  Filled 2017-12-11: qty 1

## 2017-12-11 MED ORDER — OXYCODONE-ACETAMINOPHEN 5-325 MG PO TABS: 1.0000 | ORAL_TABLET | ORAL | Status: DC | PRN

## 2017-12-11 MED ORDER — FENTANYL CITRATE (PF) 100 MCG/2ML IJ SOLN
INTRAMUSCULAR | Status: DC | PRN
  Administered 2017-12-11: 100 ug via INTRAVENOUS

## 2017-12-11 MED ORDER — FENTANYL 2.5 MCG/ML BUPIVACAINE 1/10 % EPIDURAL INFUSION (WH - ANES)
14.0000 mL/h | INTRAMUSCULAR | Status: DC | PRN
  Administered 2017-12-11: 14 mL/h via EPIDURAL
  Filled 2017-12-11: qty 100

## 2017-12-11 MED ORDER — MISOPROSTOL 50MCG HALF TABLET
50.0000 ug | ORAL_TABLET | ORAL | Status: DC | PRN
  Administered 2017-12-11: 50 ug via BUCCAL
  Filled 2017-12-11: qty 1

## 2017-12-11 MED ORDER — SODIUM BICARBONATE 8.4 % IV SOLN
INTRAVENOUS | Status: AC
  Filled 2017-12-11: qty 50

## 2017-12-11 MED ORDER — ZOLPIDEM TARTRATE 5 MG PO TABS
5.0000 mg | ORAL_TABLET | Freq: Every evening | ORAL | Status: DC | PRN
  Administered 2017-12-11: 5 mg via ORAL
  Filled 2017-12-11: qty 1

## 2017-12-11 MED ORDER — TETANUS-DIPHTH-ACELL PERTUSSIS 5-2.5-18.5 LF-MCG/0.5 IM SUSP: 0.5000 mL | Freq: Once | INTRAMUSCULAR | Status: DC

## 2017-12-11 MED ORDER — EPHEDRINE 5 MG/ML INJ
10.0000 mg | INTRAVENOUS | Status: AC | PRN
  Administered 2017-12-11 (×2): 10 mg via INTRAVENOUS

## 2017-12-11 MED ORDER — FENTANYL CITRATE (PF) 100 MCG/2ML IJ SOLN
INTRAMUSCULAR | Status: AC
  Filled 2017-12-11: qty 2

## 2017-12-11 MED ORDER — OXYTOCIN 40 UNITS IN LACTATED RINGERS INFUSION - SIMPLE MED
1.0000 m[IU]/min | INTRAVENOUS | Status: DC
  Administered 2017-12-11: 2 m[IU]/min via INTRAVENOUS

## 2017-12-11 SURGICAL SUPPLY — 23 items
DRSG OPSITE POSTOP 3X4 (GAUZE/BANDAGES/DRESSINGS) ×3 IMPLANT
DURAPREP 26ML APPLICATOR (WOUND CARE) ×3 IMPLANT
GLOVE BIOGEL PI IND STRL 6.5 (GLOVE) ×1 IMPLANT
GLOVE BIOGEL PI IND STRL 7.0 (GLOVE) ×1 IMPLANT
GLOVE BIOGEL PI INDICATOR 6.5 (GLOVE) ×2
GLOVE BIOGEL PI INDICATOR 7.0 (GLOVE) ×2
GLOVE SURG SS PI 6.0 STRL IVOR (GLOVE) ×3 IMPLANT
GOWN STRL REUS W/TWL LRG LVL3 (GOWN DISPOSABLE) ×6 IMPLANT
NEEDLE HYPO 22GX1.5 SAFETY (NEEDLE) IMPLANT
NS IRRIG 1000ML POUR BTL (IV SOLUTION) ×3 IMPLANT
PACK ABDOMINAL MINOR (CUSTOM PROCEDURE TRAY) ×3 IMPLANT
PROTECTOR NERVE ULNAR (MISCELLANEOUS) ×3 IMPLANT
SPONGE GAUZE 2X2 8PLY STER LF (GAUZE/BANDAGES/DRESSINGS) ×1
SPONGE GAUZE 2X2 8PLY STRL LF (GAUZE/BANDAGES/DRESSINGS) ×2 IMPLANT
SPONGE LAP 4X18 X RAY DECT (DISPOSABLE) IMPLANT
SUT PLAIN 0 NONE (SUTURE) ×3 IMPLANT
SUT VIC AB 0 CT1 27 (SUTURE) ×2
SUT VIC AB 0 CT1 27XBRD ANBCTR (SUTURE) ×1 IMPLANT
SUT VIC AB 3-0 PS2 18 (SUTURE) ×3 IMPLANT
SYR CONTROL 10ML LL (SYRINGE) IMPLANT
TOWEL OR 17X24 6PK STRL BLUE (TOWEL DISPOSABLE) ×6 IMPLANT
TRAY FOLEY CATH SILVER 14FR (SET/KITS/TRAYS/PACK) ×3 IMPLANT
WATER STERILE IRR 1000ML POUR (IV SOLUTION) ×3 IMPLANT

## 2017-12-11 NOTE — Progress Notes (Signed)
Susan Walton is Susan 37 y.o. U2V2536 at [redacted]w[redacted]d  admitted for induction of labor due to Hypertension and with SIPE.  Subjective: Scant blood loss. One decel prior resolved w/bolus. Patient w/headache. Otherwise doing well.   Objective: BP (!) 144/90   Pulse (!) 104   Temp 98 F (36.7 C) (Oral)   Resp 18   Ht 5\' 8"  (1.727 m)   Wt 90 kg (198 lb 6.4 oz)   LMP 03/15/2017   SpO2 98%   BMI 30.17 kg/m  I/O last 3 completed shifts: In: 3846.5 [P.O.:1040; I.V.:2406.5; IV Piggyback:400] Out: 4225 [Urine:4225] Total I/O In: 657.7 [P.O.:60; I.V.:547.7; IV Piggyback:50] Out: 800 [Urine:800]  FHT:  FHR: 120 bpm, variability: moderate,  accelerations:  Present,  decelerations:  Absent UC:   regular, every 3 minutes SVE:   Dilation: 7 Effacement (%): 60 Station: Ballotable Exam by:: dr Susan Walton  Labs: Lab Results  Component Value Date   WBC 12.7 (H) 12/11/2017   HGB 11.3 (L) 12/11/2017   HCT 34.2 (L) 12/11/2017   MCV 86.6 12/11/2017   PLT 234 12/11/2017    Assessment / Plan: Induction of labor due to gestational hypertension,  progressing well off of pitocin  Labor: Progressing normally Preeclampsia:  on magnesium sulfate Fetal Wellbeing:  Category II Pain Control:  Epidural I/D:  n/Susan Anticipated MOD:  NSVD  Susan Walton Susan Walton 12/11/2017, 10:44 AM

## 2017-12-11 NOTE — Progress Notes (Signed)
Mackinzie Vuncannon is a 37 y.o. S1S2395 at [redacted]w[redacted]d admitted for induction of labor due to [facility] with SIPE.  Subjective:  No complaint. Comfortable with epidural. Feeling some occasional pressure.   Objective: BP 133/81   Pulse 97   Temp 98 F (36.7 C) (Oral)   Resp 18   Ht 5\' 8"  (1.727 m)   Wt 198 lb 6.4 oz (90 kg)   LMP 03/15/2017   SpO2 100%   BMI 30.17 kg/m  I/O last 3 completed shifts: In: 3846.5 [P.O.:1040; I.V.:2406.5; IV Piggyback:400] Out: 3202 [Urine:4225] Total I/O In: 3343 [P.O.:240; I.V.:1145; Other:53; IV Piggyback:50] Out: 1250 [Urine:1250]  FHT:  FHR: 130 bpm, variability: moderate,  accelerations:  Present,  decelerations:  Absent UC:   regular, every 3 minutes SVE:   7/80/-2/BBOW  AROM for moderate amount of bloody fluid.  Labs: Lab Results  Component Value Date   WBC 12.7 (H) 12/11/2017   HGB 11.3 (L) 12/11/2017   HCT 34.2 (L) 12/11/2017   MCV 86.6 12/11/2017   PLT 234 12/11/2017    Assessment / Plan: Induction of labor due to [facility] with SIPE ,  progressing well on pitocin  Labor: AROM now, continue to increase pitocin PRN  Preeclampsia:  labs stable Fetal Wellbeing:  Category I Pain Control:  Epidural I/D:  n/a Anticipated MOD:  NSVD  Marcille Buffy 12/11/2017, 1:44 PM

## 2017-12-11 NOTE — Op Note (Signed)
PREOPERATIVE DIAGNOSIS:  Undesired fertility  POSTOPERATIVE DIAGNOSIS:  Undesired fertility  PROCEDURE:  Postpartum Bilateral Tubal Sterilization using Pomeroy method   ANESTHESIA:  Epidural  COMPLICATIONS:  None immediate.  ESTIMATED BLOOD LOSS:  Less than 20cc.  FLUIDS: 300 cc LR.  URINE OUTPUT:  200 cc of clear urine.  INDICATIONS: 37 y.o. yo [id number]  with undesired fertility,status post vaginal delivery, desires permanent sterilization. Risks and benefits of procedure discussed with patient including permanence of method, bleeding, infection, injury to surrounding organs and need for additional procedures. Risk failure of 0.5-1% with increased risk of ectopic gestation if pregnancy occurs was also discussed with patient.   FINDINGS:  Normal uterus, tubes, and ovaries.  TECHNIQUE: After informed consent was obtained, the patient was taken to the operating room where anesthesia was induced and found to be adequate. A small transverse, infraumbilical skin incision was made with the scalpel. This incision was carried down to the underlying layer of fascia. The fascia was grasped with Kocher clamps tented up and entered sharply with Mayo scissors. Underlying peritoneum was then identified tented up and entered sharply with Metzenbaum scissors. The fascia was tagged with 0 Vicryl. The patient's left fallopian tube was then identified, brought to the incision, and grasped with a Babcock clamp. The tube was then followed out to the fimbria. The Babcock clamp was then used to grasp the tube approximately 4 cm from the cornual region. A 3 cm segment of the tube was then ligated with free tie of plain gut suture, transected and excised. Good hemostasis was noted and the tube was returned to the abdomen. The right fallopian tube was then identified to its fimbriated end, ligated, and a 3 cm segment excised in a similar fashion. Excellent hemostasis was noted, and the tube returned to the abdomen. The  fascia was re-approximated with 0 Vicryl. The skin was closed in a subcuticular fashion with 3-0 Vicryl. Quarter percent Marcaine solution was then injected at the incision site. The patient tolerated the procedure well. Sponge, lap, and needle count were correct x2. The patient was taken to recovery room in stable condition.

## 2017-12-11 NOTE — Anesthesia Preprocedure Evaluation (Signed)
Anesthesia Evaluation  Patient identified by MRN, date of birth, ID band Patient awake    Reviewed: Allergy & Precautions, NPO status , Patient's Chart, lab work & pertinent test results  Airway Mallampati: II  TM Distance: >3 FB Neck ROM: Full    Dental no notable dental hx.    Pulmonary neg pulmonary ROS, former smoker,    Pulmonary exam normal breath sounds clear to auscultation       Cardiovascular hypertension, Normal cardiovascular exam Rhythm:Regular Rate:Normal     Neuro/Psych negative neurological ROS  negative psych ROS   GI/Hepatic negative GI ROS, (+)     substance abuse  ,   Endo/Other  negative endocrine ROS  Renal/GU negative Renal ROS  negative genitourinary   Musculoskeletal negative musculoskeletal ROS (+)   Abdominal   Peds negative pediatric ROS (+)  Hematology negative hematology ROS (+)   Anesthesia Other Findings Pre eclampsia  Reproductive/Obstetrics negative OB ROS                             Anesthesia Physical Anesthesia Plan  ASA: III  Anesthesia Plan: Epidural   Post-op Pain Management:    Induction:   PONV Risk Score and Plan:   Airway Management Planned:   Additional Equipment:   Intra-op Plan:   Post-operative Plan:   Informed Consent: I have reviewed the patients History and Physical, chart, labs and discussed the procedure including the risks, benefits and alternatives for the proposed anesthesia with the patient or authorized representative who has indicated his/her understanding and acceptance.     Plan Discussed with:   Anesthesia Plan Comments:         Anesthesia Quick Evaluation

## 2017-12-11 NOTE — Progress Notes (Signed)
Given dilaudid 1 mg for RLQ discomfort.

## 2017-12-11 NOTE — Anesthesia Procedure Notes (Signed)
Epidural Patient location during procedure: OB  Staffing Anesthesiologist: Jameson Morrow, MD Performed: anesthesiologist   Preanesthetic Checklist Completed: patient identified, site marked, surgical consent, pre-op evaluation, timeout performed, IV checked, risks and benefits discussed and monitors and equipment checked  Epidural Patient position: sitting Prep: DuraPrep Patient monitoring: heart rate, continuous pulse ox and blood pressure Approach: right paramedian Location: L3-L4 Injection technique: LOR saline  Needle:  Needle type: Tuohy  Needle gauge: 17 G Needle length: 9 cm and 9 Needle insertion depth: 6 cm Catheter type: closed end flexible Catheter size: 20 Guage Catheter at skin depth: 10 cm Test dose: negative  Assessment Events: blood not aspirated, injection not painful, no injection resistance, negative IV test and no paresthesia  Additional Notes Patient identified. Risks/Benefits/Options discussed with patient including but not limited to bleeding, infection, nerve damage, paralysis, failed block, incomplete pain control, headache, blood pressure changes, nausea, vomiting, reactions to medication both or allergic, itching and postpartum back pain. Confirmed with bedside nurse the patient's most recent platelet count. Confirmed with patient that they are not currently taking any anticoagulation, have any bleeding history or any family history of bleeding disorders. Patient expressed understanding and wished to proceed. All questions were answered. Sterile technique was used throughout the entire procedure. Please see nursing notes for vital signs. Test dose was given through epidural needle and negative prior to continuing to dose epidural or start infusion. Warning signs of high block given to the patient including shortness of breath, tingling/numbness in hands, complete motor block, or any concerning symptoms with instructions to call for help. Patient was given  instructions on fall risk and not to get out of bed. All questions and concerns addressed with instructions to call with any issues.     

## 2017-12-11 NOTE — Anesthesia Procedure Notes (Signed)
Procedure Name: MAC Date/Time: 12/11/2017 4:07 PM Performed by: Genevie Ann, CRNA Pre-anesthesia Checklist: Patient identified, Timeout performed, Emergency Drugs available, Suction available and Patient being monitored Patient Re-evaluated:Patient Re-evaluated prior to induction Oxygen Delivery Method: Nasal cannula

## 2017-12-11 NOTE — Anesthesia Postprocedure Evaluation (Signed)
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  Procedure(s) Performed: AN AD [place]     Patient location during evaluation: [facility] Anesthesia Type: Epidural Level of consciousness: awake and alert and oriented Pain management: pain level controlled Vital Signs Assessment: post-procedure vital signs reviewed and stable Respiratory status: spontaneous breathing and nonlabored ventilation Cardiovascular status: stable Postop Assessment: no headache, no backache, epidural receding, patient able to bend at knees, adequate PO intake and no apparent nausea or vomiting Anesthetic complications: no    Last Vitals:  Vitals:   12/11/17 1745 12/11/17 1807  BP: 126/82 (!) 142/87  Pulse: 91 85  Resp: 16 18  Temp:    SpO2: 100%     Last Pain:  Vitals:   12/11/17 1807  TempSrc:   PainSc: 0-No pain   Pain Goal:                 [employer]

## 2017-12-11 NOTE — Progress Notes (Signed)
Susan Walton is a 37 y.o. [id number] at [redacted]w[redacted]d by admitted for CHTN SI preeclampsia, IOL  Subjective: Vaginal bleeding noted  Objective: BP (!) 143/87   Pulse 85   Temp 98 F (36.7 C) (Oral)   Resp 18   Ht 5\' 8"  (1.727 m)   Wt 90 kg (198 lb 6.4 oz)   LMP 03/15/2017   SpO2 98%   BMI 30.17 kg/m  I/O last 3 completed shifts: In: 3846.5 [P.O.:1040; I.V.:2406.5; IV Piggyback:400] Out: 4225 [Urine:4225] Total I/O In: 657.7 [P.O.:60; I.V.:547.7; IV Piggyback:50] Out: 800 [Urine:800]  FHT:   Fetal Heart Rate A  Mode External filed at 12/11/2017 0940  Baseline Rate (A) 130 bpm filed at 12/11/2017 1000  Variability 6-25 BPM filed at 12/11/2017 1000  Accelerations None filed at 12/11/2017 1000  Decelerations None filed at 12/11/2017 1000      Tracing reviewed  UC:   regular, every 3 minutes SVE:   Dilation: 6 Effacement (%): 60 Station: -3 Exam by:: stone rnc 7 cm by my exam Labs: Lab Results  Component Value Date   WBC 12.5 (H) 12/11/2017   HGB 11.9 (L) 12/11/2017   HCT 35.7 (L) 12/11/2017   MCV 85.8 12/11/2017   PLT 227 12/11/2017    Assessment / Plan: vaginal bleeding, labor progressing  Labor: Progressing normally Preeclampsia:  on magnesium sulfate Fetal Wellbeing:  Category I Pain Control:  Epidural I/D:  PCN for GBS pos Anticipated MOD:  NSVD  Susan Walton 12/11/2017, 10:17 AM

## 2017-12-11 NOTE — Final Progress Note (Signed)
Pt complain of pressure in chest, Blood pressure elevated. MD notified new orders given. Will monitor B/P  and pt closely.

## 2017-12-11 NOTE — Progress Notes (Signed)
37 y.o. yo [id number]  with undesired fertility,status post vaginal delivery, desires permanent sterilization. Risks and benefits of procedure discussed with patient including permanence of method, bleeding, infection, injury to surrounding organs and need for additional procedures. Risk failure of 0.5-1% with increased risk of ectopic gestation if pregnancy occurs was also discussed with patient.

## 2017-12-11 NOTE — Transfer of Care (Signed)
Immediate Anesthesia Transfer of Care Note  Patient: Susan Walton  Procedure(s) Performed: POST PARTUM TUBAL LIGATION (N/A )  Patient Location: PACU  Anesthesia Type:Epidural  Level of Consciousness: awake, alert  and oriented  Airway & Oxygen Therapy: Patient Spontanous Breathing and Patient connected to nasal cannula oxygen  Post-op Assessment: Report given to RN and Post -op Vital signs reviewed and stable  Post vital signs: Reviewed and stable  Last Vitals:  Vitals Value Taken Time  BP 106/61 12/11/2017  4:45 PM  Temp    Pulse    Resp    SpO2    Vitals shown include unvalidated device data.  Last Pain:  Vitals:   12/11/17 1516  TempSrc: Oral  PainSc:          Complications: No apparent anesthesia complications

## 2017-12-11 NOTE — Anesthesia Postprocedure Evaluation (Signed)
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  Procedure(s) Performed: POST PARTUM TUBAL LIGATION (N/A )     Patient location during evaluation: [facility] Unit Anesthesia Type: Epidural Level of consciousness: awake and alert and oriented Pain management: pain level controlled Vital Signs Assessment: post-procedure vital signs reviewed and stable Respiratory status: spontaneous breathing and nonlabored ventilation Cardiovascular status: stable Postop Assessment: no headache, epidural receding, adequate PO intake, patient able to bend at knees, no apparent nausea or vomiting and no backache Anesthetic complications: no    Last Vitals:  Vitals:   12/11/17 1745 12/11/17 1807  BP: 126/82 (!) 142/87  Pulse: 91 85  Resp: 16 18  Temp:    SpO2: 100%     Last Pain:  Vitals:   12/11/17 1807  TempSrc:   PainSc: 0-No pain   Pain Goal:                 [employer]

## 2017-12-11 NOTE — Progress Notes (Signed)
Labor Progress Note Myeesha Shane is a 37 y.o. [id number] at [redacted]w[redacted]d presented for IOL due to cHTN with superimposed severe pre-eclampsia.   S: Patient resting, comfortable, denies any headache or any other symptoms.   O:  BP 123/79   Pulse 91   Temp 98.6 F (37 C) (Axillary)   Resp 18   Ht 5\' 8"  (1.727 m)   Wt 90 kg (198 lb 6.4 oz)   LMP 03/15/2017   BMI 30.17 kg/m   HQR:FXJOITGP rate 130, moderate variability, no acels, two late decels at 0300, none since  Toco: ctx every 4-5 min   CVE: Dilation: 5 Effacement (%): 60 Station: -3 Presentation: Vertex Exam by:: Dr Gerarda Fraction   A&P: 37 y.o. [id number] [redacted]w[redacted]d who presented for IOL due to [place] with superimposed severe pre-eclampsia #Labor: Foley bulb out. Last cytotec given at 0207. Start pitocin at 0600.  #cHTN with severe pre-eclampsia: on magnesium sulfate, remains asymptomatic. BP was elevated to 171/99 at 2151 and patient was given dose of Labetalol at 2159. Normotensive currently. Continue to monitor.  #Pain: per patient request  #FWB: Category I #GBS positive - PCN  Marvis Repress, Medical Student 3:28 AM

## 2017-12-12 LAB — BASIC METABOLIC PANEL
Anion gap: 7 (ref 5–15)
BUN: 5 mg/dL — AB (ref 6–20)
CHLORIDE: 104 mmol/L (ref 101–111)
CO2: 21 mmol/L — AB (ref 22–32)
CREATININE: 0.67 mg/dL (ref 0.44–1.00)
Calcium: 7 mg/dL — ABNORMAL LOW (ref 8.9–10.3)
GFR calc Af Amer: >60 mL/min (ref >60)
GFR calc non Af Amer: >60 mL/min (ref >60)
GLUCOSE: 96 mg/dL (ref 65–99)
Potassium: 4.2 mmol/L (ref 3.5–5.1)
SODIUM: 132 mmol/L — AB (ref 135–145)

## 2017-12-12 LAB — CBC
HEMATOCRIT: 29.3 % — AB (ref 36.0–46.0)
Hemoglobin: 9.8 g/dL — ABNORMAL LOW (ref 12.0–15.0)
MCH: 28.6 pg (ref 26.0–34.0)
MCHC: 33.4 g/dL (ref 30.0–36.0)
MCV: 85.4 fL (ref 78.0–100.0)
Platelets: 219 10*3/uL (ref 150–400)
RBC: 3.43 MIL/uL — ABNORMAL LOW (ref 3.87–5.11)
RDW: 15.1 % (ref 11.5–15.5)
WBC: 12.1 10*3/uL — ABNORMAL HIGH (ref 4.0–10.5)

## 2017-12-12 LAB — MAGNESIUM: Magnesium: 5.2 mg/dL — ABNORMAL HIGH (ref 1.7–2.4)

## 2017-12-12 MED ORDER — MAGNESIUM SULFATE 40 G IN LACTATED RINGERS - SIMPLE
2.0000 g/h | INTRAVENOUS | Status: AC
  Administered 2017-12-12: 2 g/h via INTRAVENOUS
  Filled 2017-12-12: qty 500
  Filled 2017-12-12: qty 40

## 2017-12-12 NOTE — Anesthesia Preprocedure Evaluation (Signed)
Anesthesia Evaluation  Patient identified by MRN, date of birth, ID band Patient awake    Reviewed: Allergy & Precautions, NPO status , Patient's Chart, lab work & pertinent test results  Airway Mallampati: II  TM Distance: >3 FB Neck ROM: Full    Dental no notable dental hx.    Pulmonary former smoker,    Pulmonary exam normal breath sounds clear to auscultation       Cardiovascular hypertension, Normal cardiovascular exam Rhythm:Regular Rate:Normal     Neuro/Psych negative neurological ROS  negative psych ROS   GI/Hepatic negative GI ROS, Neg liver ROS,   Endo/Other  negative endocrine ROS  Renal/GU negative Renal ROS  negative genitourinary   Musculoskeletal negative musculoskeletal ROS (+)   Abdominal   Peds negative pediatric ROS (+)  Hematology negative hematology ROS (+)   Anesthesia Other Findings   Reproductive/Obstetrics (+) Pregnancy                             Anesthesia Physical Anesthesia Plan  ASA: II  Anesthesia Plan: Epidural   Post-op Pain Management:    Induction:   PONV Risk Score and Plan: 2 and Treatment may vary due to age or medical condition  Airway Management Planned: Natural Airway  Additional Equipment:   Intra-op Plan:   Post-operative Plan:   Informed Consent: I have reviewed the patients History and Physical, chart, labs and discussed the procedure including the risks, benefits and alternatives for the proposed anesthesia with the patient or authorized representative who has indicated his/her understanding and acceptance.   Dental advisory given  Plan Discussed with: CRNA  Anesthesia Plan Comments:         Anesthesia Quick Evaluation

## 2017-12-12 NOTE — Progress Notes (Signed)
Post Partum Day 1 after IOL for CHTN with superimposed severe preeclampsia Subjective: No complaints, up ad lib, voiding, tolerating PO and + flatus. No headaches, visual changes, RUQ/epigastric pain.  Bottlefeeding, will try pumping. Baby doing well at bedside.  Objective: Blood pressure 132/70, pulse 87, temperature 98.8 F (37.1 C), temperature source Oral, resp. rate 18, height 5\' 8"  (1.727 m), weight 198 lb 6.4 oz (90 kg), last menstrual period 03/15/2017, SpO2 100 %, unknown if currently breastfeeding.  Patient Vitals for the past 24 hrs:  BP Temp Temp src Pulse Resp SpO2  12/12/17 1200 132/70 98.8 F (37.1 C) Oral 87 18 100 %  12/12/17 0800 135/84 98.1 F (36.7 C) Oral 86 18 100 %  12/12/17 0451 140/90 98.1 F (36.7 C) Oral 92 18 100 %  12/12/17 0200 - - - - 18 -  12/12/17 0100 - - - - 18 -  12/12/17 0042 (!) 147/90 98 F (36.7 C) Oral 85 18 99 %  12/11/17 2344 137/80 98 F (36.7 C) - 79 18 100 %  12/11/17 2245 (!) 156/91 98 F (36.7 C) - 84 18 100 %  12/11/17 2157 (!) 165/93 - - - - -  12/11/17 2120 (!) 189/105 98 F (36.7 C) - - 18 -  12/11/17 1807 (!) 142/87 - - 85 18 -  12/11/17 1745 126/82 - - 91 16 100 %  12/11/17 1731 115/75 (!) 97.5 F (36.4 C) Oral 100 17 100 %  12/11/17 1715 112/76 - - 88 11 97 %  12/11/17 1712 - (!) 97.4 F (36.3 C) Oral - - -  12/11/17 1700 116/72 - - 84 10 100 %  12/11/17 1657 100/77 (!) 95 F (35 C) Oral 79 12 100 %  12/11/17 1645 106/61 - - - - -  12/11/17 1601 137/79 - - 79 - -  12/11/17 1551 (!) 144/94 - - 96 - -  12/11/17 1516 135/81 97.6 F (36.4 C) Oral (!) 104 18 -  12/11/17 1500 135/78 - - (!) 103 - -    Intake/Output Summary (Last 24 hours) at 12/12/2017 1438 Last data filed at 12/12/2017 1414 Gross per 24 hour  Intake 1473.83 ml  Output 2985 ml  Net -1511.17 ml    Physical Exam:  General: alert and no distress Lungs: CTAB Heart: RRR Lochia: appropriate Uterine Fundus: firm DVT Evaluation: No evidence of DVT seen  on physical exam. Negative Homan's sign. No cords or calf tenderness. No significant calf/ankle edema.  Recent Labs    12/11/17 1710 12/12/17 0534  HGB 10.1* 9.8*  HCT 30.9* 29.3*   Results for orders placed or performed during the hospital encounter of 12/10/17 (from the past 24 hour(s))  CBC     Status: Abnormal   Collection Time: 12/11/17  5:10 PM  Result Value Ref Range   WBC 11.3 (H) 4.0 - 10.5 K/uL   RBC 3.59 (L) 3.87 - 5.11 MIL/uL   Hemoglobin 10.1 (L) 12.0 - 15.0 g/dL   HCT 30.9 (L) 36.0 - 46.0 %   MCV 86.1 78.0 - 100.0 fL   MCH 28.1 26.0 - 34.0 pg   MCHC 32.7 30.0 - 36.0 g/dL   RDW 15.1 11.5 - 15.5 %   Platelets 192 150 - 400 K/uL  Comprehensive metabolic panel     Status: Abnormal   Collection Time: 12/11/17  5:10 PM  Result Value Ref Range   Sodium 132 (L) 135 - 145 mmol/L   Potassium 4.0 3.5 - 5.1 mmol/L  Chloride 104 101 - 111 mmol/L   CO2 18 (L) 22 - 32 mmol/L   Glucose, Bld 95 65 - 99 mg/dL   BUN <5 (L) 6 - 20 mg/dL   Creatinine, Ser 0.46 0.44 - 1.00 mg/dL   Calcium 7.2 (L) 8.9 - 10.3 mg/dL   Total Protein 6.3 (L) 6.5 - 8.1 g/dL   Albumin 2.6 (L) 3.5 - 5.0 g/dL   AST 17 15 - 41 U/L   ALT 10 (L) 14 - 54 U/L   Alkaline Phosphatase 111 38 - 126 U/L   Total Bilirubin 0.2 (L) 0.3 - 1.2 mg/dL   GFR calc non Af Amer >60 >60 mL/min   GFR calc Af Amer >60 >60 mL/min   Anion gap 10 5 - 15  Magnesium     Status: Abnormal   Collection Time: 12/11/17  5:10 PM  Result Value Ref Range   Magnesium 4.8 (H) 1.7 - 2.4 mg/dL  CBC     Status: Abnormal   Collection Time: 12/12/17  5:34 AM  Result Value Ref Range   WBC 12.1 (H) 4.0 - 10.5 K/uL   RBC 3.43 (L) 3.87 - 5.11 MIL/uL   Hemoglobin 9.8 (L) 12.0 - 15.0 g/dL   HCT 29.3 (L) 36.0 - 46.0 %   MCV 85.4 78.0 - 100.0 fL   MCH 28.6 26.0 - 34.0 pg   MCHC 33.4 30.0 - 36.0 g/dL   RDW 15.1 11.5 - 15.5 %   Platelets 219 150 - 400 K/uL  Magnesium     Status: Abnormal   Collection Time: 12/12/17  5:34 AM  Result Value Ref  Range   Magnesium 5.2 (H) 1.7 - 2.4 mg/dL  Basic metabolic panel     Status: Abnormal   Collection Time: 12/12/17  5:34 AM  Result Value Ref Range   Sodium 132 (L) 135 - 145 mmol/L   Potassium 4.2 3.5 - 5.1 mmol/L   Chloride 104 101 - 111 mmol/L   CO2 21 (L) 22 - 32 mmol/L   Glucose, Bld 96 65 - 99 mg/dL   BUN 5 (L) 6 - 20 mg/dL   Creatinine, Ser 0.67 0.44 - 1.00 mg/dL   Calcium 7.0 (L) 8.9 - 10.3 mg/dL   GFR calc non Af Amer >60 >60 mL/min   GFR calc Af Amer >60 >60 mL/min   Anion gap 7 5 - 15    Assessment/Plan: CHTN with SIPE: Continue Procardia XL 30 mg po bid.  S/p magnesium sulfate. Continue close monitoring Breastfeeding support to be given Appreciate CSW note and input Analgesia as needed Routine postpartum care   LOS: 2 days   Susan Walton 12/12/2017, 2:36 PM

## 2017-12-12 NOTE — Clinical Social Work Maternal (Signed)
CLINICAL SOCIAL WORK MATERNAL/CHILD NOTE  Patient Details  Name: Susan Walton MRN: [id number] Date of Birth: 11-Nov-1980  Date:  12/12/2017  Clinical Social Worker Initiating Note:  Madilyn Fireman, MSW, LCSW-A Date/Time: Initiated:  12/12/17/1331     Child's Name:      Biological Parents:  Mother   Need for Interpreter:  None   Reason for Referral:  Current Substance Use/Substance Use During Pregnancy , Other (Comment)(Prior CPS involvement)   Address:  [place] [address]    Phone number:  [phone] (home)     Additional phone number:   Household Members/Support Persons (HM/SP):       HM/SP Name Relationship DOB or Age  HM/SP -1        HM/SP -2        HM/SP -3        HM/SP -4        HM/SP -5        HM/SP -6        HM/SP -7        HM/SP -8          Natural Supports (not living in the home):  Children, Extended Family, Immediate Family, Friends   Chiropodist:  N/A  Employment: Unemployed   Type of Work:     Education:  Other (comment)(Did not assess)   Homebound arranged:  N/A  Museum/gallery curator Resources:  Medicaid   Other Resources:  [employer], Physicist, medical    Cultural/Religious Considerations Which May Impact Care:  None  Strengths:  Ability to meet basic needs , Home prepared for child    Psychotropic Medications:         Pediatrician:       Pediatrician List:   [place]      Pediatrician Fax Number:    Risk Factors/Current Problems:  Mental Health Concerns    Cognitive State:  Alert , Able to Concentrate    Mood/Affect:  Comfortable , Calm    CSW Assessment: CSW met with patient, family friend Reggie, infant, and two daughters at bedside to discuss consult. CSW gained permission from patient to discuss identified needs with family members present. CSW inquired about patient's marijuana use during pregnancy, patient  stated that she did smoke marijuana prior to knowing she was pregnant. This infant's name is Dawn. Patient states that she does not yet have a car seat, CSW informed her that the infant cannot be discharged without a seat that is not expired. Patient stated that she will have a seat for the infant prior to being discharged. Patient stated that the infant will sleep in a bassinet at home, Wil[place] educated patient on SIDS and how to reduce the risks. Patient stated that she is well connected with [facility] for mental health. Patient said that she missed her second appointment at [facility] yesterday due to being in the hospital. Patient is not currently on any psychotropic medications and that she was recently diagnosed by [facility] for having Post Traumatic Stress Disorder. CSW inquired about prior CPS involvement with patient, patient stated no. Per chart, patient's older children were transferred from her custody into her mother's back in 2017. CSW informed patient of the hospital drug screening policy for urine and cord testing, patient stated understanding and that she wasn't concerned. Patient receives [place], [employer], and Medicaid.  CSW encouraged patient to reach out for assistance if needs or questions arise.  CSW Plan/Description:  No Further Intervention Required/No Barriers to Discharge, Sudden Infant Death Syndrome (SIDS) Education, Perinatal Mood and Anxiety Disorder (PMADs) Education, [place], CSW Will Continue to Monitor Umbilical Cord Tissue Drug Screen Results and Make Report if Elsie Ra 12/12/2017, 1:36 PM

## 2017-12-12 NOTE — Progress Notes (Signed)
CSW attempted to meet with patient and newborn, upon entering room patient was asleep and snoring. Newborn was being held by patient's young daughter. There was an additional adult in the room, who was also asleep. Patient's daughter attempted to wake patient but she did not arouse. CSW informed young daughter that CSW would return in a few hours.  Susan Walton, MSW, [place] Social Worker [facility] [phone]

## 2017-12-13 ENCOUNTER — Other Ambulatory Visit: Payer: Self-pay

## 2017-12-13 MED ORDER — OXYCODONE-ACETAMINOPHEN 5-325 MG PO TABS: 2.0000 | ORAL_TABLET | Freq: Four times a day (QID) | ORAL | 0 refills | Status: DC | PRN

## 2017-12-13 MED ORDER — NIFEDIPINE ER 60 MG PO TB24: 60.0000 mg | ORAL_TABLET | Freq: Two times a day (BID) | ORAL | 2 refills | Status: DC

## 2017-12-13 MED ORDER — IBUPROFEN 600 MG PO TABS: 600.0000 mg | ORAL_TABLET | Freq: Four times a day (QID) | ORAL | 2 refills | Status: DC | PRN

## 2017-12-13 MED ORDER — FERROUS SULFATE 325 (65 FE) MG PO TABS: 325.0000 mg | ORAL_TABLET | Freq: Two times a day (BID) | ORAL | 1 refills | Status: DC

## 2017-12-13 MED ORDER — NIFEDIPINE ER OSMOTIC RELEASE 30 MG PO TB24
60.0000 mg | ORAL_TABLET | Freq: Two times a day (BID) | ORAL | Status: DC
  Administered 2017-12-13: 60 mg via ORAL
  Filled 2017-12-13: qty 2

## 2017-12-13 MED ORDER — DOCUSATE SODIUM 100 MG PO CAPS: 100.0000 mg | ORAL_CAPSULE | Freq: Two times a day (BID) | ORAL | 2 refills | Status: DC | PRN

## 2017-12-13 NOTE — Discharge Instructions (Signed)
Vaginal Delivery, Care After °Refer to this sheet in the next few weeks. These instructions provide you with information about caring for yourself after vaginal delivery. Your health care provider may also give you more specific instructions. Your treatment has been planned according to current medical practices, but problems sometimes occur. Call your health care provider if you have any problems or questions. °What can I expect after the procedure? °After vaginal delivery, it is common to have: °· Some bleeding from your vagina. °· Soreness in your abdomen, your vagina, and the area of skin between your vaginal opening and your anus (perineum). °· Pelvic cramps. °· Fatigue. ° °Follow these instructions at home: °Medicines °· Take over-the-counter and prescription medicines only as told by your health care provider. °· If you were prescribed an antibiotic medicine, take it as told by your health care provider. Do not stop taking the antibiotic until it is finished. °Driving ° °· Do not drive or operate heavy machinery while taking prescription pain medicine. °· Do not drive for 24 hours if you received a sedative. °Lifestyle °· Do not drink alcohol. This is especially important if you are breastfeeding or taking medicine to relieve pain. °· Do not use tobacco products, including cigarettes, chewing tobacco, or e-cigarettes. If you need help quitting, ask your health care provider. °Eating and drinking °· Drink at least 8 eight-ounce glasses of water every day unless you are told not to by your health care provider. If you choose to breastfeed your baby, you may need to drink more water than this. °· Eat high-fiber foods every day. These foods may help prevent or relieve constipation. High-fiber foods include: °? Whole grain cereals and breads. °? Brown rice. °? Beans. °? Fresh fruits and vegetables. °Activity °· Return to your normal activities as told by your health care provider. Ask your health care provider  what activities are safe for you. °· Rest as much as possible. Try to rest or take a nap when your baby is sleeping. °· Do not lift anything that is heavier than your baby or 10 lb (4.5 kg) until your health care provider says that it is safe. °· Talk with your health care provider about when you can engage in sexual activity. This may depend on your: °? Risk of infection. °? Rate of healing. °? Comfort and desire to engage in sexual activity. °Vaginal Care °· If you have an episiotomy or a vaginal tear, check the area every day for signs of infection. Check for: °? More redness, swelling, or pain. °? More fluid or blood. °? Warmth. °? Pus or a bad smell. °· Do not use tampons or douches until your health care provider says this is safe. °· Watch for any blood clots that may pass from your vagina. These may look like clumps of dark red, brown, or black discharge. °General instructions °· Keep your perineum clean and dry as told by your health care provider. °· Wear loose, comfortable clothing. °· Wipe from front to back when you use the toilet. °· Ask your health care provider if you can shower or take a bath. If you had an episiotomy or a perineal tear during labor and delivery, your health care provider may tell you not to take baths for a certain length of time. °· Wear a bra that supports your breasts and fits you well. °· If possible, have someone help you with household activities and help care for your baby for at least a few days after   you leave the hospital. °· Keep all follow-up visits for you and your baby as told by your health care provider. This is important. °Contact a health care provider if: °· You have: °? Vaginal discharge that has a bad smell. °? Difficulty urinating. °? Pain when urinating. °? A sudden increase or decrease in the frequency of your bowel movements. °? More redness, swelling, or pain around your episiotomy or vaginal tear. °? More fluid or blood coming from your episiotomy or  vaginal tear. °? Pus or a bad smell coming from your episiotomy or vaginal tear. °? A fever. °? A rash. °? Little or no interest in activities you used to enjoy. °? Questions about caring for yourself or your baby. °· Your episiotomy or vaginal tear feels warm to the touch. °· Your episiotomy or vaginal tear is separating or does not appear to be healing. °· Your breasts are painful, hard, or turn red. °· You feel unusually sad or worried. °· You feel nauseous or you vomit. °· You pass large blood clots from your vagina. If you pass a blood clot from your vagina, save it to show to your health care provider. Do not flush blood clots down the toilet without having your health care provider look at them. °· You urinate more than usual. °· You are dizzy or light-headed. °· You have not breastfed at all and you have not had a menstrual period for 12 weeks after delivery. °· You have stopped breastfeeding and you have not had a menstrual period for 12 weeks after you stopped breastfeeding. °Get help right away if: °· You have: °? Pain that does not go away or does not get better with medicine. °? Chest pain. °? Difficulty breathing. °? Blurred vision or spots in your vision. °? Thoughts about hurting yourself or your baby. °· You develop pain in your abdomen or in one of your legs. °· You develop a severe headache. °· You faint. °· You bleed from your vagina so much that you fill two sanitary pads in one hour. °This information is not intended to replace advice given to you by your health care provider. Make sure you discuss any questions you have with your health care provider. °Document Released: 08/29/2000 Document Revised: 02/13/2016 Document Reviewed: 09/16/2015 °Elsevier Interactive Patient Education © 2018 [employer] ° ° °Postpartum Tubal Ligation, Care After °Refer to this sheet in the next few weeks. These instructions provide you with information about caring for yourself after your procedure. Your health  care provider may also give you more specific instructions. Your treatment has been planned according to current medical practices, but problems sometimes occur. Call your health care provider if you have any problems or questions after your procedure. °What can I expect after the procedure? °After the procedure, it is common to have: °· A sore throat. °· Bruising or pain in your back. °· Nausea or vomiting. °· Dizziness. °· Mild abdominal discomfort or pain, such as cramping, gas pain, or feeling bloated. °· Soreness where the incision was made. °· Tiredness. °· Pain in your shoulders. ° °Follow these instructions at home: °Medicines °· Take over-the-counter and prescription medicines only as told by your health care provider. °· Do not take aspirin because it can cause bleeding. °· Do not drive or operate heavy machinery while taking prescription pain medicine. °Activity °· Rest for the rest of the day. °· Gradually return to your normal activities over the next few days. °· Do not have sex, douche,   or put a tampon or anything else in your vagina for 6 weeks or as long as told by your health care provider.  Do not lift anything that is heavier than your baby for 2 weeks or as long as told by your health care provider. Incision care  Follow instructions from your health care provider about how to take care of your incision. Make sure you: ? Wash your hands with soap and water before you change your bandage (dressing). If soap and water are not available, use hand sanitizer. ? Change your dressing as told by your health care provider. ? Leave stitches (sutures) in place. They may need to stay in place for 2 weeks or longer.  Check your incision area every day for signs of infection. Check for: ? More redness, swelling, or pain. ? More fluid or blood. ? Warmth. ? Pus or a bad smell. Other Instructions  Do not take baths, swim, or use a hot tub until your health care provider approves. You may take  showers.  Keep all follow-up visits as told by your health care provider. This is important. Contact a health care provider if:  You have more redness, swelling, or pain around your incision.  Your incision feels warm to the touch.  You have pus or a bad smell coming from your incision.  The edges of your incision break open after the sutures have been removed.  Your pain does not improve after 2-3 days.  You have a rash.  You repeatedly become dizzy or lightheaded.  Your pain medicine is not helping.  You are constipated. Get help right away if:  You have a fever.  You faint.  You have pain in your abdomen that gets worse.  You have fluid or blood coming from your sutures.  You have shortness of breath or difficulty breathing.  You have chest pain or leg pain.  You have ongoing nausea or diarrhea. This information is not intended to replace advice given to you by your health care provider. Make sure you discuss any questions you have with your health care provider. Document Released: 03/02/2012 Document Revised: 02/04/2016 Document Reviewed: 08/12/2015 Elsevier Interactive Patient Education  2018 [employer].    Preeclampsia and Eclampsia Preeclampsia is a serious condition that develops only during pregnancy. It is also called toxemia of pregnancy. This condition causes high blood pressure along with other symptoms, such as swelling and headaches. These symptoms may develop as the condition gets worse. Preeclampsia may occur at 20 weeks of pregnancy or later. Diagnosing and treating preeclampsia early is very important. If not treated early, it can cause serious problems for you and your baby. One problem it can lead to is eclampsia, which is a condition that causes muscle jerking or shaking (convulsions or seizures) in the mother. Delivering your baby is the best treatment for preeclampsia or eclampsia. Preeclampsia and eclampsia symptoms usually go away after your  baby is born. What are the causes? The cause of preeclampsia is not known. What increases the risk? The following risk factors make you more likely to develop preeclampsia:  Being pregnant for the first time.  Having had preeclampsia during a past pregnancy.  Having a family history of preeclampsia.  Having high blood pressure.  Being pregnant with twins or triplets.  Being 20 or older.  Being African-American.  Having kidney disease or diabetes.  Having medical conditions such as lupus or blood diseases.  Being very overweight (obese).  What are the signs or  symptoms? The earliest signs of preeclampsia are:  High blood pressure.  Increased protein in your urine. Your health care provider will check for this at every visit before you give birth (prenatal visit).  Other symptoms that may develop as the condition gets worse include:  Severe headaches.  Sudden weight gain.  Swelling of the hands, face, legs, and feet.  Nausea and vomiting.  Vision problems, such as blurred or double vision.  Numbness in the face, arms, legs, and feet.  Urinating less than usual.  Dizziness.  Slurred speech.  Abdominal pain, especially upper abdominal pain.  Convulsions or seizures.  Symptoms generally go away after giving birth. How is this diagnosed? There are no screening tests for preeclampsia. Your health care provider will ask you about symptoms and check for signs of preeclampsia during your prenatal visits. You may also have tests that include:  Urine tests.  Blood tests.  Checking your blood pressure.  Monitoring your babys heart rate.  Ultrasound.  How is this treated? You and your health care provider will determine the treatment approach that is best for you. Treatment may include:  Having more frequent prenatal exams to check for signs of preeclampsia, if you have an increased risk for preeclampsia.  Bed rest.  Reducing how much salt (sodium) you  eat.  Medicine to lower your blood pressure.  Staying in the hospital, if your condition is severe. There, treatment will focus on controlling your blood pressure and the amount of fluids in your body (fluid retention).  You may need to take medicine (magnesium sulfate) to prevent seizures. This medicine may be given as an injection or through an IV tube.  Delivering your baby early, if your condition gets worse. You may have your labor started with medicine (induced), or you may have a cesarean delivery.  Follow these instructions at home: Eating and drinking   Drink enough fluid to keep your urine clear or pale yellow.  Eat a healthy diet that is low in sodium. Do not add salt to your food. Check nutrition labels to see how much sodium a food or beverage contains.  Avoid caffeine. Lifestyle  Do not use any products that contain nicotine or tobacco, such as cigarettes and e-cigarettes. If you need help quitting, ask your health care provider.  Do not use alcohol or drugs.  Avoid stress as much as possible. Rest and get plenty of sleep. General instructions  Take over-the-counter and prescription medicines only as told by your health care provider.  When lying down, lie on your side. This keeps pressure off of your baby.  When sitting or lying down, raise (elevate) your feet. Try putting some pillows underneath your lower legs.  Exercise regularly. Ask your health care provider what kinds of exercise are best for you.  Keep all follow-up and prenatal visits as told by your health care provider. This is important. How is this prevented? To prevent preeclampsia or eclampsia from developing during another pregnancy:  Get proper medical care during pregnancy. Your health care provider may be able to prevent preeclampsia or diagnose and treat it early.  Your health care provider may have you take a low-dose aspirin or a calcium supplement during your next pregnancy.  You may  have tests of your blood pressure and kidney function after giving birth.  Maintain a healthy weight. Ask your health care provider for help managing weight gain during pregnancy.  Work with your health care provider to manage any long-term (chronic) health conditions  you have, such as diabetes or kidney problems.  Contact a health care provider if:  You gain more weight than expected.  You have headaches.  You have nausea or vomiting.  You have abdominal pain.  You feel dizzy or light-headed. Get help right away if:  You develop sudden or severe swelling anywhere in your body. This usually happens in the legs.  You gain 5 lbs (2.3 kg) or more during one week.  You have severe: ? Abdominal pain. ? Headaches. ? Dizziness. ? Vision problems. ? Confusion. ? Nausea or vomiting.  You have a seizure.  You have trouble moving any part of your body.  You develop numbness in any part of your body.  You have trouble speaking.  You have any abnormal bleeding.  You pass out. This information is not intended to replace advice given to you by your health care provider. Make sure you discuss any questions you have with your health care provider. Document Released: 08/29/2000 Document Revised: 04/29/2016 Document Reviewed: 04/07/2016 Elsevier Interactive Patient Education  [employer].

## 2017-12-13 NOTE — Progress Notes (Addendum)
9:30am: CSW revisited patient's room per request of nursing staff. Patient, two daughters, and infant all asleep and did not awaken when CSW said patient's name. Patient has prior CPS involvement per chart. CSW to make report to [place] just to notify them of new birth and that the teenage children have been constantly at the hospital without their legal caregiver.  9:55am: CSW spoke with [place] DSS to complete report. CSW provided all relevant information to Mr. Donnetta Hutching.   There are no barriers to discharge at this time.  Madilyn Fireman, MSW, [place] Social Work[facility] [phone]

## 2017-12-13 NOTE — Discharge Summary (Signed)
OB Discharge Summary     Patient Name: Susan Walton DOB: [birth year] MRN: [id number]  Date of admission: 12/10/2017 Delivering MD: Rennis Chris A   Date of discharge: 12/13/2017  Admitting diagnosis: 23.6WKS HTN  Intrauterine pregnancy: [redacted]w[redacted]d     Secondary diagnosis:  Principal Problem:   Chronic hypertension with superimposed severe preeclampsia Active Problems:   Drug abuse during pregnancy ([place])   Unwanted fertility   Gonorrhea affecting pregnancy in first trimester  Additional problems:None     Discharge diagnosis: Term Pregnancy Delivered and Preeclampsia (severe)                                                         Post partum procedures: Postpartum tubal ligation and magnesium sulfate eclampsia prophylaxis  Induction: AROM, Pitocin, Cytotec and Foley Balloon  Complications: None  Hospital course:  Induction of Labor With Vaginal Delivery   37 y.o. yo [id number] at [redacted]w[redacted]d was admitted to the hospital 12/10/2017 for induction of labor.  Indication for induction: Severe Preeclampsia.  Patient had an uncomplicated labor course as follows: Membrane Rupture Time/Date: 1:39 PM ,12/11/2017   Intrapartum Procedures: Episiotomy: None [1]                                         Lacerations:  None [1]  Patient had delivery of a Viable infant.  Information for the patient's newborn:  Judieth, [place] [[id number]]  Delivery Method: Vaginal, Spontaneous(Filed from Delivery Summary) Details of delivery can be found in separate delivery note. She also had uncomplicated postpartum BTS. Was on magnesium sulfate during labor and postpartum. Required a few doses of IV medications to control BP, but sent home on Procardia XL 60 mg bid.  Patient otherwise had a routine postpartum course. Patient is discharged home 12/13/17.  Physical exam  Vitals:   12/12/17 1528 12/12/17 2000 12/13/17 0010 12/13/17 0420  BP: 139/86 (!) 152/89 131/66 (!) 152/92  Pulse: 88 75 85 78  Resp: 18 16 17 18    Temp: 98.5 F (36.9 C) 97.7 F (36.5 C) 98.6 F (37 C) 98.5 F (36.9 C)  TempSrc: Oral  Oral Oral  SpO2: 100% 100% 97% 100%  Weight:      Height:       General: alert and no distress Lochia: appropriate Uterine Fundus: firm Incision: N/A DVT Evaluation: No evidence of DVT seen on physical exam. Negative Homan's sign. No cords or calf tenderness. No significant calf/ankle edema. Labs: Lab Results  Component Value Date   WBC 12.1 (H) 12/12/2017   HGB 9.8 (L) 12/12/2017   HCT 29.3 (L) 12/12/2017   MCV 85.4 12/12/2017   PLT 219 12/12/2017   CMP Latest Ref Rng & Units 12/12/2017  Glucose 65 - 99 mg/dL 96  BUN 6 - 20 mg/dL 5(L)  Creatinine 0.44 - 1.00 mg/dL 0.67  Sodium 135 - 145 mmol/L 132(L)  Potassium 3.5 - 5.1 mmol/L 4.2  Chloride 101 - 111 mmol/L 104  CO2 22 - 32 mmol/L 21(L)  Calcium 8.9 - 10.3 mg/dL 7.0(L)  Total Protein 6.5 - 8.1 g/dL -  Total Bilirubin 0.3 - 1.2 mg/dL -  Alkaline Phos 38 - 126 U/L -  AST 15 - 41 U/L -  ALT 14 - 54 U/L -    Discharge instruction: per After Visit Summary and "Baby and Me Booklet".  After visit meds:  Allergies as of 12/13/2017   No Known Allergies     Medication List    STOP taking these medications   acetaminophen 325 MG tablet Commonly known as:  TYLENOL   aspirin 81 MG tablet   diphenhydrAMINE 25 MG tablet Commonly known as:  BENADRYL   labetalol 200 MG tablet Commonly known as:  NORMODYNE     TAKE these medications   docusate sodium 100 MG capsule Commonly known as:  COLACE Take 1 capsule (100 mg total) by mouth 2 (two) times daily as needed for mild constipation or moderate constipation.   ferrous sulfate 325 (65 FE) MG tablet Commonly known as:  FERROUSUL Take 1 tablet (325 mg total) by mouth 2 (two) times daily.   ibuprofen 600 MG tablet Commonly known as:  ADVIL,MOTRIN Take 1 tablet (600 mg total) by mouth every 6 (six) hours as needed for mild pain, moderate pain or cramping.   NAT-RUL PRENATAL  VITAMINS 28-0.8 MG Tabs Take 1 tablet by mouth daily.   NIFEdipine 60 MG 24 hr tablet Commonly known as:  PROCARDIA-XL/ADALAT CC Take 1 tablet (60 mg total) by mouth 2 (two) times daily.   oxyCODONE-acetaminophen 5-325 MG tablet Commonly known as:  PERCOCET/ROXICET Take 2 tablets by mouth every 6 (six) hours as needed for severe pain (pain scale > 7).       Diet: routine diet  Activity: Advance as tolerated. Pelvic rest for 6 weeks.   Outpatient follow up:1 week for BP check Follow up Appt:No future appointments. Follow up Visit:No follow-ups on file.  Postpartum contraception: Tubal Ligation  Newborn Data: Live born female  Birth Weight: 6 lb 4.4 oz (2846 g) APGAR: 64, 9  Newborn Delivery   Birth date/time:  12/11/2017 14:56:00 Delivery type:  Vaginal, Spontaneous     Baby Feeding: Bottle Disposition: ?Home with mother pending CSW recommendations   12/13/2017 Verita Schneiders, MD

## 2017-12-14 ENCOUNTER — Encounter (HOSPITAL_COMMUNITY): Payer: Self-pay

## 2017-12-16 ENCOUNTER — Encounter (HOSPITAL_COMMUNITY): Payer: Self-pay | Admitting: *Deleted

## 2017-12-16 ENCOUNTER — Other Ambulatory Visit: Payer: Self-pay

## 2017-12-16 ENCOUNTER — Emergency Department (HOSPITAL_COMMUNITY)
Admission: EM | Admit: 2017-12-16 | Discharge: 2017-12-16 | Disposition: A | Discharge status: Home / Self Care | Payer: Medicaid Other | Attending: Emergency Medicine | Admitting: Emergency Medicine

## 2017-12-16 DIAGNOSIS — K5900 Constipation, unspecified: Secondary | ICD-10-CM | POA: Diagnosis not present

## 2017-12-16 DIAGNOSIS — Z87891 Personal history of nicotine dependence: Secondary | ICD-10-CM | POA: Diagnosis not present

## 2017-12-16 DIAGNOSIS — I10 Essential (primary) hypertension: Secondary | ICD-10-CM | POA: Diagnosis not present

## 2017-12-16 MED ORDER — POLYETHYLENE GLYCOL 3350 17 G PO PACK: 17.0000 g | PACK | Freq: Every day | ORAL | 0 refills | Status: DC

## 2017-12-16 MED ORDER — FLEET ENEMA 7-19 GM/118ML RE ENEM
1.0000 | ENEMA | Freq: Once | RECTAL | Status: AC
  Administered 2017-12-16: 1 via RECTAL
  Filled 2017-12-16: qty 1

## 2017-12-16 MED ORDER — MAGNESIUM CITRATE PO SOLN
1.0000 | Freq: Once | ORAL | Status: AC
  Administered 2017-12-16: 1 via ORAL
  Filled 2017-12-16: qty 296

## 2017-12-16 NOTE — ED Triage Notes (Signed)
Pt in c/o constipation, last BM 7 days ago, pt had vaginal delivery 5 days ago, reports abd pain at this time

## 2017-12-16 NOTE — Discharge Instructions (Signed)
Return to the ED with any concerns including abdominal pain, vomiting, fever/chills, fainting, decreased level of alertness/lethargy, or any other alarming symptoms  You should be sure to continue taking a stool softener- the oxycodone you are taking for pain is a big cause of constipation so it is important that you take a stool softener as well

## 2017-12-16 NOTE — ED Provider Notes (Signed)
[facility] EMERGENCY DEPARTMENT Provider Note   CSN: [id number] Arrival date & time: 12/16/17  0815     History   Chief Complaint Chief Complaint  Patient presents with  . Constipation    HPI Susan Walton is a 37 y.o. female.  HPI  Patient presenting with complaint of constipation.  She gave birth vaginally approximately 5 days ago and also had a tubal ligation.  She states she has been taking ibuprofen and Percocet for the pain but did not have money for the Colace that was prescribed.  She states it is been 7 days since bowel movement and that she has been straining to pass a bowel movement.  She feels that this is making her surgical pain worse.  Patient denies abdominal pain, vomiting, no fever or chills.  She is requesting an enema.  There are no other associated systemic symptoms, there are no other alleviating or modifying factors.   Past Medical History:  Diagnosis Date  . Anemia   . Fibroid   . Hypertension   . Retinal detachment   . Thyroid disease    Hypothyroidism    Patient Active Problem List   Diagnosis Date Noted  . Group B Streptococcus carrier, +RV culture, currently pregnant 12/10/2017  . Gonorrhea affecting pregnancy in first trimester 12/10/2017  . 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 Pregnancy, supervision, high-risk 07/27/2017  . Chronic hypertension with superimposed severe preeclampsia 07/27/2017  . 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 Thyroid dysfunction, antepartum 07/27/2017  . History of trichomoniasis 07/27/2017  . Unwanted fertility 07/27/2017  . Advanced maternal age in multigravida, second trimester   . Adjustment disorder with depressed mood 06/05/2017    Past Surgical History:  Procedure Laterality Date  . DILATION AND CURETTAGE OF UTERUS    . EYE SURGERY    . NO PAST SURGERIES    . TUBAL LIGATION N/A 12/11/2017   Procedure: POST PARTUM TUBAL LIGATION;  Surgeon:  [name], MD;  Location: [facility];  Service: Gynecology;  Laterality: N/A;     OB History    Gravida  8   Para  5   Term  5   Preterm  0   AB  3   Living  5     SAB  2   TAB  1   Ectopic  0   Multiple  0   Live Births  5            Home Medications    Prior to Admission medications   Medication Sig Start Date End Date Taking? Authorizing Provider  docusate sodium (COLACE) 100 MG capsule Take 1 capsule (100 mg total) by mouth 2 (two) times daily as needed for mild constipation or moderate constipation. 12/13/17   Anyanwu, Sallyanne Havers, MD  ferrous sulfate (FERROUSUL) 325 (65 FE) MG tablet Take 1 tablet (325 mg total) by mouth 2 (two) times daily. 12/13/17   Anyanwu, Sallyanne Havers, MD  ibuprofen (ADVIL,MOTRIN) 600 MG tablet Take 1 tablet (600 mg total) by mouth every 6 (six) hours as needed for mild pain, moderate pain or cramping. 12/13/17   Anyanwu, Sallyanne Havers, MD  NIFEdipine (PROCARDIA-XL/ADALAT CC) 60 MG 24 hr tablet Take 1 tablet (60 mg total) by mouth 2 (two) times daily. 12/13/17   Anyanwu, Sallyanne Havers, MD  oxyCODONE-acetaminophen (PERCOCET/ROXICET) 5-325 MG tablet Take 2 tablets by mouth every 6 (six) hours as needed for severe pain (pain scale > 7).  12/13/17   Anyanwu, Sallyanne Havers, MD  polyethylene glycol (MIRALAX) packet Take 17 g by mouth daily. 12/16/17   Mabe, Forbes Cellar, MD  Prenatal Vit-Fe Fumarate-FA (NAT-RUL PRENATAL VITAMINS) 28-0.8 MG TABS Take 1 tablet by mouth daily.    [provider]    Family History History reviewed. No pertinent family history.  Social History Social History   Tobacco Use  . Smoking status: Former Smoker    Packs/day: 0.50    Types: Cigarettes  . Smokeless tobacco: Never Used  Substance Use Topics  . Alcohol use: Not Currently    Comment: quit mth after +UPT  . Drug use: No    Types: Marijuana, Cocaine    Comment: quit 1 mth after +UPT     Allergies   Patient has no known allergies.   Review of  Systems Review of Systems  ROS reviewed and all otherwise negative except for mentioned in HPI   Physical Exam Updated Vital Signs BP (!) 129/100 (BP Location: Left Arm)   Pulse 100   Temp 98.1 F (36.7 C) (Oral)   Resp 20   LMP 03/15/2017   SpO2 99%  Vitals reviewed Physical Exam  Physical Examination: General appearance - alert, well appearing, and in no distress Mental status - alert, oriented to person, place, and time Eyes -no conjunctival injection, no scleral icterus Chest - clear to auscultation, no wheezes, rales or rhonchi, symmetric air entry Heart - normal rate, regular rhythm, normal S1, S2, no murmurs, rubs, clicks or gallops Abdomen - soft, nontender, nondistended, no masses or organomegaly Neurological - alert, oriented, normal speech Extremities - peripheral pulses normal, no pedal edema, no clubbing or cyanosis Skin - normal coloration and turgor, no rashes   ED Treatments / Results  Labs (all labs ordered are listed, but only abnormal results are displayed) Labs Reviewed - No data to display  EKG None  Radiology No results found.  Procedures Procedures (including critical care time)  Medications Ordered in ED Medications  sodium phosphate (FLEET) 7-19 GM/118ML enema 1 enema (1 enema Rectal Given 12/16/17 0904)  magnesium citrate solution 1 Bottle (1 Bottle Oral Given 12/16/17 0945)     Initial Impression / Assessment and Plan / ED Course  I have reviewed the triage vital signs and the nursing notes.  Pertinent labs & imaging results that were available during my care of the patient were reviewed by me and considered in my medical decision making (see chart for details).    9:56 AM pt had no relief after fleets enema.  No impaction distally.  Will give soap suds enema and patient is drinking mag citrate now.  Will obtain KUB as well.    10:09 AM pt has now had large BM and feels improved.  Cancelled xray, soap suds enema.    Patient presenting  with complaint of constipation after delivery of infant.  She has been taking oxycodone and ibuprofen but states she could not afford the Colace.  She has had a bowel movement after enema in the ED.  She feels improved.  I have stressed to her the importance of taking a stool softener as the oxycodone will cause constipation.  Discharged with strict return precautions.  Pt agreeable with plan.  Final Clinical Impressions(s) / ED Diagnoses   Final diagnoses:  Constipation, unspecified constipation type    ED Discharge Orders        Ordered    polyethylene glycol (MIRALAX) packet  Daily     12/16/17  Clarkston, Martha L, MD 12/16/17 1037

## 2017-12-21 ENCOUNTER — Telehealth: Payer: Self-pay | Admitting: General Practice

## 2017-12-21 NOTE — Telephone Encounter (Signed)
Susan Walton from [facility] called and left a message on nurse line stating she went out to the patient's home for a BP check and it was 130/72. She called and set up a BP check appt per patient's discharge instructions for 4/9 @ 10am.

## 2017-12-22 ENCOUNTER — Ambulatory Visit: Payer: Self-pay

## 2017-12-22 ENCOUNTER — Institutional Professional Consult (permissible substitution): Payer: Self-pay

## 2017-12-22 NOTE — BH Specialist Note (Deleted)
Integrated Behavioral Health Initial Visit  MRN: [id number] Name: Susan Walton  Number of [place] Clinician visits:: 1/6 Session Start time: ***  Session End time: *** Total time: {IBH Total Time:21014050}  Type of Service: [place] Interpretor:No. Interpretor Name and Language: n/a   Warm Hand Off Completed.       SUBJECTIVE: Susan Walton is a 37 y.o. female accompanied by {CHL AMB ACCOMPANIED BY:[phone]} Patient was referred by Dr Elly Modena for hx substance use and psychosocial *** Patient reports the following symptoms/concerns: *** Duration of problem: ***; Severity of problem: {Mild/Moderate/Severe:20260}  OBJECTIVE: Mood: {BHH MOOD:22306} and Affect: {BHH AFFECT:22307} Risk of harm to self or others: {CHL AMB BH Suicide Current Mental Status:21022748}  LIFE CONTEXT: Family and Social: *** School/Work: *** Self-Care: *** Life Changes: Recent childbirth ***  GOALS ADDRESSED: Patient will: 1. Reduce symptoms of: {IBH Symptoms:21014056} 2. Increase knowledge and/or ability of: {IBH Patient Tools:21014057}  3. Demonstrate ability to: {IBH Goals:21014053}  INTERVENTIONS: Interventions utilized: {IBH Interventions:21014054}  Standardized Assessments completed: {IBH Screening Tools:21014051}  ASSESSMENT: Patient currently experiencing ***.   Patient may benefit from psychoeducation and brief therapeutic interventions regarding coping with symptoms of *** .  PLAN: 1. Follow up with behavioral health clinician on : *** 2. Behavioral recommendations:  -*** -*** 3. Referral(s): {IBH Referrals:21014055} 4. "From scale of 1-10, how likely are you to follow plan?": ***  Garlan Fair, LCSW      Depression screen [facility] 2/9 11/05/2017 10/20/2017 09/09/2017 07/27/2017  Decreased Interest 0 0 0 1  Down, Depressed, Hopeless 0 0 0 1  PHQ - 2 Score 0 0 0 2  Altered sleeping 0 0 3 1  Tired, decreased energy  0 0 1 1  Change in appetite 0 0 3 1  Feeling bad or failure about yourself  0 0 0 1  Trouble concentrating 0 0 1 1  Moving slowly or fidgety/restless 0 0 3 1  Suicidal thoughts 0 0 0 0  PHQ-9 Score 0 0 11 8   GAD 7 : Generalized Anxiety Score 11/05/2017 10/20/2017 09/09/2017 07/27/2017  Nervous, Anxious, on Edge 0 0 0 1  Control/stop worrying 0 2 1 1   Worry too much - different things 0 1 1 1   Trouble relaxing 0 2 1 1   Restless 0 3 1 1   Easily annoyed or irritable 0 1 1 1   Afraid - awful might happen 0 1 1 1   Total GAD 7 Score 0 10 6 7

## 2018-01-18 ENCOUNTER — Ambulatory Visit: Payer: Self-pay | Admitting: Advanced Practice Midwife

## 2018-01-22 ENCOUNTER — Emergency Department (HOSPITAL_COMMUNITY)
Admission: EM | Admit: 2018-01-22 | Discharge: 2018-01-23 | Disposition: A | Discharge status: Home / Self Care | Payer: Medicaid Other | Attending: Emergency Medicine | Admitting: Emergency Medicine

## 2018-01-22 ENCOUNTER — Emergency Department (HOSPITAL_COMMUNITY): Payer: Medicaid Other

## 2018-01-22 ENCOUNTER — Other Ambulatory Visit: Payer: Self-pay

## 2018-01-22 DIAGNOSIS — Y9389 Activity, other specified: Secondary | ICD-10-CM | POA: Diagnosis not present

## 2018-01-22 DIAGNOSIS — Y999 Unspecified external cause status: Secondary | ICD-10-CM | POA: Diagnosis not present

## 2018-01-22 DIAGNOSIS — Y92410 Unspecified street and highway as the place of occurrence of the external cause: Secondary | ICD-10-CM | POA: Diagnosis not present

## 2018-01-22 DIAGNOSIS — K225 Diverticulum of esophagus, acquired: Secondary | ICD-10-CM | POA: Diagnosis not present

## 2018-01-22 DIAGNOSIS — M545 Low back pain, unspecified: Secondary | ICD-10-CM

## 2018-01-22 DIAGNOSIS — M542 Cervicalgia: Secondary | ICD-10-CM | POA: Insufficient documentation

## 2018-01-22 DIAGNOSIS — I10 Essential (primary) hypertension: Secondary | ICD-10-CM | POA: Diagnosis not present

## 2018-01-22 DIAGNOSIS — Z3202 Encounter for pregnancy test, result negative: Secondary | ICD-10-CM | POA: Insufficient documentation

## 2018-01-22 DIAGNOSIS — Z87891 Personal history of nicotine dependence: Secondary | ICD-10-CM | POA: Diagnosis not present

## 2018-01-22 DIAGNOSIS — Z79899 Other long term (current) drug therapy: Secondary | ICD-10-CM | POA: Insufficient documentation

## 2018-01-22 DIAGNOSIS — Q396 Congenital diverticulum of esophagus: Secondary | ICD-10-CM

## 2018-01-22 DIAGNOSIS — E039 Hypothyroidism, unspecified: Secondary | ICD-10-CM | POA: Diagnosis not present

## 2018-01-22 LAB — PREGNANCY, URINE: Preg Test, Ur: NEGATIVE

## 2018-01-22 MED ORDER — IBUPROFEN 200 MG PO TABS
600.0000 mg | ORAL_TABLET | Freq: Once | ORAL | Status: AC
  Administered 2018-01-23: 600 mg via ORAL
  Filled 2018-01-22: qty 1

## 2018-01-22 NOTE — ED Notes (Signed)
gingerale to mom; mom aware urine sample needed

## 2018-01-22 NOTE — ED Notes (Signed)
MD at bedside. 

## 2018-01-22 NOTE — ED Notes (Signed)
Patient transported to X-ray 

## 2018-01-22 NOTE — ED Notes (Signed)
Pt transported to CT ?
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
## 2018-01-22 NOTE — ED Notes (Signed)
Pt ambulated to bathroom to provide urine sample & then reported she missed the urine cup & could not pee any more; pt was checked on a few times & stayed in the bathroom with the water running

## 2018-01-22 NOTE — ED Notes (Signed)
(  correction: urine sample was provided by pt, collected & sent to lab approx [id number], not 2101)

## 2018-01-23 NOTE — ED Notes (Signed)
Pt. alert & interactive during discharge; pt. ambulatory to exit 

## 2018-01-23 NOTE — ED Notes (Signed)
Pt stated that her blood pressure is normally high & takes BP medicine 2 x per day & has her night time dose to take when she gets home.
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
## 2018-02-02 ENCOUNTER — Ambulatory Visit: Payer: Self-pay | Admitting: Obstetrics & Gynecology

## 2018-02-02 ENCOUNTER — Encounter (HOSPITAL_COMMUNITY): Payer: Self-pay

## 2018-02-02 ENCOUNTER — Ambulatory Visit (INDEPENDENT_AMBULATORY_CARE_PROVIDER_SITE_OTHER): Payer: Medicaid Other | Admitting: Obstetrics and Gynecology

## 2018-02-02 ENCOUNTER — Other Ambulatory Visit: Payer: Self-pay

## 2018-02-02 ENCOUNTER — Encounter: Payer: Self-pay | Admitting: Obstetrics and Gynecology

## 2018-02-02 ENCOUNTER — Inpatient Hospital Stay (HOSPITAL_COMMUNITY)
Admission: AD | Admit: 2018-02-02 | Discharge: 2018-02-02 | Disposition: A | Discharge status: Home / Self Care | Payer: Medicaid Other | Source: Ambulatory Visit | Attending: Obstetrics and Gynecology | Admitting: Obstetrics and Gynecology

## 2018-02-02 DIAGNOSIS — F1910 Other psychoactive substance abuse, uncomplicated: Secondary | ICD-10-CM

## 2018-02-02 DIAGNOSIS — Z8619 Personal history of other infectious and parasitic diseases: Secondary | ICD-10-CM | POA: Insufficient documentation

## 2018-02-02 DIAGNOSIS — Z87891 Personal history of nicotine dependence: Secondary | ICD-10-CM | POA: Insufficient documentation

## 2018-02-02 DIAGNOSIS — O119 Pre-existing hypertension with pre-eclampsia, unspecified trimester: Secondary | ICD-10-CM

## 2018-02-02 DIAGNOSIS — Z3009 Encounter for other general counseling and advice on contraception: Secondary | ICD-10-CM

## 2018-02-02 DIAGNOSIS — O115 Pre-existing hypertension with pre-eclampsia, complicating the puerperium: Secondary | ICD-10-CM | POA: Insufficient documentation

## 2018-02-02 DIAGNOSIS — O09522 Supervision of elderly multigravida, second trimester: Secondary | ICD-10-CM

## 2018-02-02 DIAGNOSIS — K5792 Diverticulitis of intestine, part unspecified, without perforation or abscess without bleeding: Secondary | ICD-10-CM | POA: Diagnosis not present

## 2018-02-02 DIAGNOSIS — E039 Hypothyroidism, unspecified: Secondary | ICD-10-CM | POA: Insufficient documentation

## 2018-02-02 DIAGNOSIS — Z1389 Encounter for screening for other disorder: Secondary | ICD-10-CM

## 2018-02-02 DIAGNOSIS — O99280 Endocrine, nutritional and metabolic diseases complicating pregnancy, unspecified trimester: Secondary | ICD-10-CM

## 2018-02-02 DIAGNOSIS — E079 Disorder of thyroid, unspecified: Secondary | ICD-10-CM

## 2018-02-02 DIAGNOSIS — I10 Essential (primary) hypertension: Secondary | ICD-10-CM | POA: Diagnosis present

## 2018-02-02 DIAGNOSIS — O99320 Drug use complicating pregnancy, unspecified trimester: Secondary | ICD-10-CM

## 2018-02-02 DIAGNOSIS — F4321 Adjustment disorder with depressed mood: Secondary | ICD-10-CM

## 2018-02-02 DIAGNOSIS — Z9114 Patient's other noncompliance with medication regimen: Secondary | ICD-10-CM | POA: Diagnosis not present

## 2018-02-02 LAB — CBC
HCT: 37.6 % (ref 36.0–46.0)
Hemoglobin: 11.9 g/dL — ABNORMAL LOW (ref 12.0–15.0)
MCH: 27.6 pg (ref 26.0–34.0)
MCHC: 31.6 g/dL (ref 30.0–36.0)
MCV: 87.2 fL (ref 78.0–100.0)
PLATELETS: 238 10*3/uL (ref 150–400)
RBC: 4.31 MIL/uL (ref 3.87–5.11)
RDW: 14.3 % (ref 11.5–15.5)
WBC: 6.7 10*3/uL (ref 4.0–10.5)

## 2018-02-02 LAB — COMPREHENSIVE METABOLIC PANEL
ALBUMIN: 4 g/dL (ref 3.5–5.0)
ALK PHOS: 58 U/L (ref 38–126)
ALT: 18 U/L (ref 14–54)
AST: 19 U/L (ref 15–41)
Anion gap: 9 (ref 5–15)
BUN: 20 mg/dL (ref 6–20)
CALCIUM: 8.8 mg/dL — AB (ref 8.9–10.3)
CO2: 22 mmol/L (ref 22–32)
CREATININE: 0.78 mg/dL (ref 0.44–1.00)
Chloride: 106 mmol/L (ref 101–111)
GFR calc Af Amer: >60 mL/min (ref >60)
GFR calc non Af Amer: >60 mL/min (ref >60)
GLUCOSE: 97 mg/dL (ref 65–99)
Potassium: 4 mmol/L (ref 3.5–5.1)
SODIUM: 137 mmol/L (ref 135–145)
Total Bilirubin: 0.3 mg/dL (ref 0.3–1.2)
Total Protein: 7.9 g/dL (ref 6.5–8.1)

## 2018-02-02 LAB — PROTEIN / CREATININE RATIO, URINE
CREATININE, URINE: 259 mg/dL
Protein Creatinine Ratio: 0.07 mg/mg{Cre} (ref 0.00–0.15)
TOTAL PROTEIN, URINE: 19 mg/dL

## 2018-02-02 LAB — POCT PREGNANCY, URINE: PREG TEST UR: NEGATIVE

## 2018-02-02 MED ORDER — NIFEDIPINE 10 MG PO CAPS
10.0000 mg | ORAL_CAPSULE | ORAL | Status: DC | PRN
  Administered 2018-02-02: 20 mg via ORAL
  Administered 2018-02-02: 10 mg via ORAL
  Filled 2018-02-02: qty 2
  Filled 2018-02-02: qty 1
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
## 2018-02-02 NOTE — Discharge Instructions (Signed)

## 2018-02-02 NOTE — Progress Notes (Signed)
Subjective:     Susan Walton is a 37 y.o. female who presents for a postpartum visit. She is 7 weeks postpartum following a spontaneous vaginal delivery. I have fully reviewed the prenatal and intrapartum course. The delivery was at 3 gestational weeks. Outcome: spontaneous vaginal delivery. Anesthesia: epidural. Postpartum course has been uncomplicated. Baby's course has been uncomplicated. Baby is feeding by bottle [place]. Bleeding no bleeding. Bowel function is normal. Bladder function is normal. Patient is sexually active. Contraception method is tubal ligation. Postpartum depression screening: negative.  Patient's pregnancy complicated by cHTN w/ SIPE. She has not been taking her BP medication since discharge. Endorses some blurred vision and headache for the last 4 days. Also of note, patient seen in ED postpartum and found to have esophogeal diverticula.   The following portions of the patient's history were reviewed and updated as appropriate: allergies, current medications, past family history, past medical history, past social history, past surgical history and problem list.  Review of Systems Pertinent items are noted in HPI.   Objective:    BP (!) 166/113   Pulse 82   Wt 184 lb 1.6 oz (83.5 kg)   LMP 03/15/2017   BMI 27.99 kg/m   General:  alert, cooperative and no distress   Breasts:  negative  Lungs: Effort and rate normal  Heart:  regular rate and rhythm  Abdomen: soft, non-tender; bowel sounds normal; no masses,  no organomegaly   Pelvic:  not evaluated  Psych: normal mood and affect  Skin:  warm and dry        Assessment:   Routine postpartum exam. Pap smear not done at today's visit.   Plan:  1. Postpartum care and examination Doing well postpartum.   2. Chronic hypertension with superimposed severe preeclampsia Elevated BPs today with symptoms. Not taking BP medication. Had preeclampsia during intrapartum period. To MAU for further treatment and  evaluation.   3. Unwanted fertility PP tubal done  4. Diverticulitis - Ambulatory referral to Gastroenterology  5. Depression. Goes to therapist. Not on medication.   Follow up: as needed.    Luiz Blare, DO [facility], [facility]

## 2018-02-02 NOTE — MAU Note (Signed)
Pt was seen in clinic for routine visit today. BP elevated. Pt states that she "feels normal" . Pt has not taken procardia since delivery.

## 2018-03-09 ENCOUNTER — Encounter: Payer: Self-pay | Admitting: Gastroenterology

## 2018-05-12 ENCOUNTER — Ambulatory Visit (INDEPENDENT_AMBULATORY_CARE_PROVIDER_SITE_OTHER): Payer: Self-pay | Admitting: Gastroenterology

## 2018-05-12 ENCOUNTER — Encounter (INDEPENDENT_AMBULATORY_CARE_PROVIDER_SITE_OTHER): Payer: Self-pay

## 2018-05-12 ENCOUNTER — Encounter: Payer: Self-pay | Admitting: Gastroenterology

## 2018-05-12 VITALS — BP 126/90 | HR 80 | Ht 68.0 in | Wt 194.4 lb

## 2018-05-12 DIAGNOSIS — K219 Gastro-esophageal reflux disease without esophagitis: Secondary | ICD-10-CM

## 2018-05-12 DIAGNOSIS — R1314 Dysphagia, pharyngoesophageal phase: Secondary | ICD-10-CM

## 2018-05-12 DIAGNOSIS — K5909 Other constipation: Secondary | ICD-10-CM

## 2018-05-12 DIAGNOSIS — Q396 Congenital diverticulum of esophagus: Secondary | ICD-10-CM

## 2018-05-12 DIAGNOSIS — K225 Diverticulum of esophagus, acquired: Secondary | ICD-10-CM

## 2018-05-12 MED ORDER — OMEPRAZOLE 40 MG PO CPDR: 40.0000 mg | DELAYED_RELEASE_CAPSULE | Freq: Every day | ORAL | 3 refills | Status: DC

## 2018-05-12 NOTE — Patient Instructions (Addendum)
You have been scheduled for a Barium Esophogram at [facility] (1st floor of the hospital) on 05/25/2018 at 9:30am. Please arrive 15 minutes prior to your appointment for registration. Make certain not to have anything to eat or drink 3 hours prior to your test. If you need to reschedule for any reason, please contact radiology at [phone] to do so. __________________________________________________________________ A barium swallow is an examination that concentrates on views of the esophagus. This tends to be a double contrast exam (barium and two liquids which, when combined, create a gas to distend the wall of the oesophagus) or single contrast (non-ionic iodine based). The study is usually tailored to your symptoms so a good history is essential. Attention is paid during the study to the form, structure and configuration of the esophagus, looking for functional disorders (such as aspiration, dysphagia, achalasia, motility and reflux) EXAMINATION You may be asked to change into a gown, depending on the type of swallow being performed. A radiologist and radiographer will perform the procedure. The radiologist will advise you of the type of contrast selected for your procedure and direct you during the exam. You will be asked to stand, sit or lie in several different positions and to hold a small amount of fluid in your mouth before being asked to swallow while the imaging is performed .In some instances you may be asked to swallow barium coated marshmallows to assess the motility of a solid food bolus. The exam can be recorded as a digital or video fluoroscopy procedure. POST PROCEDURE It will take 1-2 days for the barium to pass through your system. To facilitate this, it is important, unless otherwise directed, to increase your fluids for the next 24-48hrs and to resume your normal diet.  This test typically takes about 30 minutes to  perform. __________________________________________________________________________________   We have sent omeprazole to your pharmacy  Take miralax 1/2 capful daily  Increase fluid intake and fiber   Constipation, Adult Constipation is when a person:  Poops (has a bowel movement) fewer times in a week than normal.  Has a hard time pooping.  Has poop that is dry, hard, or bigger than normal.  Follow these instructions at home: Eating and drinking   Eat foods that have a lot of fiber, such as: ? Fresh fruits and vegetables. ? Whole grains. ? Beans.  Eat less of foods that are high in fat, low in fiber, or overly processed, such as: ? [place] fries. ? Hamburgers. ? Cookies. ? Candy. ? Soda.  Drink enough fluid to keep your pee (urine) clear or pale yellow. General instructions  Exercise regularly or as told by your doctor.  Go to the restroom when you feel like you need to poop. Do not hold it in.  Take over-the-counter and prescription medicines only as told by your doctor. These include any fiber supplements.  Do pelvic floor retraining exercises, such as: ? Doing deep breathing while relaxing your lower belly (abdomen). ? Relaxing your pelvic floor while pooping.  Watch your condition for any changes.  Keep all follow-up visits as told by your doctor. This is important. Contact a doctor if:  You have pain that gets worse.  You have a fever.  You have not pooped for 4 days.  You throw up (vomit).  You are not hungry.  You lose weight.  You are bleeding from the anus.  You have thin, pencil-like poop (stool). Get help right away if:  You have a fever, and your  symptoms suddenly get worse.  You leak poop or have blood in your poop.  Your belly feels hard or bigger than normal (is bloated).  You have very bad belly pain.  You feel dizzy or you faint. This information is not intended to replace advice given to you by your health care  provider. Make sure you discuss any questions you have with your health care provider. Document Released: 02/18/2008 Document Revised: 03/21/2016 Document Reviewed: 02/20/2016 Elsevier Interactive Patient Education  2018 [place].   Gastroesophageal Reflux Disease, Adult Normally, food travels down the esophagus and stays in the stomach to be digested. However, when a person has gastroesophageal reflux disease (GERD), food and stomach acid move back up into the esophagus. When this happens, the esophagus becomes sore and inflamed. Over time, GERD can create small holes (ulcers) in the lining of the esophagus. What are the causes? This condition is caused by a problem with the muscle between the esophagus and the stomach (lower esophageal sphincter, or LES). Normally, the LES muscle closes after food passes through the esophagus to the stomach. When the LES is weakened or abnormal, it does not close properly, and that allows food and stomach acid to go back up into the esophagus. The LES can be weakened by certain dietary substances, medicines, and medical conditions, including:  Tobacco use.  Pregnancy.  Having a hiatal hernia.  Heavy alcohol use.  Certain foods and beverages, such as coffee, chocolate, onions, and peppermint.  What increases the risk? This condition is more likely to develop in:  People who have an increased body weight.  People who have connective tissue disorders.  People who use NSAID medicines.  What are the signs or symptoms? Symptoms of this condition include:  Heartburn.  Difficult or painful swallowing.  The feeling of having a lump in the throat.  Abitter taste in the mouth.  Bad breath.  Having a large amount of saliva.  Having an upset or bloated stomach.  Belching.  Chest pain.  Shortness of breath or wheezing.  Ongoing (chronic) cough or a night-time cough.  Wearing away of tooth enamel.  Weight loss.  Different conditions  can cause chest pain. Make sure to see your health care provider if you experience chest pain. How is this diagnosed? Your health care provider will take a medical history and perform a physical exam. To determine if you have mild or severe GERD, your health care provider may also monitor how you respond to treatment. You may also have other tests, including:  An endoscopy toexamine your stomach and esophagus with a small camera.  A test thatmeasures the acidity level in your esophagus.  A test thatmeasures how much pressure is on your esophagus.  A barium swallow or modified barium swallow to show the shape, size, and functioning of your esophagus.  How is this treated? The goal of treatment is to help relieve your symptoms and to prevent complications. Treatment for this condition may vary depending on how severe your symptoms are. Your health care provider may recommend:  Changes to your diet.  Medicine.  Surgery.  Follow these instructions at home: Diet  Follow a diet as recommended by your health care provider. This may involve avoiding foods and drinks such as: ? Coffee and tea (with or without caffeine). ? Drinks that containalcohol. ? Energy drinks and sports drinks. ? Carbonated drinks or sodas. ? Chocolate and cocoa. ? Peppermint and mint flavorings. ? Garlic and onions. ? Horseradish. ? Spicy and  acidic foods, including peppers, chili powder, curry powder, vinegar, hot sauces, and barbecue sauce. ? Citrus fruit juices and citrus fruits, such as oranges, lemons, and limes. ? Tomato-based foods, such as red sauce, chili, salsa, and pizza with red sauce. ? Fried and fatty foods, such as donuts, french fries, potato chips, and high-fat dressings. ? High-fat meats, such as hot dogs and fatty cuts of red and white meats, such as rib eye steak, sausage, ham, and bacon. ? High-fat dairy items, such as whole milk, butter, and cream cheese.  Eat small, frequent meals  instead of large meals.  Avoid drinking large amounts of liquid with your meals.  Avoid eating meals during the 2-3 hours before bedtime.  Avoid lying down right after you eat.  Do not exercise right after you eat. General instructions  Pay attention to any changes in your symptoms.  Take over-the-counter and prescription medicines only as told by your health care provider. Do not take aspirin, ibuprofen, or other NSAIDs unless your health care provider told you to do so.  Do not use any tobacco products, including cigarettes, chewing tobacco, and e-cigarettes. If you need help quitting, ask your health care provider.  Wear loose-fitting clothing. Do not wear anything tight around your waist that causes pressure on your abdomen.  Raise (elevate) the head of your bed 6 inches (15cm).  Try to reduce your stress, such as with yoga or meditation. If you need help reducing stress, ask your health care provider.  If you are overweight, reduce your weight to an amount that is healthy for you. Ask your health care provider for guidance about a safe weight loss goal.  Keep all follow-up visits as told by your health care provider. This is important. Contact a health care provider if:  You have new symptoms.  You have unexplained weight loss.  You have difficulty swallowing, or it hurts to swallow.  You have wheezing or a persistent cough.  Your symptoms do not improve with treatment.  You have a hoarse voice. Get help right away if:  You have pain in your arms, neck, jaw, teeth, or back.  You feel sweaty, dizzy, or light-headed.  You have chest pain or shortness of breath.  You vomit and your vomit looks like blood or coffee grounds.  You faint.  Your stool is bloody or black.  You cannot swallow, drink, or eat. This information is not intended to replace advice given to you by your health care provider. Make sure you discuss any questions you have with your health care  provider. Document Released: 06/11/2005 Document Revised: 01/30/2016 Document Reviewed: 12/27/2014 Elsevier Interactive Patient Education  2018 [place] for Gastroesophageal Reflux Disease, Adult When you have gastroesophageal reflux disease (GERD), the foods you eat and your eating habits are very important. Choosing the right foods can help ease your discomfort. What guidelines do I need to follow?  Choose fruits, vegetables, whole grains, and low-fat dairy products.  Choose low-fat meat, fish, and poultry.  Limit fats such as oils, salad dressings, butter, nuts, and avocado.  Keep a food diary. This helps you identify foods that cause symptoms.  Avoid foods that cause symptoms. These may be different for everyone.  Eat small meals often instead of 3 large meals a day.  Eat your meals slowly, in a place where you are relaxed.  Limit fried foods.  Cook foods using methods other than frying.  Avoid drinking alcohol.  Avoid drinking large  amounts of liquids with your meals.  Avoid bending over or lying down until 2-3 hours after eating. What foods are not recommended? These are some foods and drinks that may make your symptoms worse: Vegetables Tomatoes. Tomato juice. Tomato and spaghetti sauce. Chili peppers. Onion and garlic. Horseradish. Fruits Oranges, grapefruit, and lemon (fruit and juice). Meats High-fat meats, fish, and poultry. This includes hot dogs, ribs, ham, sausage, salami, and bacon. Dairy Whole milk and chocolate milk. Sour cream. Cream. Butter. Ice cream. Cream cheese. Drinks Coffee and tea. Bubbly (carbonated) drinks or energy drinks. Condiments Hot sauce. Barbecue sauce. Sweets/Desserts Chocolate and cocoa. Donuts. Peppermint and spearmint. Fats and Oils High-fat foods. This includes [place] fries and potato chips. Other Vinegar. Strong spices. This includes black pepper, white pepper, red pepper, cayenne, curry powder,  cloves, ginger, and chili powder. The items listed above may not be a complete list of foods and drinks to avoid. Contact your dietitian for more information. This information is not intended to replace advice given to you by your health care provider. Make sure you discuss any questions you have with your health care provider. Document Released: 03/02/2012 Document Revised: 02/07/2016 Document Reviewed: 07/06/2013 Elsevier Interactive Patient Education  2017 [place] you for choosing [facility]  Kavitha Nandigam,MD
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
## 2018-05-14 ENCOUNTER — Encounter: Payer: Self-pay | Admitting: Gastroenterology

## 2018-05-22 ENCOUNTER — Encounter (HOSPITAL_COMMUNITY): Payer: Self-pay | Admitting: *Deleted

## 2018-05-22 ENCOUNTER — Other Ambulatory Visit: Payer: Self-pay

## 2018-05-22 ENCOUNTER — Ambulatory Visit (HOSPITAL_COMMUNITY)
Admission: EM | Admit: 2018-05-22 | Discharge: 2018-05-22 | Disposition: A | Discharge status: Home / Self Care | Payer: Medicaid Other | Attending: Internal Medicine | Admitting: Internal Medicine

## 2018-05-22 DIAGNOSIS — Z87891 Personal history of nicotine dependence: Secondary | ICD-10-CM | POA: Insufficient documentation

## 2018-05-22 DIAGNOSIS — Z79899 Other long term (current) drug therapy: Secondary | ICD-10-CM | POA: Insufficient documentation

## 2018-05-22 DIAGNOSIS — D259 Leiomyoma of uterus, unspecified: Secondary | ICD-10-CM | POA: Insufficient documentation

## 2018-05-22 DIAGNOSIS — J069 Acute upper respiratory infection, unspecified: Secondary | ICD-10-CM | POA: Diagnosis not present

## 2018-05-22 DIAGNOSIS — D649 Anemia, unspecified: Secondary | ICD-10-CM | POA: Insufficient documentation

## 2018-05-22 DIAGNOSIS — J029 Acute pharyngitis, unspecified: Secondary | ICD-10-CM | POA: Insufficient documentation

## 2018-05-22 DIAGNOSIS — I10 Essential (primary) hypertension: Secondary | ICD-10-CM | POA: Insufficient documentation

## 2018-05-22 DIAGNOSIS — H9201 Otalgia, right ear: Secondary | ICD-10-CM | POA: Insufficient documentation

## 2018-05-22 DIAGNOSIS — E039 Hypothyroidism, unspecified: Secondary | ICD-10-CM | POA: Insufficient documentation

## 2018-05-22 DIAGNOSIS — Z202 Contact with and (suspected) exposure to infections with a predominantly sexual mode of transmission: Secondary | ICD-10-CM | POA: Insufficient documentation

## 2018-05-22 MED ORDER — CEFTRIAXONE SODIUM 250 MG IJ SOLR
250.0000 mg | Freq: Once | INTRAMUSCULAR | Status: AC
  Administered 2018-05-22: 250 mg via INTRAMUSCULAR

## 2018-05-22 MED ORDER — AZITHROMYCIN 250 MG PO TABS
ORAL_TABLET | ORAL | Status: AC
  Filled 2018-05-22: qty 4

## 2018-05-22 MED ORDER — FLUTICASONE PROPIONATE 50 MCG/ACT NA SUSP: 1.0000 | Freq: Every day | NASAL | 2 refills | Status: DC

## 2018-05-22 MED ORDER — AZITHROMYCIN 250 MG PO TABS
1000.0000 mg | ORAL_TABLET | Freq: Once | ORAL | Status: AC
  Administered 2018-05-22: 1000 mg via ORAL

## 2018-05-22 MED ORDER — IPRATROPIUM BROMIDE 0.06 % NA SOLN: 2.0000 | Freq: Three times a day (TID) | NASAL | 12 refills | Status: DC

## 2018-05-22 MED ORDER — CEFTRIAXONE SODIUM 250 MG IJ SOLR
INTRAMUSCULAR | Status: AC
  Filled 2018-05-22: qty 250

## 2018-05-22 MED ORDER — DM-GUAIFENESIN ER 30-600 MG PO TB12: 2.0000 | ORAL_TABLET | Freq: Two times a day (BID) | ORAL | 0 refills | Status: DC

## 2018-05-22 MED ORDER — LIDOCAINE HCL (PF) 1 % IJ SOLN
INTRAMUSCULAR | Status: AC
  Filled 2018-05-22: qty 2
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
## 2018-05-22 NOTE — ED Triage Notes (Addendum)
Pt states she started having abdominal pain and yellow discharge yesterday and would like to be tested for Gonorhea. She also states she started having respiratory symptoms 3 days ago. Pt states s/s of nasal congestion, nasal drainage, sore throat, coughing, and right ear pain.

## 2018-05-22 NOTE — Discharge Instructions (Signed)
Push fluids to ensure adequate hydration and keep secretions thin.  Tylenol and/or ibuprofen as needed for pain or fevers.  Daily flonase.  Atrovent nasal spray 3-4 times a day to help with congestion.  Mucinex d as expectorant and to help with cough.  Please continue with daily claritin.  We have treated you today for gonorrhea and chlamydia. Will notify you of any positive findings and if any changes to treatment are needed.   Please withhold from intercourse for the next week. Please use condoms to prevent STD's.

## 2018-05-24 LAB — CERVICOVAGINAL ANCILLARY ONLY
BACTERIAL VAGINITIS: NEGATIVE
CANDIDA VAGINITIS: NEGATIVE
Chlamydia: NEGATIVE
Neisseria Gonorrhea: POSITIVE — AB
TRICH (WINDOWPATH): POSITIVE — AB

## 2018-05-25 ENCOUNTER — Ambulatory Visit (HOSPITAL_COMMUNITY)
Admission: RE | Admit: 2018-05-25 | Discharge: 2018-05-25 | Disposition: A | Discharge status: Home / Self Care | Payer: Medicaid Other | Source: Ambulatory Visit | Attending: Gastroenterology | Admitting: Gastroenterology

## 2018-05-25 ENCOUNTER — Telehealth (HOSPITAL_COMMUNITY): Payer: Self-pay

## 2018-05-25 ENCOUNTER — Encounter (HOSPITAL_COMMUNITY): Payer: Self-pay

## 2018-05-25 DIAGNOSIS — Q396 Congenital diverticulum of esophagus: Secondary | ICD-10-CM

## 2018-05-25 DIAGNOSIS — K225 Diverticulum of esophagus, acquired: Secondary | ICD-10-CM

## 2018-05-25 MED ORDER — METRONIDAZOLE 500 MG PO TABS: 2000.0000 mg | ORAL_TABLET | Freq: Once | ORAL | 0 refills | Status: AC

## 2018-05-25 NOTE — Telephone Encounter (Signed)
Test for gonorrhea was positive. This was treated at the urgent care visit with IM rocephin 250mg  and po zithromax 1g. Pt called regarding test results, instructed patient to refrain from sexual intercourse for 7 days after treatment to give the medicine time to work. Sexual partners need to be notified and tested/treated. Condoms may reduce risk of reinfection. Recheck or followup with PCP for further evaluation if symptoms are not improving. Answered all patient questions. [facility] notified.   Trichomonas is positive. Rx metronidazole 500mg  bid x 7d #14 no refills was sent to the pharmacy of record. PT called and made aware.  Educated patient to refrain from sexual intercourse for 7 days to give the medicine time to work. Sexual partners need to be notified and tested/treated. Condoms may reduce risk of reinfection.  Recheck for further evaluation if symptoms are not improving. Pt verbalized understanding.

## 2018-06-08 ENCOUNTER — Ambulatory Visit (HOSPITAL_COMMUNITY): Admission: RE | Admit: 2018-06-08 | Payer: No Typology Code available for payment source | Source: Ambulatory Visit

## 2018-06-09 ENCOUNTER — Ambulatory Visit (HOSPITAL_COMMUNITY): Payer: Self-pay

## 2018-06-09 ENCOUNTER — Encounter (HOSPITAL_COMMUNITY): Payer: Self-pay

## 2018-07-01 ENCOUNTER — Ambulatory Visit (HOSPITAL_COMMUNITY): Payer: No Typology Code available for payment source

## 2018-09-11 ENCOUNTER — Ambulatory Visit (HOSPITAL_COMMUNITY)
Admission: EM | Admit: 2018-09-11 | Discharge: 2018-09-11 | Disposition: A | Discharge status: Home / Self Care | Payer: Self-pay | Attending: Physician Assistant | Admitting: Physician Assistant

## 2018-09-11 DIAGNOSIS — J069 Acute upper respiratory infection, unspecified: Secondary | ICD-10-CM | POA: Insufficient documentation

## 2018-09-11 DIAGNOSIS — B9789 Other viral agents as the cause of diseases classified elsewhere: Secondary | ICD-10-CM | POA: Insufficient documentation

## 2018-09-11 MED ORDER — BENZONATATE 100 MG PO CAPS: 100.0000 mg | ORAL_CAPSULE | Freq: Three times a day (TID) | ORAL | 0 refills | Status: DC

## 2018-09-11 MED ORDER — IBUPROFEN 800 MG PO TABS: 800.0000 mg | ORAL_TABLET | Freq: Three times a day (TID) | ORAL | 0 refills | Status: DC

## 2018-09-11 NOTE — ED Provider Notes (Signed)
09/11/2018 6:23 PM   DOB: [birth year] / MRN: [id number]  SUBJECTIVE:  Susan Walton is a 37 y.o. female presenting for cough, nasal congestion, and laryngitis. This has been worsening.  No fever.   She has No Known Allergies.   She  has a past medical history of Anemia, Fibroid, Hypertension, Retinal detachment, and Thyroid disease.    She  reports that she has quit smoking. Her smoking use included cigarettes. She smoked 0.50 packs per day. She has never used smokeless tobacco. She reports previous alcohol use. She reports that she does not use drugs. She  reports being sexually active. She reports using the following method of birth control/protection: None. The patient  has a past surgical history that includes Dilation and curettage of uterus; Eye surgery; and Tubal ligation (N/A, 12/11/2017).  Her family history includes COPD in her maternal grandfather; Liver disease in her paternal grandfather and paternal grandmother.  Review of Systems  Constitutional: Negative for chills.  Eyes: Negative.   Respiratory: Negative.   Cardiovascular: Negative.   Gastrointestinal: Negative.   Genitourinary: Negative.   Musculoskeletal: Negative.   Skin: Negative.   Neurological: Negative.     OBJECTIVE:  There were no vitals taken for this visit.  Wt Readings from Last 3 Encounters:  05/12/18 194 lb 6.4 oz (88.2 kg)  02/02/18 184 lb 1.6 oz (83.5 kg)  12/10/17 198 lb 6.4 oz (90 kg)   Temp Readings from Last 3 Encounters:  05/22/18 98.3 F (36.8 C) (Oral)  02/02/18 98.4 F (36.9 C)  01/23/18 98.7 F (37.1 C)   BP Readings from Last 3 Encounters:  05/22/18 (!) 158/115  05/12/18 126/90  02/02/18 (!) 172/108   Pulse Readings from Last 3 Encounters:  05/22/18 79  05/12/18 80  02/02/18 88    Physical Exam Vitals signs reviewed.  Constitutional:      General: She is not in acute distress.    Appearance: She is well-developed. She is not diaphoretic.  Eyes:     Pupils: Pupils are  equal, round, and reactive to light.  Cardiovascular:     Rate and Rhythm: Normal rate.  Pulmonary:     Effort: Pulmonary effort is normal.  Abdominal:     General: There is no distension.  Musculoskeletal: Normal range of motion.  Skin:    General: Skin is warm and dry.  Neurological:     Mental Status: She is alert and oriented to person, place, and time.     Cranial Nerves: No cranial nerve deficit.     Gait: Gait normal.     No results found for this or any previous visit (from the past 72 hour(s)).  No results found.  ASSESSMENT AND PLAN:   Viral URI with cough  Discharge Instructions   None        The patient is advised to call or return to clinic if she does not see an improvement in symptoms, or to seek the care of the closest emergency department if she worsens with the above plan.   Philis Fendt, MHS, PA-C 09/11/2018 6:23 PM   Tereasa Coop, PA-C 09/11/18 1824

## 2018-09-11 NOTE — Discharge Instructions (Addendum)
I have sent meds to the pharmacy for your cold.

## 2018-09-11 NOTE — ED Notes (Signed)
Patient was seen and discharged by provider prior to vital signs and assessment

## 2019-03-06 ENCOUNTER — Emergency Department (HOSPITAL_COMMUNITY): Payer: Medicaid Other

## 2019-03-06 ENCOUNTER — Emergency Department (HOSPITAL_COMMUNITY)
Admission: EM | Admit: 2019-03-06 | Discharge: 2019-03-06 | Disposition: A | Discharge status: Home / Self Care | Payer: Medicaid Other | Attending: Emergency Medicine | Admitting: Emergency Medicine

## 2019-03-06 ENCOUNTER — Encounter (HOSPITAL_COMMUNITY): Payer: Self-pay | Admitting: Emergency Medicine

## 2019-03-06 ENCOUNTER — Other Ambulatory Visit: Payer: Self-pay

## 2019-03-06 DIAGNOSIS — R509 Fever, unspecified: Secondary | ICD-10-CM | POA: Insufficient documentation

## 2019-03-06 DIAGNOSIS — K0889 Other specified disorders of teeth and supporting structures: Secondary | ICD-10-CM

## 2019-03-06 DIAGNOSIS — K029 Dental caries, unspecified: Secondary | ICD-10-CM | POA: Diagnosis not present

## 2019-03-06 DIAGNOSIS — Z79899 Other long term (current) drug therapy: Secondary | ICD-10-CM | POA: Diagnosis not present

## 2019-03-06 DIAGNOSIS — Z20828 Contact with and (suspected) exposure to other viral communicable diseases: Secondary | ICD-10-CM | POA: Insufficient documentation

## 2019-03-06 DIAGNOSIS — Z87891 Personal history of nicotine dependence: Secondary | ICD-10-CM | POA: Diagnosis not present

## 2019-03-06 DIAGNOSIS — I10 Essential (primary) hypertension: Secondary | ICD-10-CM | POA: Insufficient documentation

## 2019-03-06 MED ORDER — AMOXICILLIN-POT CLAVULANATE 875-125 MG PO TABS: 1.0000 | ORAL_TABLET | Freq: Two times a day (BID) | ORAL | 0 refills | Status: DC

## 2019-03-06 MED ORDER — LABETALOL HCL 100 MG PO TABS: 100.0000 mg | ORAL_TABLET | Freq: Two times a day (BID) | ORAL | 0 refills | Status: DC

## 2019-03-06 NOTE — ED Notes (Signed)
Pt stated she took 1g of Tylenol at 2000 yesterday. Woke up this am and took another 1g dose of Tylenol. She began to have chest pains and not feel good. Pt then took 600 mg of ibuprofen and chest pain went away. Pt states she feels great.

## 2019-03-06 NOTE — ED Triage Notes (Signed)
Per [facility], Pt from home with complaints of dental pain, SHOB, and body aches. Pt reports chills, worsening body aches and high blood pressure since this morning. Hx of HTN, not taking medication in over 1 year due to financial reasons. Pt recently traveled to [place]. Pt reports a filling fell out of her L bottom molar and she has had pain difficulty chewing since. Pt reports sore throat and cough, unsure of sick contact.

## 2019-03-07 NOTE — ED Provider Notes (Addendum)
[facility] EMERGENCY DEPARTMENT Provider Note   CSN: [id number] Arrival date & time: 03/06/19  1651     History   Chief Complaint Chief Complaint  Patient presents with  . Hypertension  . Dental Pain    HPI Susan Walton is a 38 y.o. female.     HPI  38 year old female with a history of hypertension presents with concern for dental pain, sore throat, cough and body aches.  Reports a feeling fell out of her left bottom molar and she is had difficulty chewing since then, and the pain is getting worse.  Reports that pain worsened yesterday.  Not helped by Tylenol, but she took ibuprofen prior to arrival which helped her pain and she feels much better.  She also reports symptoms of body aches, chills, shortness of breath.  She was not aware that she had a fever at home, but did have a temperature of 101.2 on arrival to the emergency department.  She also reports sore throat and mild cough.  Mild congestion.  Denies nausea, vomiting, diarrhea.  Does chills she has a loss of smell and taste.  She has been off of her blood pressure medications for 1 year, but just today had taken labetalol that have been previously prescribed.  Reports she had a previous episode of chest pain in association with her other symptoms that resolved.  Past Medical History:  Diagnosis Date  . Anemia   . Fibroid   . Hypertension   . Retinal detachment   . Thyroid disease    Hypothyroidism    Patient Active Problem List   Diagnosis Date Noted  . Group B Streptococcus carrier, +RV culture, currently pregnant 12/10/2017  . Gonorrhea affecting pregnancy in first trimester 12/10/2017  . 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 Pregnancy, supervision, high-risk 07/27/2017  . Chronic hypertension with superimposed severe preeclampsia 07/27/2017  . 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 Thyroid dysfunction, antepartum 07/27/2017  . History of  trichomoniasis 07/27/2017  . Unwanted fertility 07/27/2017  . Advanced maternal age in multigravida, second trimester   . Adjustment disorder with depressed mood 06/05/2017    Past Surgical History:  Procedure Laterality Date  . DILATION AND CURETTAGE OF UTERUS    . EYE SURGERY    . TUBAL LIGATION N/A 12/11/2017   Procedure: POST PARTUM TUBAL LIGATION;  Surgeon: [name], MD;  Location: [facility];  Service: Gynecology;  Laterality: N/A;     OB History    Gravida  8   Para  5   Term  5   Preterm  0   AB  3   Living  5     SAB  2   TAB  1   Ectopic  0   Multiple  0   Live Births  5            Home Medications    Prior to Admission medications   Medication Sig Start Date End Date Taking? Authorizing Provider  amoxicillin-clavulanate (AUGMENTIN) 875-125 MG tablet Take 1 tablet by mouth every 12 (twelve) hours. 03/06/19   Gareth Morgan, MD  benzonatate (TESSALON) 100 MG capsule Take 1 capsule (100 mg total) by mouth every 8 (eight) hours. 09/11/18   Tereasa Coop, PA-C  ibuprofen (ADVIL,MOTRIN) 800 MG tablet Take 1 tablet (800 mg total) by mouth 3 (three) times daily. 09/11/18   Tereasa Coop, PA-C  labetalol (NORMODYNE) 100 MG tablet Take 1 tablet (100  mg total) by mouth 2 (two) times daily. 03/06/19   Gareth Morgan, MD  NIFEdipine (PROCARDIA-XL/ADALAT CC) 60 MG 24 hr tablet Take 1 tablet (60 mg total) by mouth 2 (two) times daily. 12/13/17   Anyanwu, Sallyanne Havers, MD  omeprazole (PRILOSEC) 40 MG capsule Take 1 capsule (40 mg total) by mouth daily. 30 minutes before breakfast 05/12/18   Mauri Pole, MD    Family History Family History  Problem Relation Age of Onset  . COPD Maternal Grandfather   . Liver disease Paternal Grandmother   . Liver disease Paternal Grandfather     Social History Social History   Tobacco Use  . Smoking status: Former Smoker    Packs/day: 0.50    Types: Cigarettes  . Smokeless tobacco: Never Used   Substance Use Topics  . Alcohol use: Not Currently    Comment: quit mth after +UPT  . Drug use: No    Types: Marijuana, Cocaine    Comment: quit 1 mth after +UPT     Allergies   Patient has no known allergies.   Review of Systems Review of Systems  Constitutional: Positive for appetite change, chills and fever.  HENT: Positive for dental problem.   Respiratory: Positive for cough and shortness of breath.   Cardiovascular: Positive for chest pain.  Gastrointestinal: Negative for abdominal pain, constipation, nausea and rectal pain.  Genitourinary: Negative for dysuria.  Skin: Negative for rash.  Neurological: Positive for headaches.     Physical Exam Updated Vital Signs BP (!) 157/100 (BP Location: Right Arm)   Pulse 82   Temp (!) 101.2 F (38.4 C) (Oral)   Resp 16   LMP 03/05/2019   SpO2 99%   Physical Exam Vitals signs and nursing note reviewed.  Constitutional:      General: She is not in acute distress.    Appearance: She is well-developed. She is not diaphoretic.  HENT:     Head: Normocephalic and atraumatic.     Mouth/Throat:     Comments: Dental caries No abscess Eyes:     Conjunctiva/sclera: Conjunctivae normal.  Neck:     Musculoskeletal: Normal range of motion.  Cardiovascular:     Rate and Rhythm: Normal rate and regular rhythm.  Pulmonary:     Effort: Pulmonary effort is normal. No respiratory distress.  Musculoskeletal:        General: No tenderness.  Skin:    General: Skin is warm and dry.     Findings: No erythema or rash.  Neurological:     Mental Status: She is alert and oriented to person, place, and time.      ED Treatments / Results  Labs (all labs ordered are listed, but only abnormal results are displayed) Labs Reviewed  NOVEL CORONAVIRUS, [facility] ORDER, SEND-OUT TO REF LAB)    EKG EKG Interpretation  Date/Time:  Sunday March 06 2019 17:05:38 EDT Ventricular Rate:  88 PR Interval:  148 QRS Duration: 82 QT  Interval:  382 QTC Calculation: 462 R Axis:   44 Text Interpretation:  Normal sinus rhythm Normal ECG No significant change since last tracing Confirmed by Gareth Morgan [phone]) on 03/06/2019 6:48:30 PM   Radiology Dg Chest 2 View  Result Date: 03/06/2019 CLINICAL DATA:  Acute chest pain and shortness of breath EXAM: CHEST - 2 VIEW COMPARISON:  01/22/2018 FINDINGS: Mild cardiomegaly again noted. There is no evidence of focal airspace disease, pulmonary edema, suspicious pulmonary nodule/mass, pleural effusion, or pneumothorax. No acute bony abnormalities are  identified. IMPRESSION: Mild cardiomegaly without evidence of acute cardiopulmonary disease. Electronically Signed   By: Margarette Canada M.D.   On: 03/06/2019 18:07    Procedures Procedures (including critical care time)  Medications Ordered in ED Medications - No data to display   Initial Impression / Assessment and Plan / ED Course  I have reviewed the triage vital signs and the nursing notes.  Pertinent labs & imaging results that were available during my care of the patient were reviewed by me and considered in my medical decision making (see chart for details).        38 year old female with a history of hypertension presents with concern for dental pain, sore throat, cough and body aches.  Regarding dental pain, patient has caries, but no sign of drainable dental abscess.  We will treat for suspected dental infection with Augmentin for 7 days.  While this may also be an etiology of her fever, she has other symptoms which are concerning for COVID-19.  Chest x-ray was done and showed no evidence of pneumonia.  EKG shows no significant findings.  Discussed my suspicion for possible coronavirus and recommended self quarantine.  Since test which will be complete in 24 to 48 hours.  Recommend continued supportive care.  Consulted case management to help patient find primary care physician as well as dental resources.  Recommend  following up with dentist once her period of self quarantine is over. Gave rx for labetalol for bp.  Susan Walton was evaluated in Emergency Department on 03/07/2019 for the symptoms described in the history of present illness. She was evaluated in the context of the global COVID-19 pandemic, which necessitated consideration that the patient might be at risk for infection with the SARS-CoV-2 virus that causes COVID-19. Institutional protocols and algorithms that pertain to the evaluation of patients at risk for COVID-19 are in a state of rapid change based on information released by regulatory bodies including the CDC and federal and state organizations. These policies and algorithms were followed during the patient's care in the ED.   Final Clinical Impressions(s) / ED Diagnoses   Final diagnoses:  Pain, dental  Dental caries  Fever, unspecified fever cause    ED Discharge Orders         Ordered    labetalol (NORMODYNE) 100 MG tablet  2 times daily     03/06/19 1855    amoxicillin-clavulanate (AUGMENTIN) 875-125 MG tablet  Every 12 hours     03/06/19 1855           Gareth Morgan, MD 03/07/19 1443    Gareth Morgan, MD 04/18/19 2249

## 2019-03-08 LAB — NOVEL CORONAVIRUS, NAA (HOSP ORDER, SEND-OUT TO REF LAB; TAT 18-24 HRS): SARS-CoV-2, NAA: NOT DETECTED

## 2019-03-14 ENCOUNTER — Telehealth: Payer: Self-pay

## 2019-03-14 NOTE — Telephone Encounter (Signed)
Message received from Laurena Slimmer, RN CM requesting a hospital follow up appointment for patient at [facility].   Attempted to contact patient # [id number] and # [phone].  Message left at both numbers requesting a call back to this CM # [phone].  The patient also has the phone number for [facility] on her ED AVS  Update provided to W. Roigers, RN CM

## 2019-03-22 NOTE — Progress Notes (Deleted)
Patient ID: Susan Walton, female   DOB: 07-13-1981, 38 y.o.   MRN: [id number]  After being seen in the ED 03/06/2019.  From A/P: 38 year old female with a history of hypertension presents with concern for dental pain, sore throat, cough and body aches.  Regarding dental pain, patient has caries, but no sign of drainable dental abscess.  We will treat for suspected dental infection with Augmentin for 7 days.  While this may also be an etiology of her fever, she has other symptoms which are concerning for COVID-19.  Chest x-ray was done and showed no evidence of pneumonia.  EKG shows no significant findings.  Discussed my suspicion for possible coronavirus and recommended self quarantine.  Since test which will be complete in 24 to 48 hours.  Recommend continued supportive care.  Consulted case management to help patient find primary care physician as well as dental resources.  Recommend following up with dentist once her period of self quarantine is over. Gave rx for labetalol for bp.  [name] was evaluated in Emergency Department on 03/07/2019 for the symptoms described in the history of present illness. She was evaluated in the context of the global COVID-19 pandemic, which necessitated consideration that the patient might be at risk for infection with the SARS-CoV-2 virus that causes COVID-19. Institutional protocols and algorithms that pertain to the evaluation of patients at risk for COVID-19 are in a state of rapid change based on information released by regulatory bodies including the CDC and federal and state organizations. These policies and algorithms were followed during the patient's care in the ED.

## 2019-03-23 ENCOUNTER — Other Ambulatory Visit: Payer: Self-pay

## 2019-03-23 ENCOUNTER — Ambulatory Visit: Payer: Self-pay | Attending: Family Medicine

## 2019-03-28 ENCOUNTER — Emergency Department (HOSPITAL_COMMUNITY): Payer: Medicaid Other

## 2019-03-28 ENCOUNTER — Encounter (HOSPITAL_COMMUNITY): Payer: Self-pay | Admitting: *Deleted

## 2019-03-28 ENCOUNTER — Other Ambulatory Visit: Payer: Self-pay

## 2019-03-28 ENCOUNTER — Emergency Department (HOSPITAL_COMMUNITY)
Admission: EM | Admit: 2019-03-28 | Discharge: 2019-03-28 | Disposition: A | Discharge status: Home / Self Care | Payer: Medicaid Other | Attending: Emergency Medicine | Admitting: Emergency Medicine

## 2019-03-28 DIAGNOSIS — Y998 Other external cause status: Secondary | ICD-10-CM | POA: Insufficient documentation

## 2019-03-28 DIAGNOSIS — Y9389 Activity, other specified: Secondary | ICD-10-CM | POA: Diagnosis not present

## 2019-03-28 DIAGNOSIS — I10 Essential (primary) hypertension: Secondary | ICD-10-CM | POA: Diagnosis not present

## 2019-03-28 DIAGNOSIS — Y92019 Unspecified place in single-family (private) house as the place of occurrence of the external cause: Secondary | ICD-10-CM | POA: Diagnosis not present

## 2019-03-28 DIAGNOSIS — R0789 Other chest pain: Secondary | ICD-10-CM | POA: Insufficient documentation

## 2019-03-28 DIAGNOSIS — E039 Hypothyroidism, unspecified: Secondary | ICD-10-CM | POA: Insufficient documentation

## 2019-03-28 DIAGNOSIS — F419 Anxiety disorder, unspecified: Secondary | ICD-10-CM | POA: Insufficient documentation

## 2019-03-28 DIAGNOSIS — S0990XA Unspecified injury of head, initial encounter: Secondary | ICD-10-CM | POA: Diagnosis not present

## 2019-03-28 DIAGNOSIS — Z87891 Personal history of nicotine dependence: Secondary | ICD-10-CM | POA: Insufficient documentation

## 2019-03-28 LAB — CBC
HCT: 33.7 % — ABNORMAL LOW (ref 36.0–46.0)
Hemoglobin: 9.9 g/dL — ABNORMAL LOW (ref 12.0–15.0)
MCH: 22.1 pg — ABNORMAL LOW (ref 26.0–34.0)
MCHC: 29.4 g/dL — ABNORMAL LOW (ref 30.0–36.0)
MCV: 75.4 fL — ABNORMAL LOW (ref 80.0–100.0)
Platelets: 262 10*3/uL (ref 150–400)
RBC: 4.47 MIL/uL (ref 3.87–5.11)
RDW: 19 % — ABNORMAL HIGH (ref 11.5–15.5)
WBC: 8.3 10*3/uL (ref 4.0–10.5)
nRBC: 0 % (ref 0.0–0.2)

## 2019-03-28 LAB — COMPREHENSIVE METABOLIC PANEL
ALT: 12 U/L (ref 0–44)
AST: 15 U/L (ref 15–41)
Albumin: 4.3 g/dL (ref 3.5–5.0)
Alkaline Phosphatase: 49 U/L (ref 38–126)
Anion gap: 12 (ref 5–15)
BUN: 11 mg/dL (ref 6–20)
CO2: 20 mmol/L — ABNORMAL LOW (ref 22–32)
Calcium: 9.3 mg/dL (ref 8.9–10.3)
Chloride: 108 mmol/L (ref 98–111)
Creatinine, Ser: 0.71 mg/dL (ref 0.44–1.00)
GFR calc Af Amer: >60 mL/min (ref >60)
GFR calc non Af Amer: >60 mL/min (ref >60)
Glucose, Bld: 98 mg/dL (ref 70–99)
Potassium: 3.7 mmol/L (ref 3.5–5.1)
Sodium: 140 mmol/L (ref 135–145)
Total Bilirubin: 0.3 mg/dL (ref 0.3–1.2)
Total Protein: 8.2 g/dL — ABNORMAL HIGH (ref 6.5–8.1)

## 2019-03-28 LAB — I-STAT BETA HCG BLOOD, ED (MC, WL, AP ONLY): I-stat hCG, quantitative: <5 m[IU]/mL (ref <5)

## 2019-03-28 LAB — TROPONIN I (HIGH SENSITIVITY)
Troponin I (High Sensitivity): 4 ng/L (ref <18)
Troponin I (High Sensitivity): 4 ng/L (ref <18)

## 2019-03-28 MED ORDER — KETOROLAC TROMETHAMINE 15 MG/ML IJ SOLN
15.0000 mg | Freq: Once | INTRAMUSCULAR | Status: AC
  Administered 2019-03-28: 15 mg via INTRAVENOUS
  Filled 2019-03-28: qty 1

## 2019-03-28 MED ORDER — SODIUM CHLORIDE 0.9% FLUSH: 3.0000 mL | Freq: Once | INTRAVENOUS | Status: DC

## 2019-03-28 MED ORDER — ACETAMINOPHEN 500 MG PO TABS
1000.0000 mg | ORAL_TABLET | Freq: Once | ORAL | Status: AC
  Administered 2019-03-28: 1000 mg via ORAL
  Filled 2019-03-28: qty 2
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
## 2019-03-28 NOTE — Discharge Instructions (Addendum)
Please return for any problem.  Follow-up with your regular care provider as instructed. °

## 2019-03-28 NOTE — ED Notes (Signed)
Patient verbalizes understanding of discharge instructions. Opportunity for questioning and answers were provided. Armband removed by staff, pt discharged from ED.  

## 2019-04-09 IMAGING — US US MFM FETAL BPP W/O NON-STRESS
1 series · 14 of 25 positions shown · non-contrast
Comparison: none

[Series 1: us mfm fetal bpp w/o non-stress · 25 acquisitions, 14 frames shown]
[im 1/25]
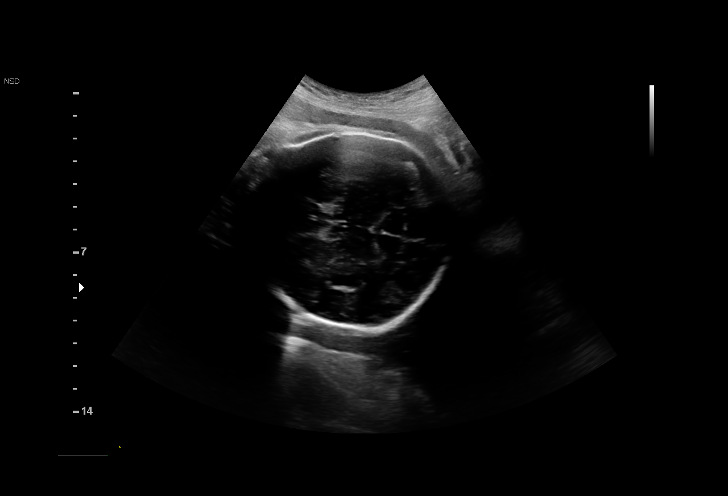
[im 3/25]
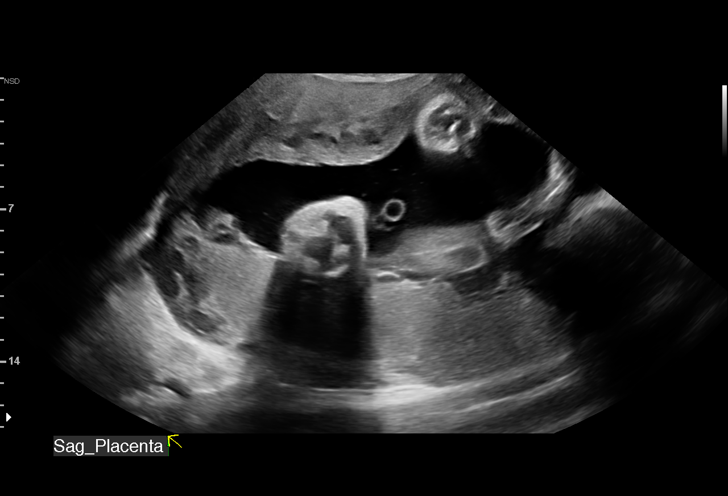
[im 5/25]
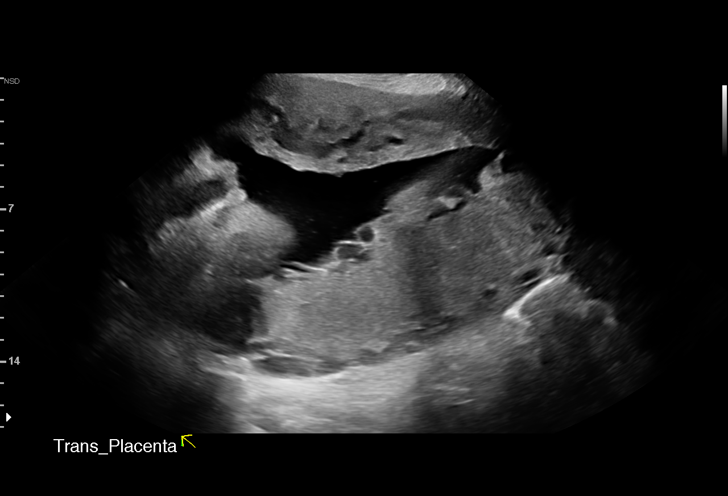
[im 7/25]
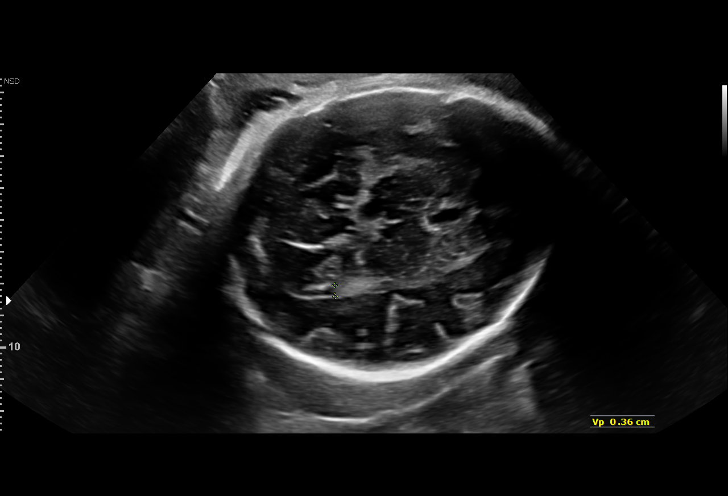
[im 9/25]
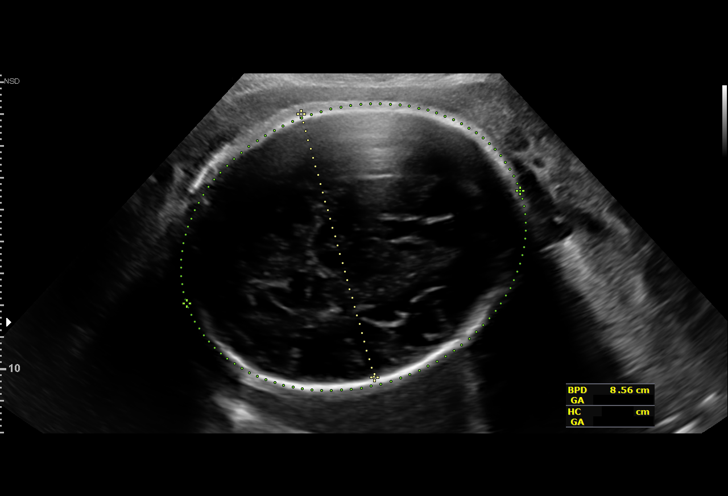
[im 10/25]
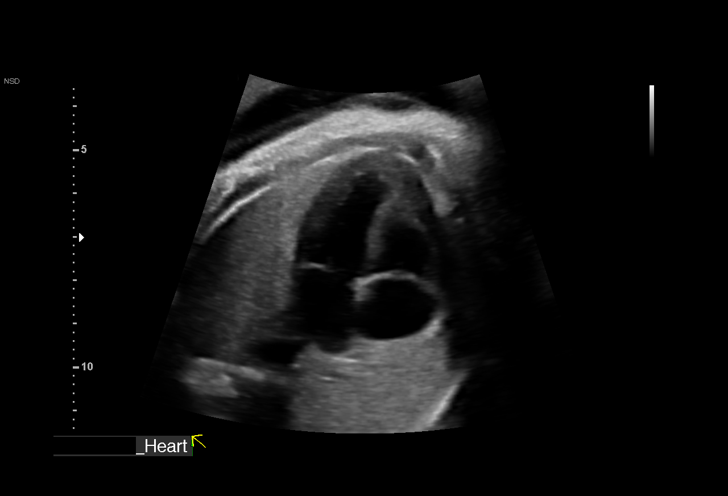
[im 12/25]
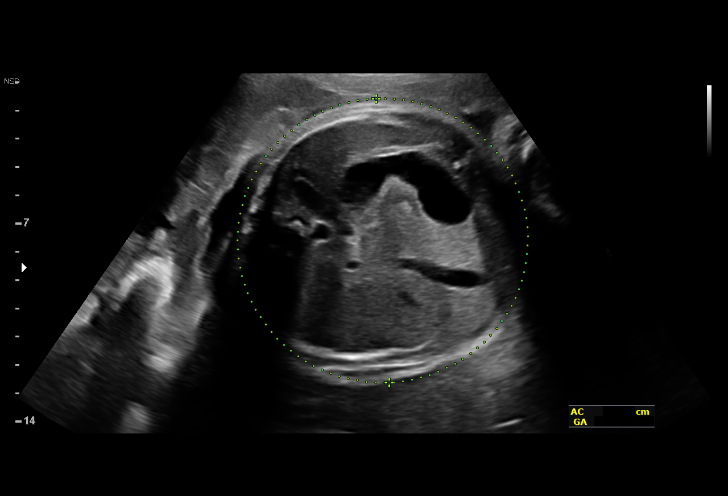
[im 14/25]
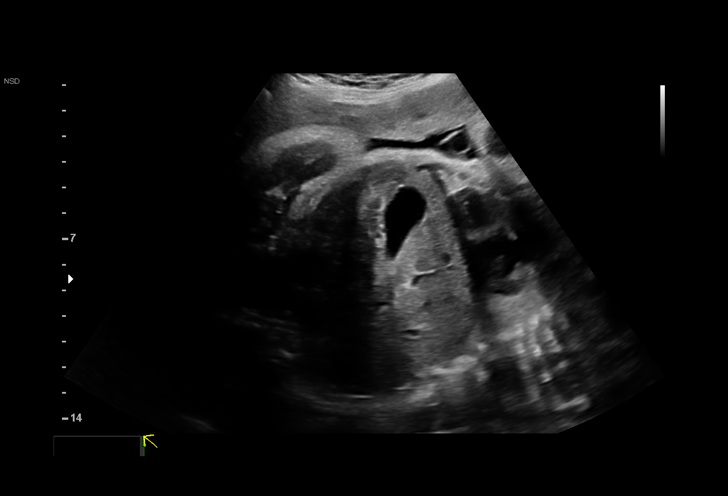
[im 16/25]
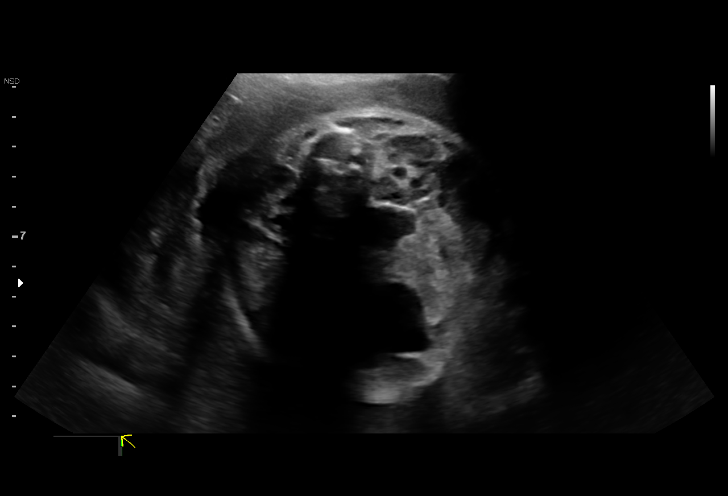
[im 17/25]
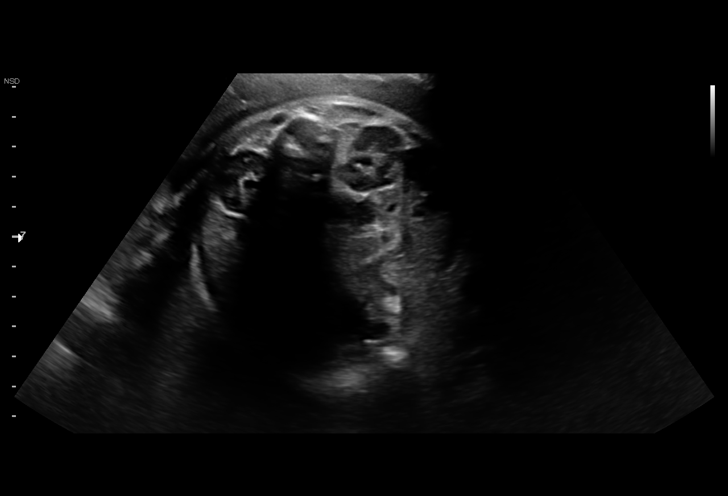
[im 19/25]
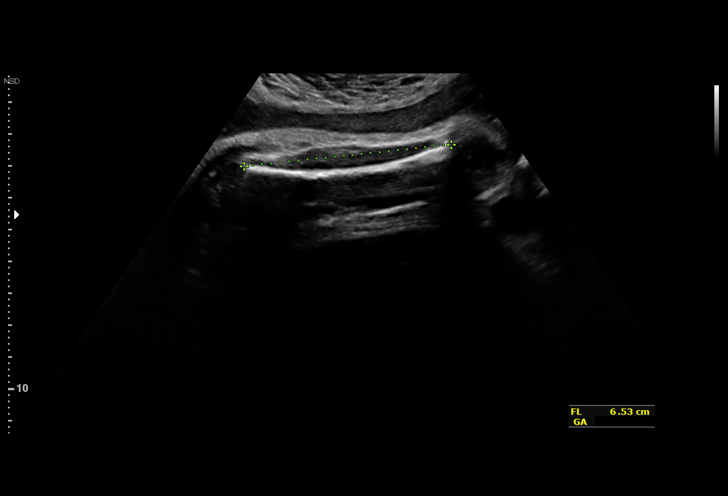
[im 21/25]
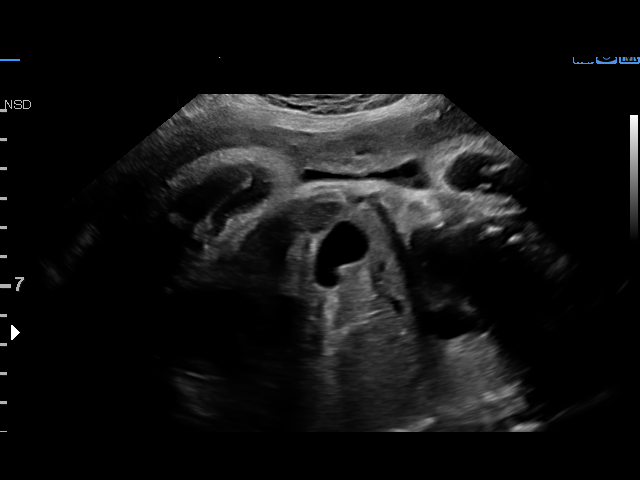
[im 23/25]
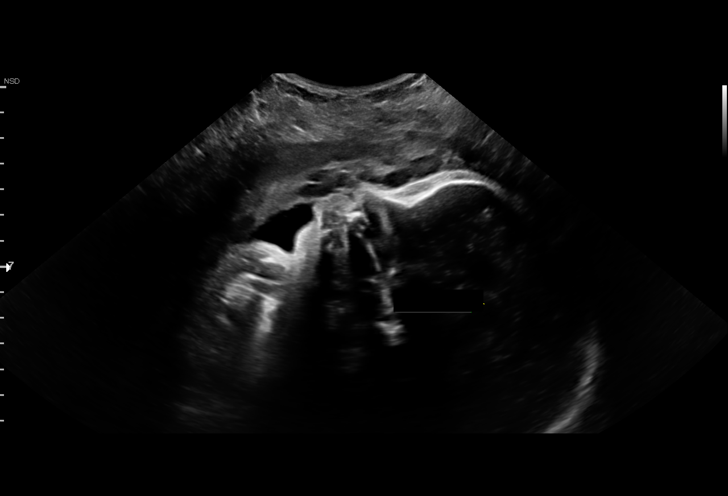
[im 25/25]
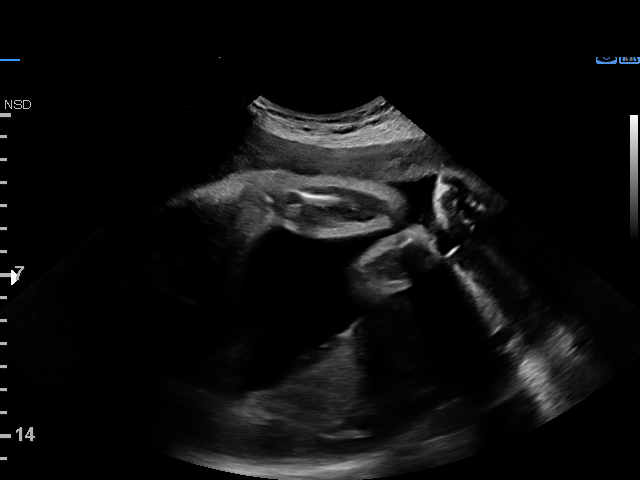

[14 of 25 positions shown; findings below may reference images not displayed]

DEEQA RAYAAN NP

Indications

35 weeks gestation of pregnancy
Substance abuse affecting pregnancy,
antepartum; alcohol
Hyperthyroid
Advanced maternal age multigravida 35+,
third trimester (77yrs old, low risk NIPS)
Hypertension - Chronic/Pre-existing
Late to prenatal care, third trimester
OB History

Blood Type:            Height:  5'8"   Weight (lb):  170       BMI:
Gravidity:    8         Term:   4        Prem:   0        SAB:   2
TOP:          1       Ectopic:  0        Living: 4
Fetal Evaluation

Num Of Fetuses:     1
Cardiac Activity:   Observed
Presentation:       Cephalic
Placenta:           Posterior, above cervical os

Amniotic Fluid
AFI FV:      Subjectively within normal limits

AFI Sum(cm)     %Tile       Largest Pocket(cm)
18.8            70

RUQ(cm)       RLQ(cm)       LUQ(cm)        LLQ(cm)
3.44
Biophysical Evaluation

Amniotic F.V:   Pocket => 2 cm two         F. Tone:        Observed
planes
F. Movement:    Observed                   Score:          [DATE]
F. Breathing:   Observed
Biometry

BPD:      85.5  mm     G. Age:  34w 3d         22  %    CI:        73.97   %    70 - 86
FL/HC:      20.8   %    20.1 -
HC:      315.7  mm     G. Age:  35w 3d         16  %    HC/AC:      0.99        0.93 -
AC:      318.6  mm     G. Age:  35w 5d         61  %    FL/BPD:     76.7   %    71 - 87
FL:       65.6  mm     G. Age:  33w 6d          8  %    FL/AC:      20.6   %    20 - 24

Est. FW:    0923  gm    5 lb 11 oz      50  %
Gestational Age

LMP:           40w 5d        Date:  02/13/17                 EDD:   11/20/17
U/S Today:     34w 6d                                        EDD:   12/31/17
Best:          35w 5d     Det. By:  Previous Ultrasound      EDD:   12/25/17
(05/12/17)
Anatomy

Cranium:               Appears normal         Aortic Arch:            Previously seen
Cavum:                 Previously seen        Ductal Arch:            Not well visualized
Ventricles:            Appears normal         Diaphragm:              Appears normal
Choroid Plexus:        Previously seen        Stomach:                Appears normal, left
sided
Cerebellum:            Previously seen        Abdomen:                Appears normal
Posterior Fossa:       Previously seen        Abdominal Wall:         Previously seen
Nuchal Fold:           Not applicable (>20    Cord Vessels:           Previously seen
wks GA)
Face:                  Orbits and profile     Kidneys:                Appear normal
previously seen
Lips:                  Previously seen        Bladder:                Appears normal
Thoracic:              Appears normal         Spine:                  Limited views
previously seen
Heart:                 Appears normal         Upper Extremities:      Previously seen
(4CH, axis, and situs
RVOT:                  Previously seen        Lower Extremities:      Previously seen
LVOT:                  Previously seen

Other:  Female gender previously seen. Heels previously visualized.
Technically difficult due to advanced gestational age.
Cervix Uterus Adnexa

Cervix
Not visualized (advanced GA >61wks)

Left Ovary
Previously seen.

Right Ovary
Previously seen
Impression

Single living intrauterine pregnancy at 35 weeks 5 days.
Appropriate interval fetal growth (50%).
Normal amniotic fluid volume.
Normal interval fetal anatomy.
BPP [DATE].
Recommendations

Continue antenatal fetal testing.

## 2019-04-29 ENCOUNTER — Telehealth: Payer: Self-pay | Admitting: General Practice

## 2019-04-29 NOTE — Telephone Encounter (Signed)
-----   Message from Eden Lathe, RN sent at 03/08/2019 10:30 AM EDT ----- Regarding: FW: ED f/u Needs appt.   Thanks ----- Message ----- From: Laurena Slimmer, RN Sent: 03/07/2019  11:08 PM EDT To: Eden Lathe, RN Subject: ED f/u                                         [facility] Hock,  I hope all is well,  this patient was seen in the ED last night and the EDP would like [place] to arrange for ED f/u.  She is several weeks postpartum and does not have insurance past 4 weeks of the delivery of the baby.   Thanks Wallace Keller RN BSN CNOR ED Case Manager Case Management Department [facility] [address], [place]. [place] Ph. [phone] Ph [phone] Fax: [phone] [email or web address]

## 2019-04-29 NOTE — Telephone Encounter (Signed)
Called to get the patient an appointment lvm for her to call [phone]

## 2019-05-24 ENCOUNTER — Encounter (HOSPITAL_COMMUNITY): Payer: Self-pay | Admitting: Emergency Medicine

## 2019-05-24 ENCOUNTER — Ambulatory Visit (HOSPITAL_COMMUNITY)
Admission: EM | Admit: 2019-05-24 | Discharge: 2019-05-24 | Disposition: A | Discharge status: Home / Self Care | Payer: Medicaid Other | Attending: Urgent Care | Admitting: Urgent Care

## 2019-05-24 ENCOUNTER — Other Ambulatory Visit: Payer: Self-pay

## 2019-05-24 DIAGNOSIS — R300 Dysuria: Secondary | ICD-10-CM | POA: Diagnosis not present

## 2019-05-24 DIAGNOSIS — R350 Frequency of micturition: Secondary | ICD-10-CM | POA: Diagnosis present

## 2019-05-24 DIAGNOSIS — Z7251 High risk heterosexual behavior: Secondary | ICD-10-CM | POA: Insufficient documentation

## 2019-05-24 DIAGNOSIS — R1030 Lower abdominal pain, unspecified: Secondary | ICD-10-CM | POA: Diagnosis present

## 2019-05-24 DIAGNOSIS — R0981 Nasal congestion: Secondary | ICD-10-CM | POA: Insufficient documentation

## 2019-05-24 DIAGNOSIS — R110 Nausea: Secondary | ICD-10-CM | POA: Diagnosis present

## 2019-05-24 LAB — POCT URINALYSIS DIP (DEVICE)
Bilirubin Urine: NEGATIVE
Glucose, UA: NEGATIVE mg/dL
Hgb urine dipstick: NEGATIVE
Ketones, ur: NEGATIVE mg/dL
Nitrite: NEGATIVE
Protein, ur: 30 mg/dL — AB
Specific Gravity, Urine: 1.025 (ref 1.005–1.030)
Urobilinogen, UA: 0.2 mg/dL (ref 0.0–1.0)
pH: 7 (ref 5.0–8.0)

## 2019-05-24 MED ORDER — KETOROLAC TROMETHAMINE 60 MG/2ML IM SOLN
INTRAMUSCULAR | Status: AC
  Filled 2019-05-24: qty 2

## 2019-05-24 MED ORDER — NAPROXEN 500 MG PO TABS: 500.0000 mg | ORAL_TABLET | Freq: Two times a day (BID) | ORAL | 0 refills | Status: DC

## 2019-05-24 MED ORDER — LABETALOL HCL 100 MG PO TABS: 100.0000 mg | ORAL_TABLET | Freq: Two times a day (BID) | ORAL | 0 refills | Status: DC

## 2019-05-24 MED ORDER — KETOROLAC TROMETHAMINE 60 MG/2ML IM SOLN
60.0000 mg | Freq: Once | INTRAMUSCULAR | Status: AC
  Administered 2019-05-24: 21:00:00 60 mg via INTRAMUSCULAR

## 2019-05-24 MED ORDER — SULFAMETHOXAZOLE-TRIMETHOPRIM 800-160 MG PO TABS: 1.0000 | ORAL_TABLET | Freq: Two times a day (BID) | ORAL | 0 refills | Status: DC

## 2019-05-24 NOTE — ED Triage Notes (Signed)
PT reports dysuria, lower abdominal pain, nasal congestion for 1 week.

## 2019-05-24 NOTE — ED Provider Notes (Signed)
MRN: [id number] DOB: Dec 04, 1980  Subjective:   Susan Walton is a 38 y.o. female presenting for 1 week history of persistent severe dysuria, lower abdominal/pelvic pain, low back pain.  Patient is also requesting refills of her labetalol, ran out of her medication 1 week ago.  She [facility] for her general care, has not gone back.  She is also concerned about STI, had unprotected sex with a new partner last week.  Patient has had tubal ligation.   No current facility-administered medications for this encounter.   Current Outpatient Medications:  .  ibuprofen (ADVIL,MOTRIN) 800 MG tablet, Take 1 tablet (800 mg total) by mouth 3 (three) times daily., Disp: 21 tablet, Rfl: 0 .  labetalol (NORMODYNE) 100 MG tablet, Take 1 tablet (100 mg total) by mouth 2 (two) times daily., Disp: 30 tablet, Rfl: 0    No Known Allergies   Past Medical History:  Diagnosis Date  . Anemia   . Fibroid   . Hypertension   . Retinal detachment   . Thyroid disease    Hypothyroidism     Past Surgical History:  Procedure Laterality Date  . DILATION AND CURETTAGE OF UTERUS    . EYE SURGERY    . TUBAL LIGATION N/A 12/11/2017   Procedure: POST PARTUM TUBAL LIGATION;  Surgeon: [name], MD;  Location: [facility];  Service: Gynecology;  Laterality: N/A;    Review of Systems  Constitutional: Positive for malaise/fatigue. Negative for fever.  HENT: Positive for congestion. Negative for ear pain, sinus pain and sore throat.   Eyes: Negative for discharge and redness.  Respiratory: Negative for cough, hemoptysis, shortness of breath and wheezing.   Cardiovascular: Negative for chest pain.  Gastrointestinal: Positive for abdominal pain and nausea. Negative for diarrhea and vomiting.  Genitourinary: Positive for dysuria, frequency and urgency. Negative for flank pain and hematuria.  Musculoskeletal: Negative for myalgias.  Skin: Negative for rash.  Neurological: Negative for  dizziness, weakness and headaches.  Psychiatric/Behavioral: Negative for depression and substance abuse.    Objective:   Vitals: BP (!) 147/103   Pulse 69   Temp 99 F (37.2 C) (Oral)   Resp 16   LMP 05/14/2019 Comment: tubal ligation per patient  SpO2 99%   Physical Exam Constitutional:      General: She is not in acute distress.    Appearance: Normal appearance. She is well-developed and normal weight. She is not ill-appearing, toxic-appearing or diaphoretic.  HENT:     Head: Normocephalic and atraumatic.     Nose: Nose normal.     Mouth/Throat:     Mouth: Mucous membranes are moist.  Eyes:     Extraocular Movements: Extraocular movements intact.     Pupils: Pupils are equal, round, and reactive to light.  Cardiovascular:     Rate and Rhythm: Normal rate and regular rhythm.     Pulses: Normal pulses.     Heart sounds: Normal heart sounds. No murmur. No friction rub. No gallop.   Pulmonary:     Effort: Pulmonary effort is normal. No respiratory distress.     Breath sounds: Normal breath sounds. No stridor. No wheezing, rhonchi or rales.  Abdominal:     General: Bowel sounds are normal. There is no distension.     Palpations: Abdomen is soft. There is no mass.     Tenderness: There is abdominal tenderness. There is right CVA tenderness and left CVA tenderness. There is no guarding or rebound.  Skin:  General: Skin is warm and dry.     Findings: No rash.  Neurological:     Mental Status: She is alert and oriented to person, place, and time.  Psychiatric:        Mood and Affect: Mood normal.        Behavior: Behavior normal.        Thought Content: Thought content normal.        Judgment: Judgment normal.     Comments: Calm and in no distress.     Results for orders placed or performed during the hospital encounter of 05/24/19 (from the past 24 hour(s))  POCT urinalysis dip (device)     Status: Abnormal   Collection Time: 05/24/19  8:08 PM  Result Value Ref  Range   Glucose, UA NEGATIVE NEGATIVE mg/dL   Bilirubin Urine NEGATIVE NEGATIVE   Ketones, ur NEGATIVE NEGATIVE mg/dL   Specific Gravity, Urine 1.025 1.005 - 1.030   Hgb urine dipstick NEGATIVE NEGATIVE   pH 7.0 5.0 - 8.0   Protein, ur 30 (A) NEGATIVE mg/dL   Urobilinogen, UA 0.2 0.0 - 1.0 mg/dL   Nitrite NEGATIVE NEGATIVE   Leukocytes,Ua SMALL (A) NEGATIVE    Assessment and Plan :   1. Dysuria   2. Urinary frequency   3. Unprotected sex   4. Lower abdominal pain   5. Nausea without vomiting   6. Nasal congestion     Patient reported severe pain during exam but remained calm and overall appearance inconsistent with experiencing severe pain on palpation.  However, I offered patient a Toradol injection to help with her severe pain.  We will have her use naproxen outpatient.  Start Bactrim for cystitis, will treat for STI pending lab results.  Refilled her labetalol for 15 days only, emphasized need for patient to have follow-up with her PCP. Counseled patient on potential for adverse effects with medications prescribed/recommended today, ER and return-to-clinic precautions discussed, patient verbalized understanding.    Jaynee Eagles, [place] 05/25/19 [phone]

## 2019-05-25 LAB — URINE CULTURE

## 2019-05-26 ENCOUNTER — Telehealth (HOSPITAL_COMMUNITY): Payer: Self-pay | Admitting: Emergency Medicine

## 2019-05-26 LAB — CERVICOVAGINAL ANCILLARY ONLY
Bacterial vaginitis: NEGATIVE
Candida vaginitis: POSITIVE — AB
Chlamydia: NEGATIVE
Neisseria Gonorrhea: NEGATIVE
Trichomonas: NEGATIVE

## 2019-05-26 MED ORDER — FLUCONAZOLE 150 MG PO TABS: 150.0000 mg | ORAL_TABLET | Freq: Once | ORAL | 0 refills | Status: AC

## 2019-05-26 NOTE — Telephone Encounter (Signed)
Test for candida (yeast) was positive.  Prescription for fluconazole 150mg po now, repeat dose in 3d if needed, #2 no refills, sent to the pharmacy of record.  Recheck or followup with PCP for further evaluation if symptoms are not improving.    Patient contacted and made aware of    results, all questions answered   

## 2019-10-12 ENCOUNTER — Ambulatory Visit (HOSPITAL_COMMUNITY)
Admission: EM | Admit: 2019-10-12 | Discharge: 2019-10-12 | Disposition: A | Discharge status: Home / Self Care | Payer: Medicaid Other | Attending: Family Medicine | Admitting: Family Medicine

## 2019-10-12 ENCOUNTER — Encounter (HOSPITAL_COMMUNITY): Payer: Self-pay | Admitting: Emergency Medicine

## 2019-10-12 ENCOUNTER — Other Ambulatory Visit: Payer: Self-pay

## 2019-10-12 DIAGNOSIS — N898 Other specified noninflammatory disorders of vagina: Secondary | ICD-10-CM | POA: Insufficient documentation

## 2019-10-12 DIAGNOSIS — Z20822 Contact with and (suspected) exposure to covid-19: Secondary | ICD-10-CM | POA: Insufficient documentation

## 2019-10-12 DIAGNOSIS — J069 Acute upper respiratory infection, unspecified: Secondary | ICD-10-CM | POA: Diagnosis not present

## 2019-10-12 DIAGNOSIS — Z87891 Personal history of nicotine dependence: Secondary | ICD-10-CM | POA: Insufficient documentation

## 2019-10-12 DIAGNOSIS — R300 Dysuria: Secondary | ICD-10-CM | POA: Diagnosis not present

## 2019-10-12 DIAGNOSIS — I10 Essential (primary) hypertension: Secondary | ICD-10-CM | POA: Diagnosis not present

## 2019-10-12 DIAGNOSIS — R519 Headache, unspecified: Secondary | ICD-10-CM | POA: Diagnosis present

## 2019-10-12 DIAGNOSIS — K1370 Unspecified lesions of oral mucosa: Secondary | ICD-10-CM | POA: Insufficient documentation

## 2019-10-12 DIAGNOSIS — E039 Hypothyroidism, unspecified: Secondary | ICD-10-CM | POA: Diagnosis not present

## 2019-10-12 DIAGNOSIS — Z3202 Encounter for pregnancy test, result negative: Secondary | ICD-10-CM | POA: Diagnosis not present

## 2019-10-12 DIAGNOSIS — Z79899 Other long term (current) drug therapy: Secondary | ICD-10-CM | POA: Insufficient documentation

## 2019-10-12 LAB — POCT URINALYSIS DIP (DEVICE)
Bilirubin Urine: NEGATIVE
Glucose, UA: NEGATIVE mg/dL
Hgb urine dipstick: NEGATIVE
Ketones, ur: NEGATIVE mg/dL
Leukocytes,Ua: NEGATIVE
Nitrite: NEGATIVE
Protein, ur: NEGATIVE mg/dL
Specific Gravity, Urine: 1.025 (ref 1.005–1.030)
Urobilinogen, UA: 0.2 mg/dL (ref 0.0–1.0)
pH: 7 (ref 5.0–8.0)

## 2019-10-12 LAB — POC URINE PREG, ED
Preg Test, Ur: NEGATIVE
Preg Test, Ur: NEGATIVE

## 2019-10-12 LAB — POCT PREGNANCY, URINE: Preg Test, Ur: NEGATIVE

## 2019-10-12 MED ORDER — CHLORHEXIDINE GLUCONATE 0.12 % MT SOLN: 15.0000 mL | Freq: Two times a day (BID) | OROMUCOSAL | 0 refills | Status: DC

## 2019-10-12 MED ORDER — FLUCONAZOLE 150 MG PO TABS: 150.0000 mg | ORAL_TABLET | Freq: Once | ORAL | 0 refills | Status: AC

## 2019-10-12 MED ORDER — BENZONATATE 200 MG PO CAPS: 200.0000 mg | ORAL_CAPSULE | Freq: Three times a day (TID) | ORAL | 0 refills | Status: AC | PRN

## 2019-10-12 MED ORDER — LIDOCAINE VISCOUS HCL 2 % MT SOLN: 15.0000 mL | Freq: Four times a day (QID) | OROMUCOSAL | 0 refills | Status: DC | PRN

## 2019-10-12 NOTE — Discharge Instructions (Addendum)
COVID swab pending, returns in approximately 2 days Susan Walton will awaiting results, if positive quarantine for 10 days Tessalon every 8 hours as needed for cough Continue to monitor for resolution of symptoms  Urine normal, Vaginal swab pending May use diflucan for yeast We will send in any further medicine needed based off swab results  I expect lesions in mouth to resolve with time Use peridex twice daily to prevent infection from developing May use lidocaine as needed for pain  Follow up if any symptoms not improving or worsening

## 2019-10-12 NOTE — ED Provider Notes (Signed)
[facility]    CSN: [id number] Arrival date & time: 10/12/19  1319      History   Chief Complaint Chief Complaint  Patient presents with  . Headache  . Cough    HPI Susan Walton is a 39 y.o. female history of hypertension, hypothyroidism, presenting today for evaluation of multiple complaints including URI symptoms, dysuria/discharge and oral lesions.  Patient believes she was exposed to mold at work on Monday, yesterday she began to develop cough, body aches, headache and felt was feeling nauseous.  She was having some chest tightness.  All of the symptoms have improved today and are very mild.  She is unsure of any fevers, denies chills.  Denies any known Covid exposure.  Denies vomiting or diarrhea.  She also notes that she has had a green discharge with associated burning over the past few days.  She used over-the-counter miconazole to treat for yeast and symptoms have improved.  She continues to have some mild dysuria and frequency.  Mild abdominal pain.  Last menstrual cycle was around 1/19.  She is also concerned about lesions in her mouth.  She has had a lesion to the roof of her mouth that is painful as well as an area that has been painful to her left cheek but believes she bit on accident.  Symptoms overall have improved with discomfort.  HPI  Past Medical History:  Diagnosis Date  . Anemia   . Fibroid   . Hypertension   . Retinal detachment   . Thyroid disease    Hypothyroidism    Patient Active Problem List   Diagnosis Date Noted  . Group B Streptococcus carrier, +RV culture, currently pregnant 12/10/2017  . Gonorrhea affecting pregnancy in first trimester 12/10/2017  . 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 Pregnancy, supervision, high-risk 07/27/2017  . Chronic hypertension with superimposed severe preeclampsia 07/27/2017  . Drug abuse during pregnancy ([facility]) 07/27/2017  . Thyroid dysfunction, antepartum  07/27/2017  . History of trichomoniasis 07/27/2017  . Unwanted fertility 07/27/2017  . Advanced maternal age in multigravida, second trimester   . Adjustment disorder with depressed mood 06/05/2017    Past Surgical History:  Procedure Laterality Date  . DILATION AND CURETTAGE OF UTERUS    . EYE SURGERY    . TUBAL LIGATION N/A 12/11/2017   Procedure: POST PARTUM TUBAL LIGATION;  Surgeon: [name], MD;  Location: [facility];  Service: Gynecology;  Laterality: N/A;    OB History    Gravida  8   Para  5   Term  5   Preterm  0   AB  3   Living  5     SAB  2   TAB  1   Ectopic  0   Multiple  0   Live Births  5            Home Medications    Prior to Admission medications   Medication Sig Start Date End Date Taking? Authorizing Provider  benzonatate (TESSALON) 200 MG capsule Take 1 capsule (200 mg total) by mouth 3 (three) times daily as needed for up to 7 days for cough. 10/12/19 10/19/19  Jago Carton C, PA-C  chlorhexidine (PERIDEX) 0.12 % solution Use as directed 15 mLs in the mouth or throat 2 (two) times daily. 10/12/19   Lerae Langham C, PA-C  fluconazole (DIFLUCAN) 150 MG tablet Take 1 tablet (150 mg total) by mouth once for 1 dose. 10/12/19 10/12/19  Gwendolen Hewlett C, PA-C  ibuprofen (ADVIL,MOTRIN) 800 MG tablet Take 1 tablet (800 mg total) by mouth 3 (three) times daily. 09/11/18   Tereasa Coop, PA-C  labetalol (NORMODYNE) 100 MG tablet Take 1 tablet (100 mg total) by mouth 2 (two) times daily. 05/24/19   Jaynee Eagles, PA-C  lidocaine (XYLOCAINE) 2 % solution Use as directed 15 mLs in the mouth or throat every 6 (six) hours as needed for mouth pain. 10/12/19   Ayvion Kavanagh C, PA-C  naproxen (NAPROSYN) 500 MG tablet Take 1 tablet (500 mg total) by mouth 2 (two) times daily. 05/24/19   Jaynee Eagles, PA-C  sulfamethoxazole-trimethoprim (BACTRIM DS) 800-160 MG tablet Take 1 tablet by mouth 2 (two) times daily. 05/24/19   Jaynee Eagles, PA-C   NIFEdipine (PROCARDIA-XL/ADALAT CC) 60 MG 24 hr tablet Take 1 tablet (60 mg total) by mouth 2 (two) times daily. Patient not taking: Reported on 03/28/2019 12/13/17 05/24/19  Osborne Oman, MD  omeprazole (PRILOSEC) 40 MG capsule Take 1 capsule (40 mg total) by mouth daily. 30 minutes before breakfast Patient not taking: Reported on 03/28/2019 05/12/18 05/24/19  Mauri Pole, MD    Family History Family History  Problem Relation Age of Onset  . COPD Maternal Grandfather   . Liver disease Paternal Grandmother   . Liver disease Paternal Grandfather     Social History Social History   Tobacco Use  . Smoking status: Former Smoker    Packs/day: 0.50    Types: Cigarettes  . Smokeless tobacco: Never Used  Substance Use Topics  . Alcohol use: Not Currently    Comment: quit mth after +UPT  . Drug use: No    Types: Marijuana, Cocaine    Comment: quit 1 mth after +UPT     Allergies   Patient has no known allergies.   Review of Systems Review of Systems  Constitutional: Negative for activity change, appetite change, chills, fatigue and fever.  HENT: Positive for mouth sores. Negative for congestion, ear pain, rhinorrhea, sinus pressure, sore throat and trouble swallowing.   Eyes: Negative for discharge and redness.  Respiratory: Positive for cough and chest tightness. Negative for shortness of breath.   Cardiovascular: Negative for chest pain.  Gastrointestinal: Positive for nausea. Negative for abdominal pain, diarrhea and vomiting.  Genitourinary: Positive for dysuria and vaginal discharge. Negative for flank pain, genital sores, hematuria, menstrual problem, vaginal bleeding and vaginal pain.  Musculoskeletal: Positive for myalgias. Negative for back pain.  Skin: Negative for rash.  Neurological: Positive for headaches. Negative for dizziness and light-headedness.     Physical Exam Triage Vital Signs ED Triage Vitals  Enc Vitals Group     BP 10/12/19 1525 (!) 173/109      Pulse Rate 10/12/19 1524 78     Resp 10/12/19 1524 18     Temp 10/12/19 1524 99 F (37.2 C)     Temp Source 10/12/19 1524 Oral     SpO2 10/12/19 1524 100 %     Weight --      Height --      Head Circumference --      Peak Flow --      Pain Score 10/12/19 1525 0     Pain Loc --      Pain Edu? --      Excl. in [place]? --    No data found.  Updated Vital Signs BP (!) 173/109   Pulse 78   Temp 99 F (37.2 C) (Oral)  Resp 18   LMP 10/04/2019   SpO2 100%   Visual Acuity Right Eye Distance:   Left Eye Distance:   Bilateral Distance:    Right Eye Near:   Left Eye Near:    Bilateral Near:     Physical Exam Vitals and nursing note reviewed.  Constitutional:      General: She is not in acute distress.    Appearance: She is well-developed.  HENT:     Head: Normocephalic and atraumatic.     Mouth/Throat:     Comments: Oral mucosa pink and moist, no tonsillar enlargement or exudate. Posterior pharynx patent and nonerythematous, no uvula deviation or swelling. Normal phonation.  Left posterior soft palate with yellowish circular lesion, mild tenderness to touch, no surrounding erythema, no drainage Left buccal mucosa with irregularity and mild whitish healing appearance, no erythema Eyes:     Conjunctiva/sclera: Conjunctivae normal.  Cardiovascular:     Rate and Rhythm: Normal rate and regular rhythm.     Heart sounds: No murmur.  Pulmonary:     Effort: Pulmonary effort is normal. No respiratory distress.     Breath sounds: Normal breath sounds.     Comments: Breathing comfortably at rest, CTABL, no wheezing, rales or other adventitious sounds auscultated Abdominal:     Palpations: Abdomen is soft.     Tenderness: There is no abdominal tenderness.  Musculoskeletal:     Cervical back: Neck supple.  Skin:    General: Skin is warm and dry.  Neurological:     Mental Status: She is alert.      UC Treatments / Results  Labs (all labs ordered are listed, but only  abnormal results are displayed) Labs Reviewed  NOVEL CORONAVIRUS, NAA (HOSP ORDER, SEND-OUT TO REF LAB; TAT 18-24 HRS)  POCT URINALYSIS DIP (DEVICE)  POC URINE PREG, ED  POCT PREGNANCY, URINE  POC URINE PREG, ED  CERVICOVAGINAL ANCILLARY ONLY    EKG   Radiology No results found.  Procedures Procedures (including critical care time)  Medications Ordered in UC Medications - No data to display  Initial Impression / Assessment and Plan / UC Course  I have reviewed the triage vital signs and the nursing notes.  Pertinent labs & imaging results that were available during my care of the patient were reviewed by me and considered in my medical decision making (see chart for details).     Covid PCR pending, URI symptoms improving, lungs clear, vital signs stable, do not suspect underlying pneumonia, given short exposure to mold, do not expect complications from this.  Will have quarantine until results return.  Treat symptomatically and supportively, monitor for gradual resolution.  Oral lesions, lesion more posteriorly suggestive of aphthous ulcer, continue to monitor, area on mouth appears likely traumatic with secondary healing, will provide Peridex to prevent infection as well as lidocaine to use as needed for pain.  Would also expect gradual resolution.  Continue to monitor.  UA completely unremarkable, no signs of infection.  Vaginal swab pending.  Prior history of yeast infection with green discharge, used over-the-counter medicine, will provide Diflucan to further treat for this.  Will provide further treatment as needed based off results.  Discussed strict return precautions. Patient verbalized understanding and is agreeable with plan.  Final Clinical Impressions(s) / UC Diagnoses   Final diagnoses:  Viral URI with cough  Oral lesion  Vaginal discharge     Discharge Instructions     COVID swab pending, returns in approximately 2 days Quarantine  will awaiting results,  if positive quarantine for 10 days Tessalon every 8 hours as needed for cough Continue to monitor for resolution of symptoms  Urine normal, Vaginal swab pending May use diflucan for yeast We will send in any further medicine needed based off swab results  I expect lesions in mouth to resolve with time Use peridex twice daily to prevent infection from developing May use lidocaine as needed for pain  Follow up if any symptoms not improving or worsening    ED Prescriptions    Medication Sig Dispense Auth. Provider   chlorhexidine (PERIDEX) 0.12 % solution Use as directed 15 mLs in the mouth or throat 2 (two) times daily. 120 mL Zeena Starkel C, PA-C   lidocaine (XYLOCAINE) 2 % solution Use as directed 15 mLs in the mouth or throat every 6 (six) hours as needed for mouth pain. 100 mL Isis Costanza C, PA-C   benzonatate (TESSALON) 200 MG capsule Take 1 capsule (200 mg total) by mouth 3 (three) times daily as needed for up to 7 days for cough. 28 capsule Delyla Sandeen C, PA-C   fluconazole (DIFLUCAN) 150 MG tablet Take 1 tablet (150 mg total) by mouth once for 1 dose. 2 tablet Rayquan Amrhein, [place] C, PA-C     PDMP not reviewed this encounter.   Janith Lima, [place] 10/12/19 1713

## 2019-10-12 NOTE — ED Triage Notes (Signed)
PT reports headache, cough after exposure to COVID last Thursday.   Also reports green vaginal discharge.   Wants bumps in mouth examined.

## 2019-10-14 LAB — CERVICOVAGINAL ANCILLARY ONLY
Bacterial vaginitis: POSITIVE — AB
Candida vaginitis: NEGATIVE
Chlamydia: NEGATIVE
Neisseria Gonorrhea: NEGATIVE
Trichomonas: NEGATIVE

## 2019-10-14 LAB — NOVEL CORONAVIRUS, NAA (HOSP ORDER, SEND-OUT TO REF LAB; TAT 18-24 HRS): SARS-CoV-2, NAA: NOT DETECTED

## 2019-10-15 ENCOUNTER — Telehealth (HOSPITAL_COMMUNITY): Payer: Self-pay | Admitting: Emergency Medicine

## 2019-10-15 MED ORDER — METRONIDAZOLE 500 MG PO TABS: 500.0000 mg | ORAL_TABLET | Freq: Two times a day (BID) | ORAL | 0 refills | Status: AC

## 2019-10-15 NOTE — Telephone Encounter (Signed)
Bacterial vaginosis is positive. Pt needs treatment. Flagyl 500 mg BID x 7 days #14 no refills sent to patients pharmacy of choice.    Patient contacted by phone and made aware of    results. Pt verbalized understanding and had all questions answered.    

## 2019-11-05 ENCOUNTER — Other Ambulatory Visit: Payer: Self-pay

## 2019-11-05 ENCOUNTER — Encounter (HOSPITAL_COMMUNITY): Payer: Self-pay | Admitting: *Deleted

## 2019-11-05 ENCOUNTER — Ambulatory Visit (HOSPITAL_COMMUNITY)
Admission: EM | Admit: 2019-11-05 | Discharge: 2019-11-05 | Disposition: A | Discharge status: Home / Self Care | Payer: Medicaid Other | Attending: Family Medicine | Admitting: Family Medicine

## 2019-11-05 DIAGNOSIS — B349 Viral infection, unspecified: Secondary | ICD-10-CM | POA: Diagnosis not present

## 2019-11-05 DIAGNOSIS — I10 Essential (primary) hypertension: Secondary | ICD-10-CM | POA: Insufficient documentation

## 2019-11-05 DIAGNOSIS — Z791 Long term (current) use of non-steroidal anti-inflammatories (NSAID): Secondary | ICD-10-CM | POA: Insufficient documentation

## 2019-11-05 DIAGNOSIS — E039 Hypothyroidism, unspecified: Secondary | ICD-10-CM | POA: Insufficient documentation

## 2019-11-05 DIAGNOSIS — Z79899 Other long term (current) drug therapy: Secondary | ICD-10-CM | POA: Diagnosis not present

## 2019-11-05 DIAGNOSIS — R438 Other disturbances of smell and taste: Secondary | ICD-10-CM | POA: Insufficient documentation

## 2019-11-05 DIAGNOSIS — Z87891 Personal history of nicotine dependence: Secondary | ICD-10-CM | POA: Insufficient documentation

## 2019-11-05 DIAGNOSIS — R05 Cough: Secondary | ICD-10-CM | POA: Diagnosis present

## 2019-11-05 DIAGNOSIS — Z20822 Contact with and (suspected) exposure to covid-19: Secondary | ICD-10-CM | POA: Insufficient documentation

## 2019-11-05 MED ORDER — CETIRIZINE-PSEUDOEPHEDRINE ER 5-120 MG PO TB12: 1.0000 | ORAL_TABLET | Freq: Every day | ORAL | 0 refills | Status: DC

## 2019-11-05 MED ORDER — MUCINEX 600 MG PO TB12: 600.0000 mg | ORAL_TABLET | Freq: Two times a day (BID) | ORAL | 0 refills | Status: DC

## 2019-11-05 NOTE — ED Triage Notes (Addendum)
Pt reports starting with loss of taste and smell, along with cough, SOB, body aches, runny nose, sore throat, bilat watery eyes and sneezing 2 days ago.  Denies fevers.  Has not taken any meds.

## 2019-11-05 NOTE — ED Provider Notes (Signed)
[facility]    CSN: [id number] Arrival date & time: 11/05/19  1627      History   Chief Complaint Chief Complaint  Patient presents with  . Appointment    1630  . Shortness of Breath  . Cough    HPI Susan Walton is a 39 y.o. female.   Patient is a 50-year female presents today with approximately 2 days of nasal congestion, rhinorrhea, sneezing, loss of taste and smell, cough, body aches, sore throat, itchy watery eyes.  Symptoms been constant.  She is not taking medication for symptoms.  Possibly exposed to Covid.  Low-grade fever here today.  No recorded fevers at home.  No chest pain or shortness of breath.  ROS per HPI      Past Medical History:  Diagnosis Date  . Anemia   . Fibroid   . Hypertension   . Retinal detachment   . Thyroid disease    Hypothyroidism    Patient Active Problem List   Diagnosis Date Noted  . Group B Streptococcus carrier, +RV culture, currently pregnant 12/10/2017  . Gonorrhea affecting pregnancy in first trimester 12/10/2017  . 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 Pregnancy, supervision, high-risk 07/27/2017  . Chronic hypertension with superimposed severe preeclampsia 07/27/2017  . 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 Thyroid dysfunction, antepartum 07/27/2017  . History of trichomoniasis 07/27/2017  . Unwanted fertility 07/27/2017  . Advanced maternal age in multigravida, second trimester   . Adjustment disorder with depressed mood 06/05/2017    Past Surgical History:  Procedure Laterality Date  . DILATION AND CURETTAGE OF UTERUS    . EYE SURGERY    . TUBAL LIGATION N/A 12/11/2017   Procedure: POST PARTUM TUBAL LIGATION;  Surgeon: [name], MD;  Location: [facility];  Service: Gynecology;  Laterality: N/A;    OB History    Gravida  8   Para  5   Term  5   Preterm  0   AB  3   Living  5     SAB  2   TAB  1   Ectopic  0   Multiple   0   Live Births  5            Home Medications    Prior to Admission medications   Medication Sig Start Date End Date Taking? Authorizing Provider  ibuprofen (ADVIL,MOTRIN) 800 MG tablet Take 1 tablet (800 mg total) by mouth 3 (three) times daily. 09/11/18  Yes Tereasa Coop, PA-C  labetalol (NORMODYNE) 100 MG tablet Take 1 tablet (100 mg total) by mouth 2 (two) times daily. 05/24/19  Yes Jaynee Eagles, PA-C  PRESCRIPTION MEDICATION Pt has Rx for Flagyl called in to pharmacy for BV - has not picked up Rx yet   Yes [provider]  cetirizine-pseudoephedrine (ZYRTEC-D) 5-120 MG tablet Take 1 tablet by mouth daily. 11/05/19   Loura Halt A, NP  chlorhexidine (PERIDEX) 0.12 % solution Use as directed 15 mLs in the mouth or throat 2 (two) times daily. 10/12/19   Wieters, Hallie C, PA-C  guaiFENesin (MUCINEX) 600 MG 12 hr tablet Take 1 tablet (600 mg total) by mouth 2 (two) times daily. 11/05/19   Loura Halt A, NP  lidocaine (XYLOCAINE) 2 % solution Use as directed 15 mLs in the mouth or throat every 6 (six) hours as needed for mouth pain. 10/12/19   Wieters, Hallie C, PA-C  naproxen (NAPROSYN) 500 MG  tablet Take 1 tablet (500 mg total) by mouth 2 (two) times daily. 05/24/19   Jaynee Eagles, PA-C  sulfamethoxazole-trimethoprim (BACTRIM DS) 800-160 MG tablet Take 1 tablet by mouth 2 (two) times daily. 05/24/19   Jaynee Eagles, PA-C  NIFEdipine (PROCARDIA-XL/ADALAT CC) 60 MG 24 hr tablet Take 1 tablet (60 mg total) by mouth 2 (two) times daily. Patient not taking: Reported on 03/28/2019 12/13/17 05/24/19  Osborne Oman, MD  omeprazole (PRILOSEC) 40 MG capsule Take 1 capsule (40 mg total) by mouth daily. 30 minutes before breakfast Patient not taking: Reported on 03/28/2019 05/12/18 05/24/19  Mauri Pole, MD    Family History Family History  Problem Relation Age of Onset  . COPD Maternal Grandfather   . Liver disease Paternal Grandmother   . Liver disease Paternal Grandfather      Social History Social History   Tobacco Use  . Smoking status: Former Smoker    Packs/day: 0.50    Types: Cigarettes  . Smokeless tobacco: Never Used  Substance Use Topics  . Alcohol use: Not Currently  . Drug use: Not Currently    Types: Marijuana, Cocaine     Allergies   Patient has no known allergies.   Review of Systems Review of Systems   Physical Exam Triage Vital Signs ED Triage Vitals  Enc Vitals Group     BP 11/05/19 1653 (!) 172/107     Pulse Rate 11/05/19 1653 77     Resp 11/05/19 1653 20     Temp 11/05/19 1653 99.1 F (37.3 C)     Temp Source 11/05/19 1653 Oral     SpO2 11/05/19 1653 99 %     Weight --      Height --      Head Circumference --      Peak Flow --      Pain Score 11/05/19 1648 3     Pain Loc --      Pain Edu? --      Excl. in [place]? --    No data found.  Updated Vital Signs BP (!) 172/107 Comment: has not taken HTN med x 2 days  Pulse 77   Temp 99.1 F (37.3 C) (Oral)   Resp 20   LMP 11/03/2019 (Exact Date)   SpO2 99%   Breastfeeding No   Visual Acuity Right Eye Distance:   Left Eye Distance:   Bilateral Distance:    Right Eye Near:   Left Eye Near:    Bilateral Near:     Physical Exam Vitals and nursing note reviewed.  Constitutional:      General: She is not in acute distress.    Appearance: She is well-developed. She is not ill-appearing, toxic-appearing or diaphoretic.  HENT:     Head: Normocephalic and atraumatic.     Nose: Congestion and rhinorrhea present.  Cardiovascular:     Rate and Rhythm: Normal rate and regular rhythm.  Pulmonary:     Effort: Pulmonary effort is normal.     Breath sounds: Normal breath sounds.  Musculoskeletal:        General: Normal range of motion.     Cervical back: Normal range of motion.  Skin:    General: Skin is warm and dry.  Neurological:     Mental Status: She is alert.  Psychiatric:        Mood and Affect: Mood normal.      UC Treatments / Results   Labs (all labs ordered are listed,  but only abnormal results are displayed) Labs Reviewed  NOVEL CORONAVIRUS, NAA (HOSP ORDER, SEND-OUT TO REF LAB; TAT 18-24 HRS)    EKG   Radiology No results found.  Procedures Procedures (including critical care time)  Medications Ordered in UC Medications - No data to display  Initial Impression / Assessment and Plan / UC Course  I have reviewed the triage vital signs and the nursing notes.  Pertinent labs & imaging results that were available during my care of the patient were reviewed by me and considered in my medical decision making (see chart for details).     Viral illness-highly suspicious for Covid based on symptoms. Covid swab sent for testing with labs pending.  Quarantine precautions given and work note given. Medication sent to the pharmacy for symptoms Final Clinical Impressions(s) / UC Diagnoses   Final diagnoses:  Viral illness     Discharge Instructions     This is most likely a viral illness.  Highly suspicious for Covid. Covid swab sent for testing with labs pending. Make sure you go home and quarantine until we get your results Sending some  medications to the pharmacy to help with your symptoms Follow up as needed for continued or worsening symptoms      ED Prescriptions    Medication Sig Dispense Auth. Provider   cetirizine-pseudoephedrine (ZYRTEC-D) 5-120 MG tablet Take 1 tablet by mouth daily. 30 tablet Rosemary Mossbarger A, NP   guaiFENesin (MUCINEX) 600 MG 12 hr tablet Take 1 tablet (600 mg total) by mouth 2 (two) times daily. 15 tablet Syanne Looney A, NP     PDMP not reviewed this encounter.   Orvan July, NP 11/05/19 1722

## 2019-11-05 NOTE — Discharge Instructions (Signed)
This is most likely a viral illness.  Highly suspicious for Covid. Covid swab sent for testing with labs pending. Make sure you go home and quarantine until we get your results Sending some  medications to the pharmacy to help with your symptoms Follow up as needed for continued or worsening symptoms

## 2019-11-05 NOTE — ED Notes (Signed)
Bed: UC07 Expected date:  Expected time:  Means of arrival:  Comments: Hold for covid appt

## 2019-11-06 LAB — NOVEL CORONAVIRUS, NAA (HOSP ORDER, SEND-OUT TO REF LAB; TAT 18-24 HRS): SARS-CoV-2, NAA: NOT DETECTED

## 2019-12-07 ENCOUNTER — Emergency Department (HOSPITAL_COMMUNITY)
Admission: EM | Admit: 2019-12-07 | Discharge: 2019-12-08 | Disposition: A | Discharge status: Home / Self Care | Payer: Medicaid Other | Attending: Emergency Medicine | Admitting: Emergency Medicine

## 2019-12-07 ENCOUNTER — Ambulatory Visit (HOSPITAL_COMMUNITY)
Admission: EM | Admit: 2019-12-07 | Discharge: 2019-12-07 | Disposition: A | Discharge status: Home / Self Care | Payer: Medicaid Other | Attending: Emergency Medicine | Admitting: Emergency Medicine

## 2019-12-07 ENCOUNTER — Encounter (HOSPITAL_COMMUNITY): Payer: Self-pay | Admitting: Emergency Medicine

## 2019-12-07 ENCOUNTER — Encounter (HOSPITAL_COMMUNITY): Payer: Self-pay

## 2019-12-07 ENCOUNTER — Other Ambulatory Visit: Payer: Self-pay

## 2019-12-07 DIAGNOSIS — M62830 Muscle spasm of back: Secondary | ICD-10-CM | POA: Diagnosis present

## 2019-12-07 DIAGNOSIS — M5441 Lumbago with sciatica, right side: Secondary | ICD-10-CM

## 2019-12-07 DIAGNOSIS — Z5321 Procedure and treatment not carried out due to patient leaving prior to being seen by health care provider: Secondary | ICD-10-CM | POA: Insufficient documentation

## 2019-12-07 NOTE — ED Provider Notes (Signed)
HPI  SUBJECTIVE:  Susan Walton is a 39 y.o. female who presents with 2 days of constant midline low back pain with radiation down both of her legs.  She describes the pain as spasm and as a "sword turning in my back".  She reports bilateral lower extremity weakness worse in the right than the left.  States that she has "no control over her right leg".  States that she was "paralyzed last night".  States that she had an episode where she was close to losing control of her bowels last night did not completely lose control of them.  She denies urinary incontinence, urinary retention.  She reports saddle anesthesia last night and states that the pain is worse at night and is waking her out of her sleep.  She denies fevers, trauma, unintentional weight loss.  States her gait feels unsteady and is requesting a cane.  She states that she has been working more recently.  She works at [employer] and does a lot of lifting and twisting.  No urinary symptoms.   She tried ice and 2 tabs of Aleve twice a day.  States that the Aleve helped.  Symptoms are worse with activity.  She has a past medical history of hypertension has not taken her labetalol in 2 weeks.  No history of chronic kidney disease GI bleed back problems diabetes IV drug use multiple myeloma/cancer, known aortic abdominal aneurysm.  LMP: 2/19-2/24.  Denies the possibility being pregnant.  PMD: Health department.  Past Medical History:  Diagnosis Date  . Anemia   . Fibroid   . Hypertension   . Retinal detachment   . Thyroid disease    Hypothyroidism    Past Surgical History:  Procedure Laterality Date  . DILATION AND CURETTAGE OF UTERUS    . EYE SURGERY    . TUBAL LIGATION N/A 12/11/2017   Procedure: POST PARTUM TUBAL LIGATION;  Surgeon: [name], MD;  Location: [facility];  Service: Gynecology;  Laterality: N/A;    Family History  Problem Relation Age of Onset  . COPD Maternal Grandfather   . Liver disease Paternal  Grandmother   . Liver disease Paternal Grandfather     Social History   Tobacco Use  . Smoking status: Former Smoker    Packs/day: 0.50    Types: Cigarettes  . Smokeless tobacco: Never Used  Substance Use Topics  . Alcohol use: Not Currently  . Drug use: Not Currently    Types: Marijuana, Cocaine    No current facility-administered medications for this encounter.  Current Outpatient Medications:  .  cetirizine-pseudoephedrine (ZYRTEC-D) 5-120 MG tablet, Take 1 tablet by mouth daily., Disp: 30 tablet, Rfl: 0 .  chlorhexidine (PERIDEX) 0.12 % solution, Use as directed 15 mLs in the mouth or throat 2 (two) times daily., Disp: 120 mL, Rfl: 0 .  guaiFENesin (MUCINEX) 600 MG 12 hr tablet, Take 1 tablet (600 mg total) by mouth 2 (two) times daily., Disp: 15 tablet, Rfl: 0 .  ibuprofen (ADVIL,MOTRIN) 800 MG tablet, Take 1 tablet (800 mg total) by mouth 3 (three) times daily., Disp: 21 tablet, Rfl: 0 .  labetalol (NORMODYNE) 100 MG tablet, Take 1 tablet (100 mg total) by mouth 2 (two) times daily., Disp: 30 tablet, Rfl: 0 .  lidocaine (XYLOCAINE) 2 % solution, Use as directed 15 mLs in the mouth or throat every 6 (six) hours as needed for mouth pain., Disp: 100 mL, Rfl: 0 .  naproxen (NAPROSYN) 500 MG tablet, Take 1  tablet (500 mg total) by mouth 2 (two) times daily., Disp: 30 tablet, Rfl: 0 .  PRESCRIPTION MEDICATION, Pt has Rx for Flagyl called in to pharmacy for BV - has not picked up Rx yet, Disp: , Rfl:   No Known Allergies   ROS  As noted in HPI.   Physical Exam  BP (!) 177/101 (BP Location: Left Arm) Comment: didnt take blood pressure medication  Pulse 81   Temp 98.1 F (36.7 C) (Oral)   Resp 17   LMP 11/04/2019   SpO2 99%   BP Readings from Last 3 Encounters:  12/07/19 (!) 177/101  11/05/19 (!) 172/107  10/12/19 (!) 173/109    Constitutional: Well developed, well nourished, no acute distress Eyes:  EOMI, conjunctiva normal bilaterally HENT: Normocephalic,  atraumatic,mucus membranes moist Respiratory: Normal inspiratory effort Cardiovascular: Normal rate GI: nondistended. No suprapubic, flank tenderness skin: No rash, skin intact Musculoskeletal: no CVAT. + Bilateral paralumbar tenderness, no appreciable muscle spasm.  Positive bony L-spine tenderness. Bilateral lower extremities nontender, baseline ROM with intact DP pulses. No pain with int/ext rotation flex/extension hips bilaterally. SLR positive right side. Sensation baseline light touch bilaterally for Pt, DTR's symmetric and intact bilaterally KJ, Motor symmetric bilateral 5/5 hip flexion, quadriceps, hamstrings, EHL, foot dorsiflexion, foot plantarflexion, patient able to ambulate on her own.  Gait somewhat antalgic but without apparent new ataxia. Neurologic: Alert & oriented x 3, no focal neuro deficits Psychiatric: Speech and behavior appropriate   ED Course   Medications - No data to display  No orders of the defined types were placed in this encounter.   No results found for this or any previous visit (from the past 24 hour(s)). No results found.  ED Clinical Impression  1. Acute midline low back pain with right-sided sciatica     ED Assessment/Plan  Patient has multiple red flags on history concerning for cauda equina, although her physical exam is significant only for midline and bilateral lower back tenderness.  Her gait is antalgic but without any noticeable ataxia.  Sending down to the ED for comprehensive evaluation, suspect that she may need more advanced imaging then plain films.  I feel the patient is stable to go via wheelchair.  She agrees to go to the ED.  discussed medical decision making rationale for transfer to the emergency department.  She agrees with plan.  Blood pressure noted.  She has been hypertensive for this in the past 3 visits.  States that she has not taken her blood pressure medicines in 2 weeks although she has a refill of labetalol at home.   She otherwise has no other complaints.  No orders of the defined types were placed in this encounter.   *This clinic note was created using Dragon dictation software. Therefore, there may be occasional mistakes despite careful proofreading.  ?    Melynda Ripple, MD 12/07/19 1747

## 2019-12-07 NOTE — ED Notes (Signed)
Pt called for VS, Pt is sitting outside

## 2019-12-07 NOTE — ED Triage Notes (Signed)
Pt presents with recurrent severe back spasms X 2 days.

## 2019-12-07 NOTE — Discharge Instructions (Addendum)
I am concerned that you are having an emergent cause of your back pain that is going to require advanced imaging, more than plain films.  I suggest that you go immediately to the emergency department for evaluation.

## 2019-12-07 NOTE — ED Triage Notes (Signed)
Pt in POV from [facility]. Sent for further eval of back spasms X2 days. Sent for further imaging that could not be done at [facility]. Reports numbness/tingling BLE. Also noted to be hypertensive. States she does not take BP meds.

## 2019-12-08 NOTE — ED Notes (Signed)
Pt is no longer in the lobby or seen outside the lobby

## 2019-12-08 NOTE — ED Notes (Signed)
Pt called x 3 , no answer

## 2020-01-04 ENCOUNTER — Telehealth: Payer: Self-pay | Admitting: *Deleted

## 2020-01-04 NOTE — Telephone Encounter (Signed)
Pt's advocate Lauro Regulus was contacted as pt had an appointment at [facility] 11:00 to meet with writer. Patient was not at appointment. Advocate reports she will reschedule when client is located.

## 2020-01-13 ENCOUNTER — Other Ambulatory Visit: Payer: Self-pay

## 2020-01-13 DIAGNOSIS — Z20822 Contact with and (suspected) exposure to covid-19: Secondary | ICD-10-CM

## 2020-01-14 LAB — NOVEL CORONAVIRUS, NAA: SARS-CoV-2, NAA: NOT DETECTED

## 2020-01-14 LAB — SARS-COV-2, NAA 2 DAY TAT

## 2020-01-30 ENCOUNTER — Other Ambulatory Visit: Payer: Self-pay

## 2020-01-30 DIAGNOSIS — Z20822 Contact with and (suspected) exposure to covid-19: Secondary | ICD-10-CM

## 2020-01-31 LAB — SARS-COV-2, NAA 2 DAY TAT

## 2020-01-31 LAB — NOVEL CORONAVIRUS, NAA: SARS-CoV-2, NAA: NOT DETECTED

## 2020-03-31 ENCOUNTER — Ambulatory Visit: Payer: Medicaid Other | Attending: Internal Medicine

## 2020-03-31 ENCOUNTER — Other Ambulatory Visit: Payer: Self-pay

## 2020-03-31 DIAGNOSIS — Z23 Encounter for immunization: Secondary | ICD-10-CM

## 2020-03-31 NOTE — Progress Notes (Signed)
   Covid-19 Vaccination Clinic  Name:  Susan Walton    MRN: [id number] DOB: Feb 21, 1981  03/31/2020  Susan Walton was observed post Covid-19 immunization for 15 minutes without incident. She was provided with Vaccine Information Sheet and instruction to access the V-Safe system.   Susan Walton was instructed to call 911 with any severe reactions post vaccine: Marland Kitchen Difficulty breathing  . Swelling of face and throat  . A fast heartbeat  . A bad rash all over body  . Dizziness and weakness   Immunizations Administered    Name Date Dose VIS Date Route   JANSSEN COVID-19 VACCINE 03/31/2020  9:45 AM 0.5 mL 11/12/2019 Intramuscular   Manufacturer: Alphonsa Overall   Lot: [id number]   [place]: [id number]

## 2020-04-03 ENCOUNTER — Telehealth: Payer: Self-pay | Admitting: *Deleted

## 2020-04-03 NOTE — Telephone Encounter (Signed)
Communicated with advocate, L. Apple about setting up appointment with Ms. Venables.  L. Apple states that patient requests meeting with nurse. Will follow up with advocate to arrange time convenient for patient.

## 2020-04-05 ENCOUNTER — Encounter: Payer: Self-pay | Admitting: *Deleted

## 2020-04-05 NOTE — Congregational Nurse Program (Signed)
  Dept: [facility] Nurse Program Note  Date of Encounter: 04/05/2020  Past Medical History: Past Medical History:  Diagnosis Date  . Anemia   . Fibroid   . Hypertension   . Retinal detachment   . Thyroid disease    Hypothyroidism    Encounter Details:  CNP Questionnaire - 04/05/20 1722      Questionnaire   Patient Status Not Applicable    Race Black or African American    Location Patient Served At [employer]    Uninsured Not Applicable    Food No food insecurities    Housing/Utilities No permanent housing    Transportation No transportation needs    Interpersonal Safety Yes, feel physically and emotionally safe where you currently live    Medication No medication insecurities    Medical Provider Yes    Referrals Other    ED Visit Averted Not Applicable    Life-Saving Intervention Made Not Applicable         Encounter:  Patient reports that she receives counseling at [facility] and has her next appointment on 04/16/20.  She expressed concern that the date is several weeks out so I offered to arrange other counseling, but she declined.  She expressed distress over receiving the exit letter from [facility] that requires she exit on 04/20/20.  She inquired about what services congregational nursing could provide.  Made her aware of some furniture donations available through CN office and the possibility of furniture availability through the furniture ministry at [employer]  Patient desires to email me when she gets housing and I will pursue furniture options for her at that time.

## 2020-06-21 ENCOUNTER — Other Ambulatory Visit: Payer: Medicaid Other

## 2020-06-21 ENCOUNTER — Other Ambulatory Visit: Payer: Self-pay

## 2020-06-21 DIAGNOSIS — Z20822 Contact with and (suspected) exposure to covid-19: Secondary | ICD-10-CM

## 2020-06-22 LAB — SARS-COV-2, NAA 2 DAY TAT

## 2020-06-22 LAB — NOVEL CORONAVIRUS, NAA: SARS-CoV-2, NAA: NOT DETECTED

## 2020-06-27 ENCOUNTER — Encounter: Payer: Self-pay | Admitting: *Deleted

## 2020-06-27 NOTE — Congregational Nurse Program (Signed)
  Dept: [facility] Nurse Program Note  Date of Encounter: 06/27/2020  Past Medical History: Past Medical History:  Diagnosis Date  . Anemia   . Fibroid   . Hypertension   . Retinal detachment   . Thyroid disease    Hypothyroidism    Encounter Details:  Patient reports she was treated at the [facility] in the last few weeks for gonorrhea, trichonosis  and bacterial vaginosis.  She prefers to get follow up with a [facility].  Patient reports that she had a visit today with [facility] and her blood pressure was 118/100.  She is concerned about her BP and wants to address that at an appointment

## 2020-07-04 ENCOUNTER — Telehealth: Payer: Self-pay | Admitting: *Deleted

## 2020-07-04 ENCOUNTER — Other Ambulatory Visit: Payer: Self-pay | Admitting: *Deleted

## 2020-07-04 NOTE — Telephone Encounter (Signed)
CN telephoned [name] to schedule appointment for client.  Appointment scheduled for October 25 @ 1:45 PM.  Address [address]Entr[address] - blue sign.  Patient is to bring list of medications, Medicaid card and $3.00 copay.  CN emailed patient and  patient's advocate - [name] to inform them of appointment.

## 2020-07-09 ENCOUNTER — Encounter: Payer: Medicaid Other | Admitting: Internal Medicine

## 2020-07-18 IMAGING — DX CHEST - 2 VIEW
2 series · 2 of 2 positions shown · non-contrast
Comparison: 01/22/2018

CLINICAL DATA: Acute chest pain and shortness of breath

EXAM:
CHEST - 2 VIEW

[chest pa]
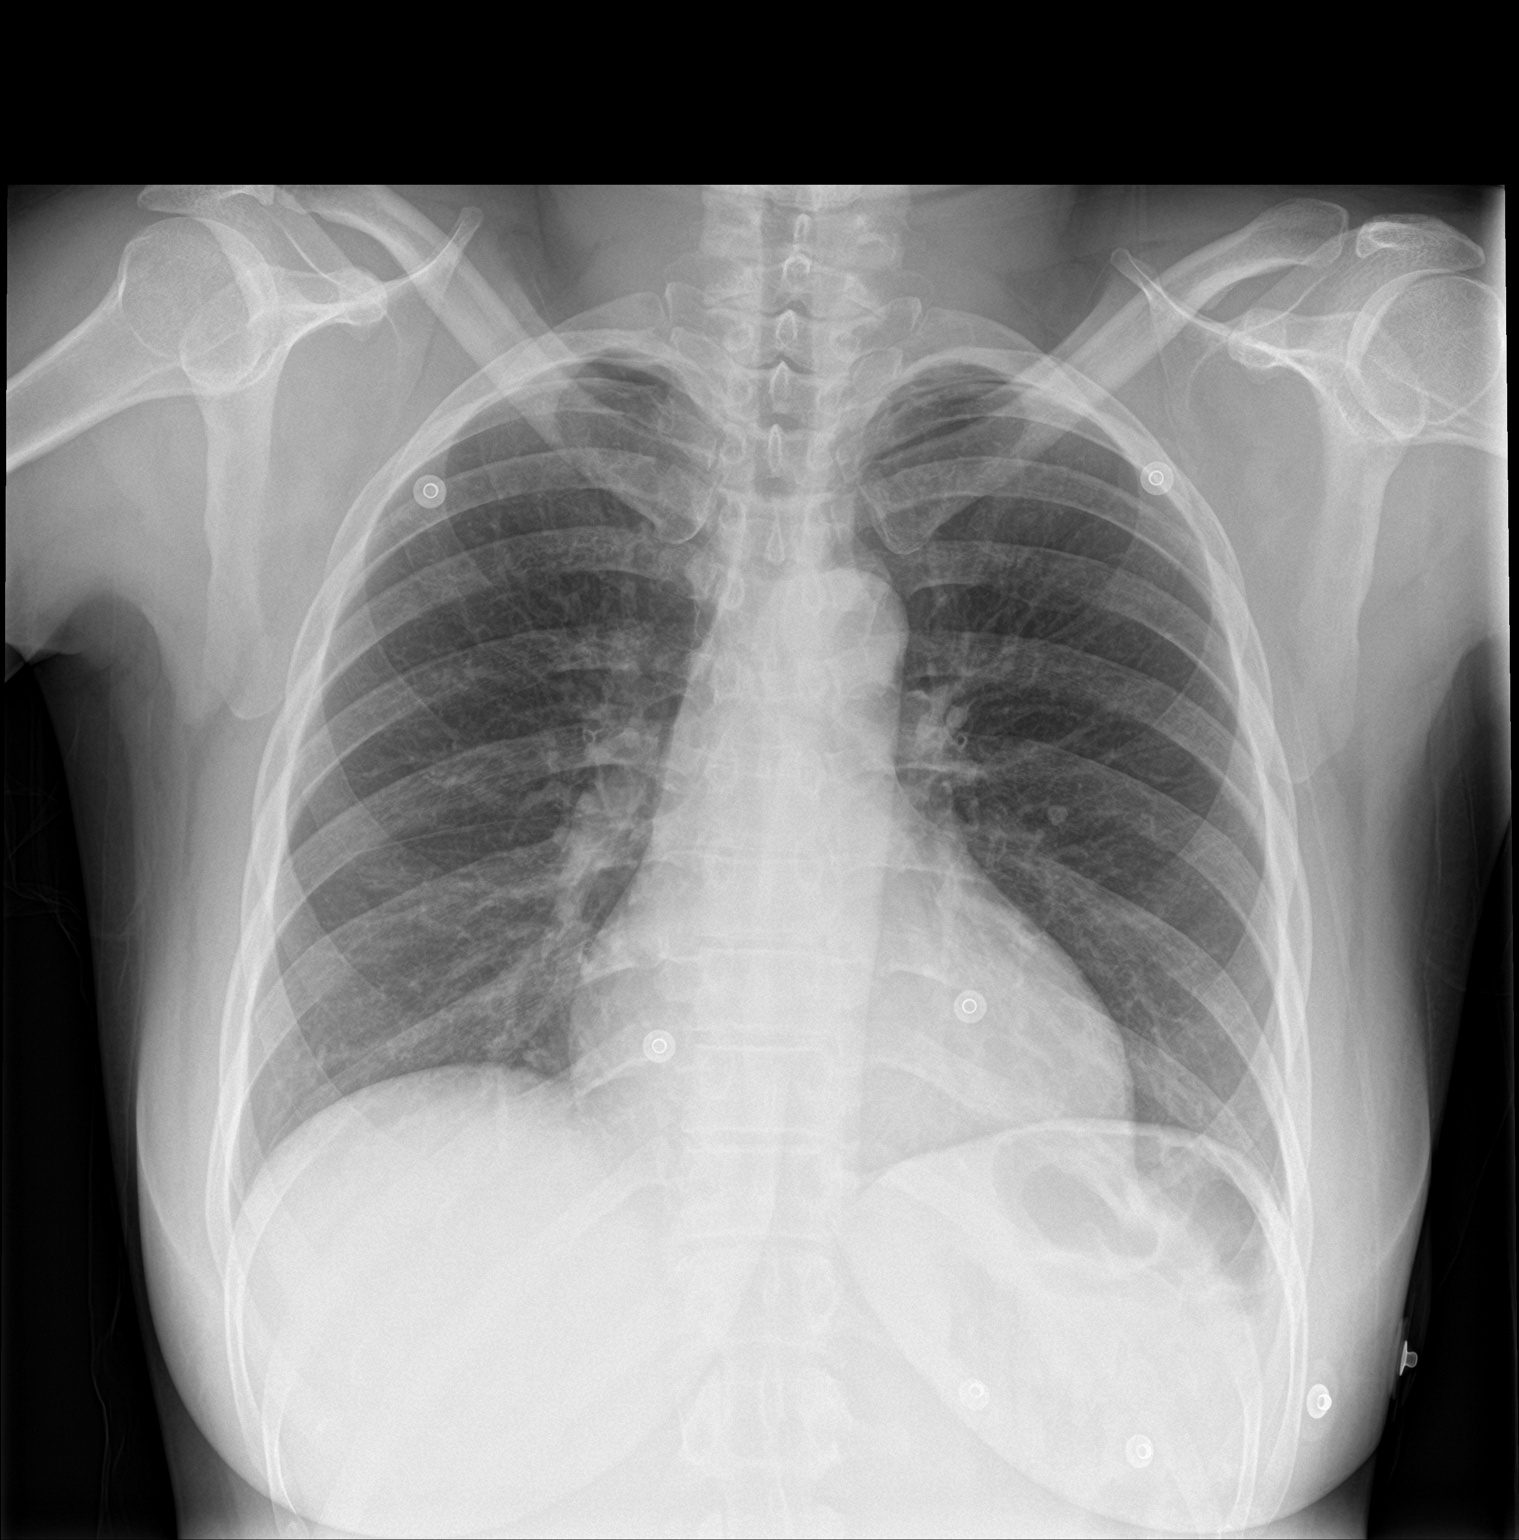

[chest lat]
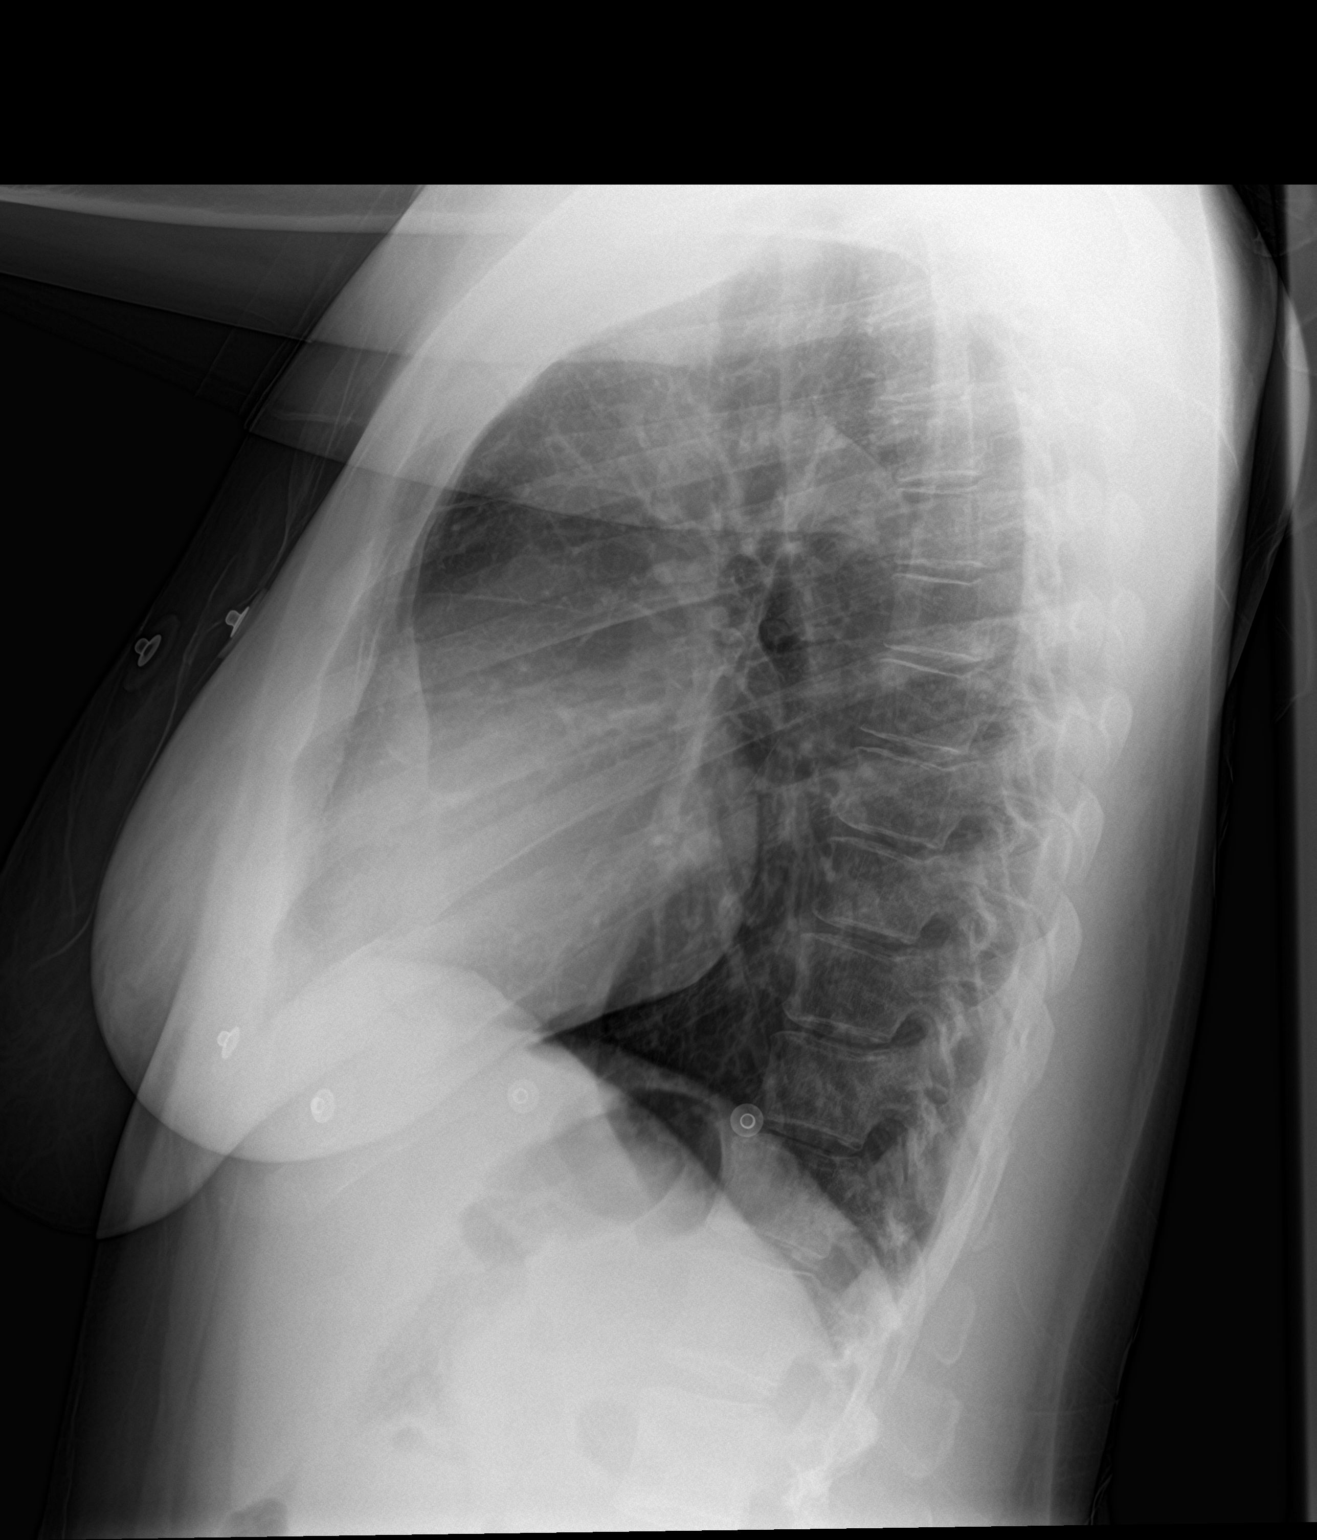

[2 of 2 positions shown; findings below may reference images not displayed]

FINDINGS: Mild cardiomegaly again noted.

There is no evidence of focal airspace disease, pulmonary edema,
suspicious pulmonary nodule/mass, pleural effusion, or pneumothorax.

No acute bony abnormalities are identified.
IMPRESSION: Mild cardiomegaly without evidence of acute cardiopulmonary disease.

## 2020-07-24 ENCOUNTER — Encounter: Payer: Medicaid Other | Admitting: Internal Medicine

## 2020-07-24 NOTE — Progress Notes (Deleted)
   CC: HTN, ***  HPI:  Susan Walton is a 39 y.o.   Past Medical History:  Diagnosis Date  . Anemia   . Fibroid   . Hypertension   . Retinal detachment   . Thyroid disease    Hypothyroidism   Review of Systems:  ***  Physical Exam:  There were no vitals filed for this visit. ***  Assessment & Plan:   See Encounters Tab for problem based charting.  Patient discussed with Dr. {NAMES:[id number]::"Butcher","Guilloud","Hoffman","Mullen","Narendra","Raines","Vincent"}
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
## 2020-08-22 ENCOUNTER — Encounter (HOSPITAL_COMMUNITY): Payer: Self-pay | Admitting: *Deleted

## 2020-08-22 ENCOUNTER — Emergency Department (HOSPITAL_COMMUNITY)
Admission: EM | Admit: 2020-08-22 | Discharge: 2020-08-22 | Disposition: A | Discharge status: Home / Self Care | Payer: Medicaid Other | Attending: Emergency Medicine | Admitting: Emergency Medicine

## 2020-08-22 ENCOUNTER — Other Ambulatory Visit: Payer: Self-pay

## 2020-08-22 DIAGNOSIS — Z20822 Contact with and (suspected) exposure to covid-19: Secondary | ICD-10-CM | POA: Diagnosis not present

## 2020-08-22 DIAGNOSIS — R059 Cough, unspecified: Secondary | ICD-10-CM | POA: Diagnosis not present

## 2020-08-22 DIAGNOSIS — R519 Headache, unspecified: Secondary | ICD-10-CM | POA: Insufficient documentation

## 2020-08-22 DIAGNOSIS — K011 Impacted teeth: Secondary | ICD-10-CM | POA: Diagnosis not present

## 2020-08-22 DIAGNOSIS — Z5321 Procedure and treatment not carried out due to patient leaving prior to being seen by health care provider: Secondary | ICD-10-CM | POA: Insufficient documentation

## 2020-08-22 DIAGNOSIS — R0981 Nasal congestion: Secondary | ICD-10-CM | POA: Diagnosis not present

## 2020-08-22 LAB — RESP PANEL BY RT-PCR (FLU A&B, COVID) ARPGX2
Influenza A by PCR: NEGATIVE
Influenza B by PCR: NEGATIVE
SARS Coronavirus 2 by RT PCR: NEGATIVE

## 2020-08-22 NOTE — ED Notes (Signed)
Called pt x2 for vitals, checked outside for pt. No response.

## 2020-08-22 NOTE — ED Notes (Signed)
Called pt x3 for vitals, no response. 

## 2020-08-22 NOTE — ED Triage Notes (Signed)
Stuffy nose, sneezing, coughing, headaches, "feels itchy" for two months. Also c/o impacted tooth lower left side.

## 2020-08-22 NOTE — ED Notes (Signed)
Pt says she was prescribed amlodipine for BP, but not taking.

## 2020-08-27 ENCOUNTER — Ambulatory Visit (HOSPITAL_COMMUNITY)
Admission: EM | Admit: 2020-08-27 | Discharge: 2020-08-27 | Disposition: A | Discharge status: Home / Self Care | Payer: Medicaid Other | Attending: Emergency Medicine | Admitting: Emergency Medicine

## 2020-08-27 ENCOUNTER — Encounter (HOSPITAL_COMMUNITY): Payer: Self-pay

## 2020-08-27 ENCOUNTER — Other Ambulatory Visit: Payer: Self-pay

## 2020-08-27 DIAGNOSIS — R030 Elevated blood-pressure reading, without diagnosis of hypertension: Secondary | ICD-10-CM

## 2020-08-27 DIAGNOSIS — Z113 Encounter for screening for infections with a predominantly sexual mode of transmission: Secondary | ICD-10-CM | POA: Diagnosis not present

## 2020-08-27 DIAGNOSIS — J0100 Acute maxillary sinusitis, unspecified: Secondary | ICD-10-CM

## 2020-08-27 LAB — POCT URINALYSIS DIPSTICK, ED / UC
Bilirubin Urine: NEGATIVE
Glucose, UA: NEGATIVE mg/dL
Ketones, ur: 15 mg/dL — AB
Nitrite: NEGATIVE
Protein, ur: 30 mg/dL — AB
Specific Gravity, Urine: >=1.03 (ref 1.005–1.030)
Urobilinogen, UA: 0.2 mg/dL (ref 0.0–1.0)
pH: 6 (ref 5.0–8.0)

## 2020-08-27 MED ORDER — AMOXICILLIN 875 MG PO TABS: 875.0000 mg | ORAL_TABLET | Freq: Two times a day (BID) | ORAL | 0 refills | Status: AC

## 2020-08-27 NOTE — ED Notes (Signed)
Pt called for in lobby, no response

## 2020-08-27 NOTE — Discharge Instructions (Addendum)
Take the amoxicillin as directed.    Your blood pressure is elevated today at 189/123 and 164/117.  Please have this rechecked by your primary care provider in 1 week.

## 2020-08-27 NOTE — ED Provider Notes (Signed)
[place]    CSN: [id number] Arrival date & time: 08/27/20  2458      History   Chief Complaint Chief Complaint  Patient presents with  . Cough  . Nasal Congestion       . loss of taste    HPI Susan Walton is a 39 y.o. female.  Patient presents with 1 month history of runny nose, congestion, cough productive of yellow phlegm, sneezing, nausea, loss of taste and smell. Treatment attempted at home with loratadine. She denies fever, chills, rash, sore throat, vomiting, diarrhea, or other symptoms. Respiratory panel negative for COVID and flu on 08/22/2020. Patient also request STD testing. She denies abdominal pain, dysuria, back pain, vaginal discharge, pelvic pain, rash, lesions, or other symptoms.  Her medical history includes hypertension, hypothyroidism, anemia, retinal detachment, trichomonas, gonorrhea, drug abuse during pregnancy, adjustment disorder with depressed mood. Patient is not taking her blood pressure medication.   HPI  Past Medical History:  Diagnosis Date  . Anemia   . Fibroid   . Hypertension   . Retinal detachment   . Thyroid disease    Hypothyroidism    Patient Active Problem List   Diagnosis Date Noted  . Group B Streptococcus carrier, +RV culture, currently pregnant 12/10/2017  . Gonorrhea affecting pregnancy in first trimester 12/10/2017  . 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 Pregnancy, supervision, high-risk 07/27/2017  . Chronic hypertension with superimposed severe preeclampsia 07/27/2017  . Drug abuse during pregnancy ([place]) 07/27/2017  . Thyroid dysfunction, antepartum 07/27/2017  . History of trichomoniasis 07/27/2017  . Unwanted fertility 07/27/2017  . Advanced maternal age in multigravida, second trimester   . Adjustment disorder with depressed mood 06/05/2017    Past Surgical History:  Procedure Laterality Date  . DILATION AND CURETTAGE OF UTERUS    . EYE SURGERY    . TUBAL  LIGATION N/A 12/11/2017   Procedure: POST PARTUM TUBAL LIGATION;  Surgeon: [name], MD;  Location: [facility];  Service: Gynecology;  Laterality: N/A;    OB History    Gravida  8   Para  5   Term  5   Preterm  0   AB  3   Living  5     SAB  2   IAB  1   Ectopic  0   Multiple  0   Live Births  5            Home Medications    Prior to Admission medications   Medication Sig Start Date End Date Taking? Authorizing Provider  amLODipine (NORVASC) 5 MG tablet Take 5 mg by mouth daily. 07/12/20   [provider]  amoxicillin (AMOXIL) 875 MG tablet Take 1 tablet (875 mg total) by mouth 2 (two) times daily for 7 days. 08/27/20 09/03/20  Sharion Balloon, NP  cetirizine-pseudoephedrine (ZYRTEC-D) 5-120 MG tablet Take 1 tablet by mouth daily. 11/05/19   Loura Halt A, NP  chlorhexidine (PERIDEX) 0.12 % solution Use as directed 15 mLs in the mouth or throat 2 (two) times daily. 10/12/19   Wieters, Hallie C, PA-C  guaiFENesin (MUCINEX) 600 MG 12 hr tablet Take 1 tablet (600 mg total) by mouth 2 (two) times daily. 11/05/19   Loura Halt A, NP  ibuprofen (ADVIL,MOTRIN) 800 MG tablet Take 1 tablet (800 mg total) by mouth 3 (three) times daily. 09/11/18   Tereasa Coop, PA-C  labetalol (NORMODYNE) 100 MG tablet Take 1 tablet (100 mg total)  by mouth 2 (two) times daily. 05/24/19   Jaynee Eagles, PA-C  lidocaine (XYLOCAINE) 2 % solution Use as directed 15 mLs in the mouth or throat every 6 (six) hours as needed for mouth pain. 10/12/19   Wieters, Hallie C, PA-C  naproxen (NAPROSYN) 500 MG tablet Take 1 tablet (500 mg total) by mouth 2 (two) times daily. 05/24/19   Jaynee Eagles, PA-C  PRESCRIPTION MEDICATION Pt has Rx for Flagyl called in to pharmacy for BV - has not picked up Rx yet    [provider]  NIFEdipine (PROCARDIA-XL/ADALAT CC) 60 MG 24 hr tablet Take 1 tablet (60 mg total) by mouth 2 (two) times daily. Patient not taking: Reported on 03/28/2019 12/13/17  05/24/19  Osborne Oman, MD  omeprazole (PRILOSEC) 40 MG capsule Take 1 capsule (40 mg total) by mouth daily. 30 minutes before breakfast Patient not taking: Reported on 03/28/2019 05/12/18 05/24/19  Mauri Pole, MD    Family History Family History  Problem Relation Age of Onset  . COPD Maternal Grandfather   . Liver disease Paternal Grandmother   . Liver disease Paternal Grandfather     Social History Social History   Tobacco Use  . Smoking status: Former Smoker    Packs/day: 0.50    Types: Cigarettes  . Smokeless tobacco: Never Used  Vaping Use  . Vaping Use: Never used  Substance Use Topics  . Alcohol use: Not Currently  . Drug use: Not Currently    Types: Marijuana, Cocaine     Allergies   Patient has no known allergies.   Review of Systems Review of Systems  Constitutional: Negative for chills and fever.  HENT: Positive for congestion, postnasal drip, rhinorrhea and sinus pressure. Negative for ear pain and sore throat.   Eyes: Negative for pain and visual disturbance.  Respiratory: Positive for cough. Negative for shortness of breath.   Cardiovascular: Negative for chest pain and palpitations.  Gastrointestinal: Negative for abdominal pain and vomiting.  Genitourinary: Negative for dysuria, flank pain, hematuria, pelvic pain and vaginal discharge.  Musculoskeletal: Negative for arthralgias and back pain.  Skin: Negative for color change and rash.  Neurological: Negative for seizures and syncope.  All other systems reviewed and are negative.    Physical Exam Triage Vital Signs ED Triage Vitals  Enc Vitals Group     BP 08/27/20 0917 (!) 189/123     Pulse Rate 08/27/20 0917 75     Resp 08/27/20 0917 19     Temp 08/27/20 0917 98.5 F (36.9 C)     Temp Source 08/27/20 0917 Oral     SpO2 08/27/20 0917 100 %     Weight --      Height --      Head Circumference --      Peak Flow --      Pain Score 08/27/20 0914 0     Pain Loc --      Pain Edu? --       Excl. in [place]? --    No data found.  Updated Vital Signs BP (!) 164/117 (BP Location: Right Arm)   Pulse 75   Temp 98.5 F (36.9 C) (Oral)   Resp 19   LMP 08/17/2020   SpO2 100%   Visual Acuity Right Eye Distance:   Left Eye Distance:   Bilateral Distance:    Right Eye Near:   Left Eye Near:    Bilateral Near:     Physical Exam Vitals and nursing  note reviewed.  Constitutional:      General: She is not in acute distress.    Appearance: She is well-developed and well-nourished.  HENT:     Head: Normocephalic and atraumatic.     Right Ear: Tympanic membrane normal.     Left Ear: Tympanic membrane normal.     Nose: Congestion and rhinorrhea present.     Mouth/Throat:     Mouth: Mucous membranes are moist.     Pharynx: Oropharynx is clear.  Eyes:     Conjunctiva/sclera: Conjunctivae normal.  Cardiovascular:     Rate and Rhythm: Normal rate and regular rhythm.     Heart sounds: Normal heart sounds.  Pulmonary:     Effort: Pulmonary effort is normal. No respiratory distress.     Breath sounds: Normal breath sounds.  Abdominal:     Palpations: Abdomen is soft.     Tenderness: There is no abdominal tenderness.  Musculoskeletal:        General: No edema.     Cervical back: Neck supple.  Skin:    General: Skin is warm and dry.     Findings: No rash.  Neurological:     General: No focal deficit present.     Mental Status: She is alert and oriented to person, place, and time.     Gait: Gait normal.  Psychiatric:        Mood and Affect: Mood and affect and mood normal.        Behavior: Behavior normal.      UC Treatments / Results  Labs (all labs ordered are listed, but only abnormal results are displayed) Labs Reviewed  POCT URINALYSIS DIPSTICK, ED / UC - Abnormal; Notable for the following components:      Result Value   Ketones, ur 15 (*)    Hgb urine dipstick TRACE (*)    Protein, ur 30 (*)    Leukocytes,Ua SMALL (*)    All other components within  normal limits  CERVICOVAGINAL ANCILLARY ONLY    EKG   Radiology No results found.  Procedures Procedures (including critical care time)  Medications Ordered in UC Medications - No data to display  Initial Impression / Assessment and Plan / UC Course  I have reviewed the triage vital signs and the nursing notes.  Pertinent labs & imaging results that were available during my care of the patient were reviewed by me and considered in my medical decision making (see chart for details).   Acute sinusitis.  Screening for STDs.  Elevated blood pressure reading.  Patient had negative COVID and flu 5 days ago.  Treating sinusitis with amoxicillin.  Patient obtained vaginal self swab for testing.  Instructed her to abstain from sexual activity until the test results are back.  Discussed that she and her sexual partners may require treatment at that time.  Discussed with patient that her blood pressure is elevated today and needs to be rechecked by her PCP in 1 week.  She agrees to plan of care.   Final Clinical Impressions(s) / UC Diagnoses   Final diagnoses:  Acute non-recurrent maxillary sinusitis  Screening for STD (sexually transmitted disease)  Elevated blood pressure reading     Discharge Instructions     Take the amoxicillin as directed.    Your blood pressure is elevated today at 189/123 and 164/117.  Please have this rechecked by your primary care provider in 1 week.           ED Prescriptions  Medication Sig Dispense Auth. Provider   amoxicillin (AMOXIL) 875 MG tablet Take 1 tablet (875 mg total) by mouth 2 (two) times daily for 7 days. 14 tablet Sharion Balloon, NP     PDMP not reviewed this encounter.   Sharion Balloon, NP 08/27/20 [phone]

## 2020-08-27 NOTE — ED Triage Notes (Addendum)
Pt in with c/o nausea, productive cough, sneezing, loss of taste/smell and runny nose that has been going on for over 1 month.  Pt has been taking loratidine for sxs  Denies fever Was tested for covid/flu  last week with negative results  Also requesting Std testing. States that she had burning when urinating  Denies vaginal discharge

## 2020-08-28 ENCOUNTER — Telehealth (HOSPITAL_COMMUNITY): Payer: Self-pay | Admitting: Emergency Medicine

## 2020-08-28 LAB — CERVICOVAGINAL ANCILLARY ONLY
Bacterial Vaginitis (gardnerella): POSITIVE — AB
Candida Glabrata: NEGATIVE
Candida Vaginitis: NEGATIVE
Chlamydia: NEGATIVE
Comment: NEGATIVE
Comment: NEGATIVE
Comment: NEGATIVE
Comment: NEGATIVE
Comment: NEGATIVE
Comment: NORMAL
Neisseria Gonorrhea: NEGATIVE
Trichomonas: POSITIVE — AB

## 2020-08-28 MED ORDER — METRONIDAZOLE 500 MG PO TABS: 500.0000 mg | ORAL_TABLET | Freq: Two times a day (BID) | ORAL | 0 refills | Status: DC

## 2020-11-13 ENCOUNTER — Encounter (HOSPITAL_COMMUNITY): Payer: Self-pay | Admitting: Emergency Medicine

## 2020-11-13 ENCOUNTER — Other Ambulatory Visit: Payer: Self-pay

## 2020-11-13 ENCOUNTER — Ambulatory Visit (HOSPITAL_COMMUNITY)
Admission: EM | Admit: 2020-11-13 | Discharge: 2020-11-13 | Disposition: A | Discharge status: Home / Self Care | Payer: Medicaid Other | Attending: Internal Medicine | Admitting: Internal Medicine

## 2020-11-13 DIAGNOSIS — J45901 Unspecified asthma with (acute) exacerbation: Secondary | ICD-10-CM

## 2020-11-13 MED ORDER — ALBUTEROL SULFATE HFA 108 (90 BASE) MCG/ACT IN AERS: 1.0000 | INHALATION_SPRAY | Freq: Four times a day (QID) | RESPIRATORY_TRACT | 0 refills | Status: DC | PRN

## 2020-11-13 MED ORDER — DEXAMETHASONE SODIUM PHOSPHATE 10 MG/ML IJ SOLN
10.0000 mg | Freq: Once | INTRAMUSCULAR | Status: AC
  Administered 2020-11-13: 10 mg via INTRAMUSCULAR

## 2020-11-13 MED ORDER — AEROCHAMBER PLUS FLO-VU MEDIUM MISC
1.0000 | Freq: Once | Status: AC
  Administered 2020-11-13: 1

## 2020-11-13 MED ORDER — DEXAMETHASONE SODIUM PHOSPHATE 10 MG/ML IJ SOLN
INTRAMUSCULAR | Status: AC
  Filled 2020-11-13: qty 1

## 2020-11-13 MED ORDER — ALBUTEROL SULFATE HFA 108 (90 BASE) MCG/ACT IN AERS
INHALATION_SPRAY | RESPIRATORY_TRACT | Status: AC
  Filled 2020-11-13: qty 6.7

## 2020-11-13 MED ORDER — ALBUTEROL SULFATE HFA 108 (90 BASE) MCG/ACT IN AERS
2.0000 | INHALATION_SPRAY | Freq: Once | RESPIRATORY_TRACT | Status: AC
  Administered 2020-11-13: 2 via RESPIRATORY_TRACT

## 2020-11-13 MED ORDER — AEROCHAMBER PLUS FLO-VU LARGE MISC
Status: AC
  Filled 2020-11-13: qty 1

## 2020-11-13 MED ORDER — OLOPATADINE HCL 0.1 % OP SOLN: 1.0000 [drp] | Freq: Two times a day (BID) | OPHTHALMIC | 12 refills | Status: DC

## 2020-11-13 MED ORDER — PREDNISONE 20 MG PO TABS: 20.0000 mg | ORAL_TABLET | Freq: Every day | ORAL | 0 refills | Status: AC

## 2020-11-13 NOTE — ED Triage Notes (Signed)
patient says she has been up all night coughing.  Patient has had a cough and runny nose, hot and cold episodes, and nausea today.

## 2020-11-13 NOTE — ED Provider Notes (Signed)
[place]    CSN: [id number] Arrival date & time: 11/13/20  1501      History   Chief Complaint Chief Complaint  Patient presents with  . Cough    HPI Susan Walton is a 40 y.o. female comes to the urgent care with 4-day history of runny nose, itchy nose, itchy red eyes, shortness of breath and wheezing.  Patient brought a puppy home 4 days ago.  Within a short period of time after patient brought the puppy home she started having above-mentioned symptoms.  Patient has chest tightness, coughing and wheezing.  No chest pain or chest pressure.  No nausea, vomiting or diarrhea.  No rash on the skin.  No blurry vision.   Patient has generalized itching.  HPI  Past Medical History:  Diagnosis Date  . Anemia   . Fibroid   . Hypertension   . Retinal detachment   . Thyroid disease    Hypothyroidism    Patient Active Problem List   Diagnosis Date Noted  . Group B Streptococcus carrier, +RV culture, currently pregnant 12/10/2017  . Gonorrhea affecting pregnancy in first trimester 12/10/2017  . 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 Pregnancy, supervision, high-risk 07/27/2017  . Chronic hypertension with superimposed severe preeclampsia 07/27/2017  . Drug abuse during pregnancy ([place]) 07/27/2017  . Thyroid dysfunction, antepartum 07/27/2017  . History of trichomoniasis 07/27/2017  . Unwanted fertility 07/27/2017  . Advanced maternal age in multigravida, second trimester   . Adjustment disorder with depressed mood 06/05/2017    Past Surgical History:  Procedure Laterality Date  . DILATION AND CURETTAGE OF UTERUS    . EYE SURGERY    . TUBAL LIGATION N/A 12/11/2017   Procedure: POST PARTUM TUBAL LIGATION;  Surgeon: [name], MD;  Location: [facility];  Service: Gynecology;  Laterality: N/A;    OB History    Gravida  8   Para  5   Term  5   Preterm  0   AB  3   Living  5     SAB  2   IAB  1    Ectopic  0   Multiple  0   Live Births  5            Home Medications    Prior to Admission medications   Medication Sig Start Date End Date Taking? Authorizing Provider  albuterol (VENTOLIN HFA) 108 (90 Base) MCG/ACT inhaler Inhale 1-2 puffs into the lungs every 6 (six) hours as needed for wheezing or shortness of breath. 11/13/20  Yes Barbette Mcglaun, Myrene Galas, MD  amLODipine (NORVASC) 5 MG tablet Take 5 mg by mouth daily. 07/12/20  Yes [provider]  ibuprofen (ADVIL,MOTRIN) 800 MG tablet Take 1 tablet (800 mg total) by mouth 3 (three) times daily. 09/11/18  Yes Tereasa Coop, PA-C  olopatadine (PATANOL) 0.1 % ophthalmic solution Place 1 drop into both eyes 2 (two) times daily. 11/13/20  Yes Meshilem Machuca, Myrene Galas, MD  predniSONE (DELTASONE) 20 MG tablet Take 1 tablet (20 mg total) by mouth daily for 5 days. 11/13/20 11/18/20 Yes Tiah Heckel, Myrene Galas, MD  cetirizine-pseudoephedrine (ZYRTEC-D) 5-120 MG tablet Take 1 tablet by mouth daily. 11/05/19   Loura Halt A, NP  guaiFENesin (MUCINEX) 600 MG 12 hr tablet Take 1 tablet (600 mg total) by mouth 2 (two) times daily. 11/05/19   Loura Halt A, NP  labetalol (NORMODYNE) 100 MG tablet Take 1 tablet (100 mg total) by mouth  2 (two) times daily. 05/24/19 11/13/20  Jaynee Eagles, PA-C  NIFEdipine (PROCARDIA-XL/ADALAT CC) 60 MG 24 hr tablet Take 1 tablet (60 mg total) by mouth 2 (two) times daily. Patient not taking: Reported on 03/28/2019 12/13/17 05/24/19  Osborne Oman, MD  omeprazole (PRILOSEC) 40 MG capsule Take 1 capsule (40 mg total) by mouth daily. 30 minutes before breakfast Patient not taking: Reported on 03/28/2019 05/12/18 05/24/19  Mauri Pole, MD    Family History Family History  Problem Relation Age of Onset  . COPD Maternal Grandfather   . Liver disease Paternal Grandmother   . Liver disease Paternal Grandfather     Social History Social History   Tobacco Use  . Smoking status: Former Smoker    Packs/day: 0.50    Types:  Cigarettes  . Smokeless tobacco: Never Used  Vaping Use  . Vaping Use: Never used  Substance Use Topics  . Alcohol use: Not Currently  . Drug use: Not Currently    Types: Marijuana, Cocaine     Allergies   Patient has no known allergies.   Review of Systems Review of Systems  Constitutional: Negative.   HENT: Positive for congestion and postnasal drip. Negative for sore throat and voice change.   Eyes: Positive for itching.  Respiratory: Positive for shortness of breath and wheezing.   Cardiovascular: Negative for chest pain.  Gastrointestinal: Negative.   Genitourinary: Negative.   Skin: Negative.      Physical Exam Triage Vital Signs ED Triage Vitals  Enc Vitals Group     BP 11/13/20 1603 (!) 164/104     Pulse Rate 11/13/20 1603 (!) 111     Resp 11/13/20 1603 (!) 24     Temp 11/13/20 1603 98.5 F (36.9 C)     Temp Source 11/13/20 1603 Oral     SpO2 11/13/20 1603 93 %     Weight --      Height --      Head Circumference --      Peak Flow --      Pain Score 11/13/20 1559 10     Pain Loc --      Pain Edu? --      Excl. in [place]? --    No data found.  Updated Vital Signs BP (!) 164/104 (BP Location: Left Arm) Comment: has not had blood pressure medicine today  Pulse (!) 111   Temp 98.5 F (36.9 C) (Oral)   Resp (!) 24   LMP 11/06/2020   SpO2 93%   Visual Acuity Right Eye Distance:   Left Eye Distance:   Bilateral Distance:    Right Eye Near:   Left Eye Near:    Bilateral Near:     Physical Exam Vitals and nursing note reviewed.  Constitutional:      General: She is not in acute distress.    Appearance: She is not ill-appearing.  HENT:     Mouth/Throat:     Mouth: Mucous membranes are moist.  Cardiovascular:     Rate and Rhythm: Normal rate and regular rhythm.     Pulses: Normal pulses.     Heart sounds: Normal heart sounds.  Pulmonary:     Effort: Respiratory distress present.     Breath sounds: No stridor. Wheezing present. No rhonchi or  rales.  Chest:     Chest wall: No tenderness.  Abdominal:     General: Abdomen is flat. Bowel sounds are normal.  Musculoskeletal:  General: Normal range of motion.  Neurological:     Mental Status: She is alert.      UC Treatments / Results  Labs (all labs ordered are listed, but only abnormal results are displayed) Labs Reviewed - No data to display  EKG   Radiology No results found.  Procedures Procedures (including critical care time)  Medications Ordered in UC Medications  albuterol (VENTOLIN HFA) 108 (90 Base) MCG/ACT inhaler 2 puff (2 puffs Inhalation Given 11/13/20 1654)  AeroChamber Plus Flo-Vu Medium MISC 1 each (1 each Other Given 11/13/20 1653)  dexamethasone (DECADRON) injection 10 mg (10 mg Intramuscular Given 11/13/20 1654)    Initial Impression / Assessment and Plan / UC Course  I have reviewed the triage vital signs and the nursing notes.  Pertinent labs & imaging results that were available during my care of the patient were reviewed by me and considered in my medical decision making (see chart for details).     1.  Allergic reaction with bronchospasm and rhinoconjunctivitis: Offending agent is likely the puppy.  Patient is advised to return the puppy Albuterol inhaler with a spacer Prednisone 20 mg orally daily Patanol eyedrops If symptoms worsen patient is advised to return to urgent care Decadron 10 mg IM x1 dose. Final Clinical Impressions(s) / UC Diagnoses   Final diagnoses:  Allergic bronchitis with acute exacerbation     Discharge Instructions     Use medications as directed If shortness of breath worsens please return to urgent care to be reevaluated. I suspect that the symptoms you are having is a result of a new puppy.  I recommend you return the puppy until you are tested for allergies to dogs.  Your symptoms may take a few days before improving.   ED Prescriptions    Medication Sig Dispense Auth. Provider   olopatadine  (PATANOL) 0.1 % ophthalmic solution Place 1 drop into both eyes 2 (two) times daily. 5 mL Masayoshi Couzens, Myrene Galas, MD   predniSONE (DELTASONE) 20 MG tablet Take 1 tablet (20 mg total) by mouth daily for 5 days. 5 tablet Jesseca Marsch, Myrene Galas, MD   albuterol (VENTOLIN HFA) 108 (90 Base) MCG/ACT inhaler Inhale 1-2 puffs into the lungs every 6 (six) hours as needed for wheezing or shortness of breath. 18 g Joshuan Bolander, Myrene Galas, MD     PDMP not reviewed this encounter.   Chase Picket, MD 11/13/20 [phone]

## 2020-11-13 NOTE — Discharge Instructions (Addendum)
Use medications as directed If shortness of breath worsens please return to urgent care to be reevaluated. I suspect that the symptoms you are having is a result of a new puppy.  I recommend you return the puppy until you are tested for allergies to dogs.  Your symptoms may take a few days before improving.

## 2020-11-16 ENCOUNTER — Encounter (INDEPENDENT_AMBULATORY_CARE_PROVIDER_SITE_OTHER): Payer: Self-pay | Admitting: Ophthalmology

## 2020-11-16 ENCOUNTER — Ambulatory Visit (INDEPENDENT_AMBULATORY_CARE_PROVIDER_SITE_OTHER): Payer: Medicaid Other | Admitting: Ophthalmology

## 2020-11-16 ENCOUNTER — Other Ambulatory Visit: Payer: Self-pay

## 2020-11-16 DIAGNOSIS — H5440 Blindness, one eye, unspecified eye: Secondary | ICD-10-CM | POA: Diagnosis not present

## 2020-11-16 DIAGNOSIS — H3581 Retinal edema: Secondary | ICD-10-CM | POA: Diagnosis not present

## 2020-11-16 DIAGNOSIS — Z87828 Personal history of other (healed) physical injury and trauma: Secondary | ICD-10-CM | POA: Diagnosis not present

## 2020-11-16 DIAGNOSIS — H3322 Serous retinal detachment, left eye: Secondary | ICD-10-CM | POA: Diagnosis not present
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
## 2021-01-19 ENCOUNTER — Emergency Department (HOSPITAL_COMMUNITY): Payer: Medicaid Other

## 2021-01-19 ENCOUNTER — Encounter (HOSPITAL_COMMUNITY): Payer: Self-pay

## 2021-01-19 ENCOUNTER — Inpatient Hospital Stay (HOSPITAL_COMMUNITY)
Admission: EM | Admit: 2021-01-19 | Discharge: 2021-01-21 | DRG: 202 | Disposition: A | Discharge status: Home / Self Care | Payer: Medicaid Other | Attending: Internal Medicine | Admitting: Internal Medicine

## 2021-01-19 ENCOUNTER — Other Ambulatory Visit: Payer: Self-pay

## 2021-01-19 DIAGNOSIS — J45901 Unspecified asthma with (acute) exacerbation: Secondary | ICD-10-CM | POA: Diagnosis not present

## 2021-01-19 DIAGNOSIS — I160 Hypertensive urgency: Secondary | ICD-10-CM | POA: Diagnosis present

## 2021-01-19 DIAGNOSIS — Z20822 Contact with and (suspected) exposure to covid-19: Secondary | ICD-10-CM | POA: Diagnosis present

## 2021-01-19 DIAGNOSIS — Z87891 Personal history of nicotine dependence: Secondary | ICD-10-CM

## 2021-01-19 DIAGNOSIS — E039 Hypothyroidism, unspecified: Secondary | ICD-10-CM | POA: Diagnosis present

## 2021-01-19 DIAGNOSIS — I10 Essential (primary) hypertension: Secondary | ICD-10-CM | POA: Diagnosis present

## 2021-01-19 DIAGNOSIS — Z9114 Patient's other noncompliance with medication regimen: Secondary | ICD-10-CM

## 2021-01-19 DIAGNOSIS — J45909 Unspecified asthma, uncomplicated: Secondary | ICD-10-CM | POA: Diagnosis present

## 2021-01-19 DIAGNOSIS — J4521 Mild intermittent asthma with (acute) exacerbation: Principal | ICD-10-CM | POA: Diagnosis present

## 2021-01-19 DIAGNOSIS — R0603 Acute respiratory distress: Secondary | ICD-10-CM

## 2021-01-19 DIAGNOSIS — Z79899 Other long term (current) drug therapy: Secondary | ICD-10-CM

## 2021-01-19 DIAGNOSIS — Z825 Family history of asthma and other chronic lower respiratory diseases: Secondary | ICD-10-CM

## 2021-01-19 DIAGNOSIS — J9601 Acute respiratory failure with hypoxia: Secondary | ICD-10-CM | POA: Diagnosis present

## 2021-01-19 DIAGNOSIS — J209 Acute bronchitis, unspecified: Secondary | ICD-10-CM | POA: Diagnosis present

## 2021-01-19 HISTORY — DX: Unspecified asthma with (acute) exacerbation: J45.901

## 2021-01-19 LAB — CBC
HCT: 38.3 % (ref 36.0–46.0)
Hemoglobin: 11.6 g/dL — ABNORMAL LOW (ref 12.0–15.0)
MCH: 22.5 pg — ABNORMAL LOW (ref 26.0–34.0)
MCHC: 30.3 g/dL (ref 30.0–36.0)
MCV: 74.4 fL — ABNORMAL LOW (ref 80.0–100.0)
Platelets: 336 10*3/uL (ref 150–400)
RBC: 5.15 MIL/uL — ABNORMAL HIGH (ref 3.87–5.11)
RDW: 17.1 % — ABNORMAL HIGH (ref 11.5–15.5)
WBC: 10.3 10*3/uL (ref 4.0–10.5)
nRBC: 0 % (ref 0.0–0.2)

## 2021-01-19 LAB — COMPREHENSIVE METABOLIC PANEL
ALT: 10 U/L (ref 0–44)
AST: 14 U/L — ABNORMAL LOW (ref 15–41)
Albumin: 4.1 g/dL (ref 3.5–5.0)
Alkaline Phosphatase: 53 U/L (ref 38–126)
Anion gap: 11 (ref 5–15)
BUN: 10 mg/dL (ref 6–20)
CO2: 21 mmol/L — ABNORMAL LOW (ref 22–32)
Calcium: 8.9 mg/dL (ref 8.9–10.3)
Chloride: 105 mmol/L (ref 98–111)
Creatinine, Ser: 0.77 mg/dL (ref 0.44–1.00)
GFR, Estimated: >60 mL/min (ref >60)
Glucose, Bld: 116 mg/dL — ABNORMAL HIGH (ref 70–99)
Potassium: 3.2 mmol/L — ABNORMAL LOW (ref 3.5–5.1)
Sodium: 137 mmol/L (ref 135–145)
Total Bilirubin: 0.7 mg/dL (ref 0.3–1.2)
Total Protein: 8 g/dL (ref 6.5–8.1)

## 2021-01-19 LAB — BLOOD GAS, VENOUS
Acid-base deficit: 1.2 mmol/L (ref 0.0–2.0)
Bicarbonate: 24.6 mmol/L (ref 20.0–28.0)
FIO2: 21
O2 Saturation: 69.4 %
Patient temperature: 98.6
pCO2, Ven: 48.5 mmHg (ref 44.0–60.0)
pH, Ven: 7.326 (ref 7.250–7.430)
pO2, Ven: 41.7 mmHg (ref 32.0–45.0)

## 2021-01-19 LAB — I-STAT BETA HCG BLOOD, ED (MC, WL, AP ONLY): I-stat hCG, quantitative: <5 m[IU]/mL (ref <5)

## 2021-01-19 LAB — RESP PANEL BY RT-PCR (FLU A&B, COVID) ARPGX2
Influenza A by PCR: NEGATIVE
Influenza B by PCR: NEGATIVE
SARS Coronavirus 2 by RT PCR: NEGATIVE

## 2021-01-19 LAB — D-DIMER, QUANTITATIVE: D-Dimer, Quant: >20 ug/mL-FEU — ABNORMAL HIGH (ref 0.00–0.50)

## 2021-01-19 LAB — BRAIN NATRIURETIC PEPTIDE: B Natriuretic Peptide: 20.8 pg/mL (ref 0.0–100.0)

## 2021-01-19 MED ORDER — IPRATROPIUM-ALBUTEROL 0.5-2.5 (3) MG/3ML IN SOLN
3.0000 mL | Freq: Four times a day (QID) | RESPIRATORY_TRACT | Status: DC
  Administered 2021-01-19 – 2021-01-21 (×6): 3 mL via RESPIRATORY_TRACT
  Filled 2021-01-19 (×7): qty 3

## 2021-01-19 MED ORDER — BENZONATATE 100 MG PO CAPS
200.0000 mg | ORAL_CAPSULE | Freq: Three times a day (TID) | ORAL | Status: DC
  Administered 2021-01-19 – 2021-01-20 (×4): 200 mg via ORAL
  Filled 2021-01-19 (×5): qty 2

## 2021-01-19 MED ORDER — HEPARIN BOLUS VIA INFUSION
5000.0000 [IU] | Freq: Once | INTRAVENOUS | Status: DC
  Filled 2021-01-19: qty 5000

## 2021-01-19 MED ORDER — ENOXAPARIN SODIUM 40 MG/0.4ML IJ SOSY
40.0000 mg | PREFILLED_SYRINGE | INTRAMUSCULAR | Status: DC
  Administered 2021-01-19 – 2021-01-20 (×2): 40 mg via SUBCUTANEOUS
  Filled 2021-01-19 (×2): qty 0.4

## 2021-01-19 MED ORDER — SODIUM CHLORIDE 0.9 % IV SOLN
1.0000 g | Freq: Once | INTRAVENOUS | Status: AC
  Administered 2021-01-19: 1 g via INTRAVENOUS
  Filled 2021-01-19: qty 10

## 2021-01-19 MED ORDER — HEPARIN (PORCINE) 25000 UT/250ML-% IV SOLN: 1350.0000 [IU]/h | INTRAVENOUS | Status: DC

## 2021-01-19 MED ORDER — FLUTICASONE PROPIONATE 50 MCG/ACT NA SUSP
1.0000 | Freq: Every day | NASAL | Status: DC | PRN
  Administered 2021-01-20: 1 via NASAL
  Filled 2021-01-19 (×2): qty 16

## 2021-01-19 MED ORDER — FUROSEMIDE 10 MG/ML IJ SOLN
40.0000 mg | Freq: Once | INTRAMUSCULAR | Status: AC
  Administered 2021-01-19: 40 mg via INTRAVENOUS
  Filled 2021-01-19: qty 4

## 2021-01-19 MED ORDER — AMLODIPINE BESYLATE 5 MG PO TABS
5.0000 mg | ORAL_TABLET | Freq: Every day | ORAL | Status: DC
  Administered 2021-01-19 – 2021-01-20 (×2): 5 mg via ORAL
  Filled 2021-01-19 (×2): qty 1

## 2021-01-19 MED ORDER — IBUPROFEN 200 MG PO TABS
600.0000 mg | ORAL_TABLET | Freq: Four times a day (QID) | ORAL | Status: DC | PRN
  Administered 2021-01-19 – 2021-01-20 (×2): 600 mg via ORAL
  Filled 2021-01-19 (×2): qty 3

## 2021-01-19 MED ORDER — SODIUM CHLORIDE 0.9 % IV SOLN
500.0000 mg | Freq: Once | INTRAVENOUS | Status: AC
  Administered 2021-01-19: 500 mg via INTRAVENOUS
  Filled 2021-01-19: qty 500

## 2021-01-19 MED ORDER — AMOXICILLIN-POT CLAVULANATE 875-125 MG PO TABS
1.0000 | ORAL_TABLET | Freq: Two times a day (BID) | ORAL | Status: DC
  Administered 2021-01-19: 1 via ORAL
  Filled 2021-01-19 (×2): qty 1

## 2021-01-19 MED ORDER — TRAZODONE HCL 100 MG PO TABS
100.0000 mg | ORAL_TABLET | Freq: Every day | ORAL | Status: DC
  Administered 2021-01-19 – 2021-01-20 (×2): 100 mg via ORAL
  Filled 2021-01-19 (×2): qty 1

## 2021-01-19 MED ORDER — GUAIFENESIN ER 600 MG PO TB12
1200.0000 mg | ORAL_TABLET | Freq: Two times a day (BID) | ORAL | Status: DC
  Administered 2021-01-19 – 2021-01-21 (×4): 1200 mg via ORAL
  Filled 2021-01-19 (×4): qty 2

## 2021-01-19 MED ORDER — IPRATROPIUM-ALBUTEROL 0.5-2.5 (3) MG/3ML IN SOLN: 3.0000 mL | Freq: Four times a day (QID) | RESPIRATORY_TRACT | Status: DC

## 2021-01-19 MED ORDER — ALBUTEROL SULFATE HFA 108 (90 BASE) MCG/ACT IN AERS
1.0000 | INHALATION_SPRAY | Freq: Four times a day (QID) | RESPIRATORY_TRACT | Status: DC | PRN
  Filled 2021-01-19: qty 6.7

## 2021-01-19 MED ORDER — LABETALOL HCL 5 MG/ML IV SOLN
10.0000 mg | INTRAVENOUS | Status: DC | PRN
  Administered 2021-01-20 (×3): 10 mg via INTRAVENOUS
  Filled 2021-01-19 (×7): qty 4

## 2021-01-19 MED ORDER — BUDESONIDE 0.25 MG/2ML IN SUSP
0.2500 mg | Freq: Two times a day (BID) | RESPIRATORY_TRACT | Status: DC
  Administered 2021-01-19 – 2021-01-21 (×4): 0.25 mg via RESPIRATORY_TRACT
  Filled 2021-01-19 (×4): qty 2

## 2021-01-19 MED ORDER — LORATADINE 10 MG PO TABS: 10.0000 mg | ORAL_TABLET | Freq: Every day | ORAL | Status: DC | PRN

## 2021-01-19 MED ORDER — POTASSIUM CHLORIDE CRYS ER 20 MEQ PO TBCR
40.0000 meq | EXTENDED_RELEASE_TABLET | Freq: Once | ORAL | Status: AC
  Administered 2021-01-19: 40 meq via ORAL
  Filled 2021-01-19: qty 2

## 2021-01-19 MED ORDER — METHYLPREDNISOLONE SODIUM SUCC 125 MG IJ SOLR
125.0000 mg | Freq: Once | INTRAMUSCULAR | Status: AC
  Administered 2021-01-19: 125 mg via INTRAVENOUS
  Filled 2021-01-19: qty 2

## 2021-01-19 MED ORDER — PREDNISONE 20 MG PO TABS
40.0000 mg | ORAL_TABLET | Freq: Every day | ORAL | Status: DC
  Administered 2021-01-20 – 2021-01-21 (×2): 40 mg via ORAL
  Filled 2021-01-19 (×2): qty 2

## 2021-01-19 MED ORDER — IOHEXOL 350 MG/ML SOLN
100.0000 mL | Freq: Once | INTRAVENOUS | Status: AC | PRN
  Administered 2021-01-19: 100 mL via INTRAVENOUS

## 2021-01-19 NOTE — ED Provider Notes (Signed)
[facility] DEPT Provider Note   CSN: [id number] Arrival date & time: 01/19/21  1054     History No chief complaint on file.   Susan Walton is a 40 y.o. female.  HPI   40 year old female history of hypertension, anemia presents today with dyspnea.  She reports increasing dyspnea over the past several weeks but much more severe for the past 40 minutes.  She reports that she got a dog a couple months ago.  She has been having some increased allergic symptoms since that time.  She has been taking Benadryl and loratadine.  She took trazodone last night for the first time.  Today she noted worsening shortness of breath and states that she got into the shower which usually helps with the symptoms help.  She is continued to cough and has had some mucus with blood-tinged.  Is not had any definite fever.  She has had no known exposures to COVID.  She received 1 Moderna COVID-vaccine.  She has not received her second or booster..  She has had some associated nausea and vomiting.  She received prehospital Zofran.  Denies prior history of DVT or PE. Past Medical History:  Diagnosis Date  . Anemia   . Fibroid   . Hypertension   . Retinal detachment   . Thyroid disease    Hypothyroidism    Patient Active Problem List   Diagnosis Date Noted  . Group B Streptococcus carrier, +RV culture, currently pregnant 12/10/2017  . Gonorrhea affecting pregnancy in first trimester 12/10/2017  . 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 Pregnancy, supervision, high-risk 07/27/2017  . Chronic hypertension with superimposed severe preeclampsia 07/27/2017  . 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 Thyroid dysfunction, antepartum 07/27/2017  . History of trichomoniasis 07/27/2017  . Unwanted fertility 07/27/2017  . Advanced maternal age in multigravida, second trimester   . Adjustment disorder with depressed mood 06/05/2017    Past  Surgical History:  Procedure Laterality Date  . DILATION AND CURETTAGE OF UTERUS    . EYE SURGERY    . TUBAL LIGATION N/A 12/11/2017   Procedure: POST PARTUM TUBAL LIGATION;  Surgeon: [name], MD;  Location: [facility];  Service: Gynecology;  Laterality: N/A;     OB History    Gravida  8   Para  5   Term  5   Preterm  0   AB  3   Living  5     SAB  2   IAB  1   Ectopic  0   Multiple  0   Live Births  5           Family History  Problem Relation Age of Onset  . COPD Maternal Grandfather   . Liver disease Paternal Grandmother   . Liver disease Paternal Grandfather     Social History   Tobacco Use  . Smoking status: Former Smoker    Packs/day: 0.50    Types: Cigarettes  . Smokeless tobacco: Never Used  Vaping Use  . Vaping Use: Never used  Substance Use Topics  . Alcohol use: Not Currently  . Drug use: Not Currently    Types: Marijuana, Cocaine    Home Medications Prior to Admission medications   Medication Sig Start Date End Date Taking? Authorizing Provider  albuterol (VENTOLIN HFA) 108 (90 Base) MCG/ACT inhaler Inhale 1-2 puffs into the lungs every 6 (six) hours as needed for wheezing or shortness of breath. 11/13/20  Yes Lamptey, Myrene Galas, MD  fluticasone (FLONASE) 50 MCG/ACT nasal spray Place 1 spray into both nostrils daily as needed for allergies. 09/12/20  Yes [provider]  ibuprofen (ADVIL) 200 MG tablet Take 600 mg by mouth every 6 (six) hours as needed for moderate pain.   Yes [provider]  loratadine (CLARITIN) 10 MG tablet Take 10 mg by mouth daily as needed for allergies.   Yes [provider]  traZODone (DESYREL) 100 MG tablet Take 100 mg by mouth at bedtime. 11/07/20  Yes [provider]  cetirizine-pseudoephedrine (ZYRTEC-D) 5-120 MG tablet Take 1 tablet by mouth daily. Patient not taking: Reported on 01/19/2021 11/05/19   Loura Halt A, NP  guaiFENesin (MUCINEX) 600 MG 12 hr tablet  Take 1 tablet (600 mg total) by mouth 2 (two) times daily. Patient not taking: Reported on 01/19/2021 11/05/19   Loura Halt A, NP  ibuprofen (ADVIL,MOTRIN) 800 MG tablet Take 1 tablet (800 mg total) by mouth 3 (three) times daily. Patient not taking: Reported on 01/19/2021 09/11/18   Tereasa Coop, PA-C  olopatadine (PATANOL) 0.1 % ophthalmic solution Place 1 drop into both eyes 2 (two) times daily. Patient not taking: No sig reported 11/13/20   Chase Picket, MD  labetalol (NORMODYNE) 100 MG tablet Take 1 tablet (100 mg total) by mouth 2 (two) times daily. 05/24/19 11/13/20  Jaynee Eagles, PA-C  NIFEdipine (PROCARDIA-XL/ADALAT CC) 60 MG 24 hr tablet Take 1 tablet (60 mg total) by mouth 2 (two) times daily. Patient not taking: Reported on 03/28/2019 12/13/17 05/24/19  Osborne Oman, MD  omeprazole (PRILOSEC) 40 MG capsule Take 1 capsule (40 mg total) by mouth daily. 30 minutes before breakfast Patient not taking: Reported on 03/28/2019 05/12/18 05/24/19  Mauri Pole, MD    Allergies    Patient has no known allergies.  Review of Systems   Review of Systems  All other systems reviewed and are negative.   Physical Exam Updated Vital Signs BP (!) 174/112 (BP Location: Right Arm)   Pulse (!) 106   Temp 99.1 F (37.3 C) (Rectal)   Resp 16   Ht 1.702 m (5\' 7" )   Wt 81.6 kg   LMP 01/08/2021   SpO2 100%   BMI 28.19 kg/m   Physical Exam Vitals and nursing note reviewed.  Constitutional:      General: She is in acute distress.     Appearance: Normal appearance. She is ill-appearing.  HENT:     Head: Normocephalic.     Right Ear: External ear normal.     Left Ear: External ear normal.     Nose: Nose normal.     Mouth/Throat:     Mouth: Mucous membranes are moist.  Eyes:     Pupils: Pupils are equal, round, and reactive to light.  Cardiovascular:     Rate and Rhythm: Regular rhythm. Tachycardia present.     Pulses: Normal pulses.  Pulmonary:     Effort: Respiratory distress  present.     Breath sounds: Wheezing and rhonchi present.  Abdominal:     Palpations: Abdomen is soft.  Musculoskeletal:        General: No swelling or tenderness. Normal range of motion.     Cervical back: Normal range of motion.     Right lower leg: No edema.     Left lower leg: No edema.  Skin:    General: Skin is warm and dry.     Capillary Refill: Capillary  refill takes less than 2 seconds.  Neurological:     General: No focal deficit present.     Mental Status: She is alert.  Psychiatric:        Mood and Affect: Mood normal.     ED Results / Procedures / Treatments   Labs (all labs ordered are listed, but only abnormal results are displayed) Labs Reviewed  CBC - Abnormal; Notable for the following components:      Result Value   RBC 5.15 (*)    Hemoglobin 11.6 (*)    MCV 74.4 (*)    MCH 22.5 (*)    RDW 17.1 (*)    All other components within normal limits  COMPREHENSIVE METABOLIC PANEL - Abnormal; Notable for the following components:   Potassium 3.2 (*)    CO2 21 (*)    Glucose, Bld 116 (*)    AST 14 (*)    All other components within normal limits  D-DIMER, QUANTITATIVE - Abnormal; Notable for the following components:   D-Dimer, Quant >20.00 (*)    All other components within normal limits  RESP PANEL BY RT-PCR (FLU A&B, COVID) ARPGX2  BRAIN NATRIURETIC PEPTIDE  BLOOD GAS, VENOUS  I-STAT BETA HCG BLOOD, ED ([facility], [facility], [facility] ONLY)    EKG None  Radiology CT Angio Chest PE W and/or Wo Contrast  Result Date: 01/19/2021 CLINICAL DATA:  Short of breath. Cough for 2 months. Elevated D-dimer. Current chest radiograph shows a suprahilar density on the right. EXAM: CT ANGIOGRAPHY CHEST WITH CONTRAST TECHNIQUE: Multidetector CT imaging of the chest was performed using the standard protocol during bolus administration of intravenous contrast. Multiplanar CT image reconstructions and MIPs were obtained to evaluate the vascular anatomy. CONTRAST:  153mL OMNIPAQUE IOHEXOL 350  MG/ML SOLN COMPARISON:  Current chest radiograph. FINDINGS: Cardiovascular: Pulmonary arteries well opacified. No evidence of a pulmonary embolism. Heart normal in size and configuration. No pericardial effusion. Great vessels are normal in caliber. No aortic dissection or atherosclerosis. Mediastinum/Nodes: Normal thyroid. No neck base, mediastinal or hilar masses or enlarged lymph nodes. Trachea is unremarkable. Esophagus is prominent, but not well-defined on this exam. Lungs/Pleura: No lung mass or suspicious nodule. Bronchial wall thickening bilaterally with several areas of partial mucous plugging, the latter finding greatest in the right upper lobe adjacent to the hilum. Mild interstitial prominence, most evident in the upper lobes. There small patchy areas of peribronchovascular ground-glass opacity in the lower lobes. No pleural effusion.  No pneumothorax. Upper Abdomen: Stomach is moderately distended with fluid. Wall is thickened. Musculoskeletal: No chest wall abnormality. No acute or significant osseous findings. Review of the MIP images confirms the above findings. IMPRESSION: 1. No lung mass or suspicious nodule. 2. Thickened esophagus, not well-defined on this CT. Moderately distended stomach with significant wall thickening. Findings suggest esophagitis and diffuse gastritis, but are nonspecific. 3. Lung abnormalities noted including diffuse bronchial wall thickening, areas of partial mucous plugging, mild interstitial thickening and small patchy peribronchovascular ground-glass opacities in the lower lobes. Suspect multifocal lower lobe pneumonia and diffuse bilateral bronchitis. Electronically Signed   By: Lajean Manes M.D.   On: 01/19/2021 14:21   DG Chest Port 1 View  Result Date: 01/19/2021 CLINICAL DATA:  Increasing shortness of breath, nausea and vomiting for 8 hours. History of hypertension. EXAM: PORTABLE CHEST 1 VIEW COMPARISON:  Radiographs 03/28/2019, 03/06/2019 and 01/22/2018.  FINDINGS: 1253 hours. The heart size and mediastinal contours are normal. There is added density in the right suprahilar region compared with  prior studies. This area was partly obscured on the most recent study of 03/28/2019 by an overlying EKG lead. A small nodule or focal infiltrate in this area cannot be excluded. The lungs otherwise appear clear. There is no pleural effusion or pneumothorax. The bones appear unremarkable. Telemetry leads overlie the chest. IMPRESSION: New right suprahilar density may reflect a pulmonary nodule or focal infiltrate. If clinically suspected to reflect infection, followup PA and lateral chest X-Nariah Morgano is recommended in 3-4 weeks following trial of antibiotic therapy to ensure resolution and exclude underlying malignancy. If no clinical suspicion of infection at this time, CT could be performed earlier. Electronically Signed   By: Richardean Sale M.D.   On: 01/19/2021 13:20    Procedures .Critical Care Performed by: Pattricia Boss, MD Authorized by: Pattricia Boss, MD   Critical care provider statement:    Critical care time (minutes):  45   Critical care was necessary to treat or prevent imminent or life-threatening deterioration of the following conditions:  Respiratory failure   Critical care was time spent personally by me on the following activities:  Discussions with consultants, evaluation of patient's response to treatment, examination of patient, ordering and performing treatments and interventions, ordering and review of laboratory studies, ordering and review of radiographic studies, pulse oximetry, re-evaluation of patient's condition, obtaining history from patient or surrogate and review of old charts     Medications Ordered in ED Medications  heparin ADULT infusion 100 units/mL (25000 units/26mL) (has no administration in time range)  heparin bolus via infusion 5,000 Units (has no administration in time range)  furosemide (LASIX) injection 40 mg (40 mg  Intravenous Given 01/19/21 1159)  iohexol (OMNIPAQUE) 350 MG/ML injection 100 mL (100 mLs Intravenous Contrast Given 01/19/21 1355)    ED Course  I have reviewed the triage vital signs and the nursing notes.  Pertinent labs & imaging results that were available during my care of the patient were reviewed by me and considered in my medical decision making (see chart for details). Patient improved here after lasix, albuterol.   MDM Rules/Calculators/A&P                          40 year old female history of hypertension, noncompliant with medication, presents today in respiratory distress.  She had some rhonchi and wheezing on initial exam evaluation.  Patient hypertensive on my initial evaluation with some rhonchi.  Given her hypertension, patient was given Lasix.  She reports some urine output.  She is also received bronchodilators. During evaluation D-dimer returned at greater than 20.  Heparin was ordered and CT angiogram obtained.  CT angio is negative for PE.  She does have a thickened esophagus moderately distended stomach.  Lung abnormalities are noted and include diffuse bronchial wall thickening and areas of partial mucous plugging with mild interstitial thickening and small patchy peribronchovascular groundglass opacities in the lower lobes suspicious for multifocal lower lobe pneumonia and diffuse bilateral bronchitis.  Patient has negative COVID test. DDX Acute respiratory distress Acute allergic reaction Vs reactive airway Vs PE Vs acute infectious process-discussed with  Vs chf/volume overload Suspect multifactorial with chronic and acute components.  Improved here after  Elevated d-dimer- cta negative, unclear etiology- no swelling in legs, may need doppler  Discussed with Dr. Roosevelt Locks who will see for admission  Final Clinical Impression(s) / ED Diagnoses Final diagnoses:  Respiratory distress    Rx / DC Orders ED Discharge Orders    None  Pattricia Boss, MD 01/19/21  [phone]

## 2021-01-19 NOTE — ED Triage Notes (Signed)
Patient coming from home with c/o difficulties with breathing and nausea/vomiting. Patient was given Zofran 4 mg per ems for nausea.

## 2021-01-19 NOTE — H&P (Signed)
History and Physical    Susan Walton [id number] DOB: [birth year] DOA: 01/19/2021  PCP: Department, [facility] (Confirm with patient/family/NH records and if not entered, this has to be entered at [facility] point of entry) Patient coming from: Home  I have personally briefly reviewed patient's old medical records in [place]  Chief Complaint: Wheezing cough shortness of breath  HPI: Susan Walton is a 40 y.o. female with medical history significant of mild intermittent asthma, HTN noncompliant with medications, hypothyroidism, presented with new onset of wheezing cough and shortness of breath.  Patient adopted a new puppy 3 months ago and since then she has developed increased stuffy nose and postnasal drip for which she has been using Flonase with some relief.  This week her symptoms became worse and developed wheezing and cough " feeling something stuck in the throat and cannot cough up", denies any fever chills no chest pains.  She has been using albuterol pump 2 times a day with minimal effect.  She was diagnosed with high blood pressure about 3 years ago but stopped taking medication 1 year ago.  Denied any headache no blurry vision no chest pains.  EMS arrived found patient O2 saturation in the mid 80s. ED Course: Blood pressure significantly elevated, physical exam showed wheezing and improved with breathing treatment.  D-dimer> 20, CT angiogram negative for PE but acute bronchitis.  Review of Systems: As per HPI otherwise 14 point review of systems negative.    Past Medical History:  Diagnosis Date  . Anemia   . Asthma attack 01/19/2021  . Fibroid   . Hypertension   . Retinal detachment   . Thyroid disease    Hypothyroidism    Past Surgical History:  Procedure Laterality Date  . DILATION AND CURETTAGE OF UTERUS    . EYE SURGERY    . TUBAL LIGATION N/A 12/11/2017   Procedure: POST PARTUM TUBAL LIGATION;  Surgeon: [name], MD;  Location: [place];  Service: Gynecology;  Laterality: N/A;     reports that she has quit smoking. Her smoking use included cigarettes. She smoked 0.50 packs per day. She has never used smokeless tobacco. She reports previous alcohol use. She reports previous drug use. Drugs: Marijuana and Cocaine.  No Known Allergies  Family History  Problem Relation Age of Onset  . COPD Maternal Grandfather   . Liver disease Paternal Grandmother   . Liver disease Paternal Grandfather      Prior to Admission medications   Medication Sig Start Date End Date Taking? Authorizing Provider  albuterol (VENTOLIN HFA) 108 (90 Base) MCG/ACT inhaler Inhale 1-2 puffs into the lungs every 6 (six) hours as needed for wheezing or shortness of breath. 11/13/20  Yes Lamptey, Myrene Galas, MD  fluticasone (FLONASE) 50 MCG/ACT nasal spray Place 1 spray into both nostrils daily as needed for allergies. 09/12/20  Yes [provider]  ibuprofen (ADVIL) 200 MG tablet Take 600 mg by mouth every 6 (six) hours as needed for moderate pain.   Yes [provider]  loratadine (CLARITIN) 10 MG tablet Take 10 mg by mouth daily as needed for allergies.   Yes [provider]  traZODone (DESYREL) 100 MG tablet Take 100 mg by mouth at bedtime. 11/07/20  Yes [provider]  cetirizine-pseudoephedrine (ZYRTEC-D) 5-120 MG tablet Take 1 tablet by mouth daily. Patient not taking: Reported on 01/19/2021 11/05/19   Loura Halt A, NP  guaiFENesin (MUCINEX) 600 MG 12 hr tablet Take 1 tablet (600  mg total) by mouth 2 (two) times daily. Patient not taking: Reported on 01/19/2021 11/05/19   Loura Halt A, NP  ibuprofen (ADVIL,MOTRIN) 800 MG tablet Take 1 tablet (800 mg total) by mouth 3 (three) times daily. Patient not taking: Reported on 01/19/2021 09/11/18   Tereasa Coop, PA-C  olopatadine (PATANOL) 0.1 % ophthalmic solution Place 1 drop into both eyes 2 (two) times daily. Patient not taking: No sig reported 11/13/20   Chase Picket, MD  labetalol (NORMODYNE) 100 MG tablet Take 1 tablet (100 mg total) by mouth 2 (two) times daily. 05/24/19 11/13/20  Jaynee Eagles, PA-C  NIFEdipine (PROCARDIA-XL/ADALAT CC) 60 MG 24 hr tablet Take 1 tablet (60 mg total) by mouth 2 (two) times daily. Patient not taking: Reported on 03/28/2019 12/13/17 05/24/19  Osborne Oman, MD  omeprazole (PRILOSEC) 40 MG capsule Take 1 capsule (40 mg total) by mouth daily. 30 minutes before breakfast Patient not taking: Reported on 03/28/2019 05/12/18 05/24/19  Mauri Pole, MD    Physical Exam: Vitals:   01/19/21 1330 01/19/21 1410 01/19/21 1445 01/19/21 1549  BP: (!) 184/117 (!) 174/112 (!) 154/121 (!) 167/118  Pulse: 98 (!) 106 (!) 103 91  Resp: (!) 21 16 20 14   Temp:      TempSrc:      SpO2: 100% 100% 100% 100%  Weight:  81.6 kg    Height:  5\' 7"  (1.702 m)      Constitutional: NAD, calm, comfortable Vitals:   01/19/21 1330 01/19/21 1410 01/19/21 1445 01/19/21 1549  BP: (!) 184/117 (!) 174/112 (!) 154/121 (!) 167/118  Pulse: 98 (!) 106 (!) 103 91  Resp: (!) 21 16 20 14   Temp:      TempSrc:      SpO2: 100% 100% 100% 100%  Weight:  81.6 kg    Height:  5\' 7"  (1.702 m)     Eyes: PERRL, lids and conjunctivae normal ENMT: Mucous membranes are moist. Posterior pharynx clear of any exudate or lesions.Normal dentition.  Neck: normal, supple, no masses, no thyromegaly Respiratory: clear to auscultation bilaterally, scattered wheezing, no crackles.  Increasing respiratory effort. No accessory muscle use.  Cardiovascular: Regular rate and rhythm, no murmurs / rubs / gallops. No extremity edema. 2+ pedal pulses. No carotid bruits.  Abdomen: no tenderness, no masses palpated. No hepatosplenomegaly. Bowel sounds positive.  Musculoskeletal: no clubbing / cyanosis. No joint deformity upper and lower extremities. Good ROM, no contractures. Normal muscle tone.  Skin: no rashes, lesions, ulcers. No induration Neurologic: CN 2-12 grossly intact. Sensation  intact, DTR normal. Strength 5/5 in all 4.  Psychiatric: Normal judgment and insight. Alert and oriented x 3. Normal mood.     Labs on Admission: I have personally reviewed following labs and imaging studies  CBC: Recent Labs  Lab 01/19/21 1138  WBC 10.3  HGB 11.6*  HCT 38.3  MCV 74.4*  PLT 616   Basic Metabolic Panel: Recent Labs  Lab 01/19/21 1138  NA 137  K 3.2*  CL 105  CO2 21*  GLUCOSE 116*  BUN 10  CREATININE 0.77  CALCIUM 8.9   GFR: Estimated Creatinine Clearance: 102.7 mL/min (by C-G formula based on SCr of 0.77 mg/dL). Liver Function Tests: Recent Labs  Lab 01/19/21 1138  AST 14*  ALT 10  ALKPHOS 53  BILITOT 0.7  PROT 8.0  ALBUMIN 4.1   No results for input(s): LIPASE, AMYLASE in the last 168 hours. No results for input(s): AMMONIA in the last  168 hours. Coagulation Profile: No results for input(s): INR, PROTIME in the last 168 hours. Cardiac Enzymes: No results for input(s): CKTOTAL, CKMB, CKMBINDEX, TROPONINI in the last 168 hours. BNP (last 3 results) No results for input(s): PROBNP in the last 8760 hours. HbA1C: No results for input(s): HGBA1C in the last 72 hours. CBG: No results for input(s): GLUCAP in the last 168 hours. Lipid Profile: No results for input(s): CHOL, HDL, LDLCALC, TRIG, CHOLHDL, LDLDIRECT in the last 72 hours. Thyroid Function Tests: No results for input(s): TSH, T4TOTAL, FREET4, T3FREE, THYROIDAB in the last 72 hours. Anemia Panel: No results for input(s): VITAMINB12, FOLATE, FERRITIN, TIBC, IRON, RETICCTPCT in the last 72 hours. Urine analysis:    Component Value Date/Time   COLORURINE YELLOW 12/09/2017 [place] 12/09/2017 1035   LABSPEC >=1.030 08/27/2020 0925   PHURINE 6.0 08/27/2020 0925   GLUCOSEU NEGATIVE 08/27/2020 0925   HGBUR TRACE (A) 08/27/2020 0925   BILIRUBINUR NEGATIVE 08/27/2020 0925   KETONESUR 15 (A) 08/27/2020 0925   PROTEINUR 30 (A) 08/27/2020 0925   UROBILINOGEN 0.2  08/27/2020 0925   NITRITE NEGATIVE 08/27/2020 0925   LEUKOCYTESUR SMALL (A) 08/27/2020 0925    Radiological Exams on Admission: CT Angio Chest PE W and/or Wo Contrast  Result Date: 01/19/2021 CLINICAL DATA:  Short of breath. Cough for 2 months. Elevated D-dimer. Current chest radiograph shows a suprahilar density on the right. EXAM: CT ANGIOGRAPHY CHEST WITH CONTRAST TECHNIQUE: Multidetector CT imaging of the chest was performed using the standard protocol during bolus administration of intravenous contrast. Multiplanar CT image reconstructions and MIPs were obtained to evaluate the vascular anatomy. CONTRAST:  176mL OMNIPAQUE IOHEXOL 350 MG/ML SOLN COMPARISON:  Current chest radiograph. FINDINGS: Cardiovascular: Pulmonary arteries well opacified. No evidence of a pulmonary embolism. Heart normal in size and configuration. No pericardial effusion. Great vessels are normal in caliber. No aortic dissection or atherosclerosis. Mediastinum/Nodes: Normal thyroid. No neck base, mediastinal or hilar masses or enlarged lymph nodes. Trachea is unremarkable. Esophagus is prominent, but not well-defined on this exam. Lungs/Pleura: No lung mass or suspicious nodule. Bronchial wall thickening bilaterally with several areas of partial mucous plugging, the latter finding greatest in the right upper lobe adjacent to the hilum. Mild interstitial prominence, most evident in the upper lobes. There small patchy areas of peribronchovascular ground-glass opacity in the lower lobes. No pleural effusion.  No pneumothorax. Upper Abdomen: Stomach is moderately distended with fluid. Wall is thickened. Musculoskeletal: No chest wall abnormality. No acute or significant osseous findings. Review of the MIP images confirms the above findings. IMPRESSION: 1. No lung mass or suspicious nodule. 2. Thickened esophagus, not well-defined on this CT. Moderately distended stomach with significant wall thickening. Findings suggest esophagitis and  diffuse gastritis, but are nonspecific. 3. Lung abnormalities noted including diffuse bronchial wall thickening, areas of partial mucous plugging, mild interstitial thickening and small patchy peribronchovascular ground-glass opacities in the lower lobes. Suspect multifocal lower lobe pneumonia and diffuse bilateral bronchitis. Electronically Signed   By: Lajean Manes M.D.   On: 01/19/2021 14:21   DG Chest Port 1 View  Result Date: 01/19/2021 CLINICAL DATA:  Increasing shortness of breath, nausea and vomiting for 8 hours. History of hypertension. EXAM: PORTABLE CHEST 1 VIEW COMPARISON:  Radiographs 03/28/2019, 03/06/2019 and 01/22/2018. FINDINGS: 1253 hours. The heart size and mediastinal contours are normal. There is added density in the right suprahilar region compared with prior studies. This area was partly obscured on the most recent study of 03/28/2019 by  an overlying EKG lead. A small nodule or focal infiltrate in this area cannot be excluded. The lungs otherwise appear clear. There is no pleural effusion or pneumothorax. The bones appear unremarkable. Telemetry leads overlie the chest. IMPRESSION: New right suprahilar density may reflect a pulmonary nodule or focal infiltrate. If clinically suspected to reflect infection, followup PA and lateral chest X-ray is recommended in 3-4 weeks following trial of antibiotic therapy to ensure resolution and exclude underlying malignancy. If no clinical suspicion of infection at this time, CT could be performed earlier. Electronically Signed   By: Richardean Sale M.D.   On: 01/19/2021 13:20    EKG: Ordered  Assessment/Plan Active Problems:   Asthma attack   Asthma  (please populate well all problems here in Problem List. (For example, if patient is on BP meds at home and you resume or decide to hold them, it is a problem that needs to be her. Same for CAD, COPD, HLD and so on)  Acute hypoxic respite failure -Secondary to acute asthma  exacerbation -DuoNeb nebulizer every 6 hours, as needed albuterol, start Pulmicort -Wean off oxygen.  Acute bronchitis/asthma exacerbation -Short course of Augmentin. -Breathing treatment as above.  Uncontrolled hypertension -Start amlodipine   DVT prophylaxis: SCD code Status: Full code Family Communication: None at bedside Disposition Plan: Expect less than 2 midnight hospital stay Consults called: None Admission status: MedSurg observation   Lequita Halt MD [facility] Pager [phone]  01/19/2021, 4:13 PM

## 2021-01-19 NOTE — ED Notes (Signed)
Pt gone to CT 

## 2021-01-19 NOTE — ED Notes (Signed)
PT complains of headache

## 2021-01-19 NOTE — ED Notes (Signed)
ED TO INPATIENT HANDOFF REPORT  ED Nurse Name and Phone #: [phone]  S Name/Age/Gender Susan Walton 40 y.o. female Room/Bed: WA14/WA14  Code Status   Code Status: Full Code  Home/SNF/Other Home Patient oriented to: self, place, time and situation Is this baseline? Yes   Triage Complete: Triage complete  Chief Complaint Asthma [J45.909]  Triage Note Patient coming from home with c/o difficulties with breathing and nausea/vomiting. Patient was given Zofran 4 mg per ems for nausea.    Allergies No Known Allergies  Level of Care/Admitting Diagnosis ED Disposition    ED Disposition Condition Comment   Admit  Hospital Area: [facility] [[id number]]  Level of Care: Med-Surg [16]  Covid Evaluation: Asymptomatic Screening Protocol (No Symptoms)  Diagnosis: Asthma [[id number]]  Admitting Physician: Emeline General [[id number]]  Attending Physician: Emeline General [[id number]]       B Medical/Surgery History Past Medical History:  Diagnosis Date  . Anemia   . Asthma attack 01/19/2021  . Fibroid   . Hypertension   . Retinal detachment   . Thyroid disease    Hypothyroidism   Past Surgical History:  Procedure Laterality Date  . DILATION AND CURETTAGE OF UTERUS    . EYE SURGERY    . TUBAL LIGATION N/A 12/11/2017   Procedure: POST PARTUM TUBAL LIGATION;  Surgeon: Catalina Antigua, MD;  Location: [facility];  Service: Gynecology;  Laterality: N/A;     A IV Location/Drains/Wounds Patient Lines/Drains/Airways Status    Active Line/Drains/Airways    Name Placement date Placement time Site Days   Peripheral IV 01/19/21 Anterior;Left;Proximal Forearm 01/19/21  --  Forearm  less than 1          Intake/Output Last 24 hours No intake or output data in the 24 hours ending 01/19/21 1655  Labs/Imaging Results for orders placed or performed during the hospital encounter of 01/19/21 (from the past 48 hour(s))  CBC     Status: Abnormal   Collection Time:  01/19/21 11:38 AM  Result Value Ref Range   WBC 10.3 4.0 - 10.5 K/uL   RBC 5.15 (H) 3.87 - 5.11 MIL/uL   Hemoglobin 11.6 (L) 12.0 - 15.0 g/dL   HCT 36.6 44.0 - 34.7 %   MCV 74.4 (L) 80.0 - 100.0 fL   MCH 22.5 (L) 26.0 - 34.0 pg   MCHC 30.3 30.0 - 36.0 g/dL   RDW 42.5 (H) 95.6 - 38.7 %   Platelets 336 150 - 400 K/uL   nRBC 0.0 0.0 - 0.2 %    Comment: Performed at [facility], [address] [place], [place] [address]  Comprehensive metabolic panel     Status: Abnormal   Collection Time: 01/19/21 11:38 AM  Result Value Ref Range   Sodium 137 135 - 145 mmol/L   Potassium 3.2 (L) 3.5 - 5.1 mmol/L   Chloride 105 98 - 111 mmol/L   CO2 21 (L) 22 - 32 mmol/L   Glucose, Bld 116 (H) 70 - 99 mg/dL    Comment: Glucose reference range applies only to samples taken after fasting for at least 8 hours.   BUN 10 6 - 20 mg/dL   Creatinine, Ser 2.95 0.44 - 1.00 mg/dL   Calcium 8.9 8.9 - 18.8 mg/dL   Total Protein 8.0 6.5 - 8.1 g/dL   Albumin 4.1 3.5 - 5.0 g/dL   AST 14 (L) 15 - 41 U/L   ALT 10 0 - 44 U/L   Alkaline Phosphatase  53 38 - 126 U/L   Total Bilirubin 0.7 0.3 - 1.2 mg/dL   GFR, Estimated >60 >60 mL/min    Comment: (NOTE) Calculated using the CKD-EPI Creatinine Equation (2021)    Anion gap 11 5 - 15    Comment: Performed at [facility], [place] [address], [place], [place] [address]  D-dimer, quantitative     Status: Abnormal   Collection Time: 01/19/21 11:38 AM  Result Value Ref Range   D-Dimer, Quant >20.00 (H) 0.00 - 0.50 ug/mL-FEU    Comment: (NOTE) At the manufacturer cut-off value of 0.5 g/mL FEU, this assay has a negative predictive value of 95-100%.This assay is intended for use in conjunction with a clinical pretest probability (PTP) assessment model to exclude pulmonary embolism (PE) and deep venous thrombosis (DVT) in outpatients suspected of PE or DVT. Results should be correlated with clinical presentation. Performed at [facility], [place] [address], [place], [place] [address]   Brain natriuretic peptide     Status: None   Collection Time: 01/19/21 11:45 AM  Result Value Ref Range   B Natriuretic Peptide 20.8 0.0 - 100.0 pg/mL    Comment: Performed at [facility], [place] [address], [place], [place] [address]  Resp Panel by RT-PCR (Flu A&B, Covid) Nasopharyngeal Swab     Status: None   Collection Time: 01/19/21 11:45 AM   Specimen: Nasopharyngeal Swab; Nasopharyngeal(NP) swabs in vial transport medium  Result Value Ref Range   SARS Coronavirus 2 by RT PCR NEGATIVE NEGATIVE    Comment: (NOTE) SARS-CoV-2 target nucleic acids are NOT DETECTED.  The SARS-CoV-2 RNA is generally detectable in upper respiratory specimens during the acute phase of infection. The lowest concentration of SARS-CoV-2 viral copies this assay can detect is 138 copies/mL. A negative result does not preclude SARS-Cov-2 infection and should not be used as the sole basis for treatment or other patient management decisions. A negative result may occur with  improper specimen collection/handling, submission of specimen other than nasopharyngeal swab, presence of viral mutation(s) within the areas targeted by this assay, and inadequate number of viral copies(<138 copies/mL). A negative result must be combined with clinical observations, patient history, and epidemiological information. The expected result is Negative.  Fact Sheet for Patients:  EntrepreneurPulse.com.au  Fact Sheet for Healthcare Providers:  IncredibleEmployment.be  This test is no t yet approved or cleared by the [place] FDA and  has been authorized for detection and/or diagnosis of SARS-CoV-2 by FDA under an Emergency Use Authorization (EUA). This EUA will remain  in effect (meaning this test can be used) for the duration of the COVID-19 declaration under Section 564(b)(1) of the Act,  21 U.S.C.section 360bbb-3(b)(1), unless the authorization is terminated  or revoked sooner.       Influenza A by PCR NEGATIVE NEGATIVE   Influenza B by PCR NEGATIVE NEGATIVE    Comment: (NOTE) The Xpert Xpress SARS-CoV-2/FLU/RSV plus assay is intended as an aid in the diagnosis of influenza from Nasopharyngeal swab specimens and should not be used as a sole basis for treatment. Nasal washings and aspirates are unacceptable for Xpert Xpress SARS-CoV-2/FLU/RSV testing.  Fact Sheet for Patients: EntrepreneurPulse.com.au  Fact Sheet for Healthcare Providers: IncredibleEmployment.be  This test is not yet approved or cleared by the [place] FDA and has been authorized for detection and/or diagnosis of SARS-CoV-2 by FDA under an Emergency Use Authorization (EUA). This EUA will remain in effect (meaning this test can be used) for the  duration of the COVID-19 declaration under Section 564(b)(1) of the Act, 21 U.S.C. section 360bbb-3(b)(1), unless the authorization is terminated or revoked.  Performed at [facility], [address] [place], [place] [address]   I-Stat Beta hCG blood, ED ([facility], [facility], [facility] only)     Status: None   Collection Time: 01/19/21 11:49 AM  Result Value Ref Range   I-stat hCG, quantitative <5.0 <5 mIU/mL   Comment 3            Comment:   GEST. AGE      CONC.  (mIU/mL)   <=1 WEEK        5 - 50     2 WEEKS       50 - 500     3 WEEKS       100 - 10,000     4 WEEKS     1,000 - 30,000        FEMALE AND NON-PREGNANT FEMALE:     LESS THAN 5 mIU/mL   Blood gas, venous     Status: None   Collection Time: 01/19/21 12:37 PM  Result Value Ref Range   FIO2 21.00    pH, Ven 7.326 7.250 - 7.430   pCO2, Ven 48.5 44.0 - 60.0 mmHg   pO2, Ven 41.7 32.0 - 45.0 mmHg   Bicarbonate 24.6 20.0 - 28.0 mmol/L   Acid-base deficit 1.2 0.0 - 2.0 mmol/L   O2 Saturation 69.4 %   Patient temperature 98.6     Comment: Performed  at [facility], [address], [place], [place] [address]   CT Angio Chest PE W and/or Wo Contrast  Result Date: 01/19/2021 CLINICAL DATA:  Short of breath. Cough for 2 months. Elevated D-dimer. Current chest radiograph shows a suprahilar density on the right. EXAM: CT ANGIOGRAPHY CHEST WITH CONTRAST TECHNIQUE: Multidetector CT imaging of the chest was performed using the standard protocol during bolus administration of intravenous contrast. Multiplanar CT image reconstructions and MIPs were obtained to evaluate the vascular anatomy. CONTRAST:  OMNIPAQUE IOHEXOL 350 MG/ML SOLN COMPARISON:  Current chest radiograph. FINDINGS: Cardiovascular: Pulmonary arteries well opacified. No evidence of a pulmonary embolism. Heart normal in size and configuration. No pericardial effusion. Great vessels are normal in caliber. No aortic dissection or atherosclerosis. Mediastinum/Nodes: Normal thyroid. No neck base, mediastinal or hilar masses or enlarged lymph nodes. Trachea is unremarkable. Esophagus is prominent, but not well-defined on this exam. Lungs/Pleura: No lung mass or suspicious nodule. Bronchial wall thickening bilaterally with several areas of partial mucous plugging, the latter finding greatest in the right upper lobe adjacent to the hilum. Mild interstitial prominence, most evident in the upper lobes. There small patchy areas of peribronchovascular ground-glass opacity in the lower lobes. No pleural effusion.  No pneumothorax. Upper Abdomen: Stomach is moderately distended with fluid. Wall is thickened. Musculoskeletal: No chest wall abnormality. No acute or significant osseous findings. Review of the MIP images confirms the above findings. IMPRESSION: 1. No lung mass or suspicious nodule. 2. Thickened esophagus, not well-defined on this CT. Moderately distended stomach with significant wall thickening. Findings suggest esophagitis and diffuse gastritis, but are nonspecific. 3. Lung  abnormalities noted including diffuse bronchial wall thickening, areas of partial mucous plugging, mild interstitial thickening and small patchy peribronchovascular ground-glass opacities in the lower lobes. Suspect multifocal lower lobe pneumonia and diffuse bilateral bronchitis. Electronically Signed   By: Amie Portland M.D.   On: 01/19/2021 14:21   DG Chest [facility]  1 View  Result Date: 01/19/2021 CLINICAL DATA:  Increasing shortness of breath, nausea and vomiting for 8 hours. History of hypertension. EXAM: PORTABLE CHEST 1 VIEW COMPARISON:  Radiographs 03/28/2019, 03/06/2019 and 01/22/2018. FINDINGS: 1253 hours. The heart size and mediastinal contours are normal. There is added density in the right suprahilar region compared with prior studies. This area was partly obscured on the most recent study of 03/28/2019 by an overlying EKG lead. A small nodule or focal infiltrate in this area cannot be excluded. The lungs otherwise appear clear. There is no pleural effusion or pneumothorax. The bones appear unremarkable. Telemetry leads overlie the chest. IMPRESSION: New right suprahilar density may reflect a pulmonary nodule or focal infiltrate. If clinically suspected to reflect infection, followup PA and lateral chest X-ray is recommended in 3-4 weeks following trial of antibiotic therapy to ensure resolution and exclude underlying malignancy. If no clinical suspicion of infection at this time, CT could be performed earlier. Electronically Signed   By: Richardean Sale M.D.   On: 01/19/2021 13:20    Pending Labs Unresulted Labs (From admission, onward)          Start     Ordered   01/19/21 1552  HIV Antibody (routine testing w rflx)  (HIV Antibody (Routine testing w reflex) panel)  Once,   STAT        01/19/21 1556          Vitals/Pain Today's Vitals   01/19/21 1445 01/19/21 1549 01/19/21 1615 01/19/21 1645  BP: (!) 154/121 (!) 167/118 137/90 (!) 150/94  Pulse: (!) 103 91 86 92  Resp: 20 14 20  (!)  33  Temp:      TempSrc:      SpO2: 100% 100% 100% 100%  Weight:      Height:      PainSc:        Isolation Precautions Airborne and Contact precautions  Medications Medications  azithromycin (ZITHROMAX) 500 mg in sodium chloride 0.9 % 250 mL IVPB (500 mg Intravenous New Bag/Given 01/19/21 1617)  guaiFENesin (MUCINEX) 12 hr tablet 1,200 mg (has no administration in time range)  benzonatate (TESSALON) capsule 200 mg (has no administration in time range)  amLODipine (NORVASC) tablet 5 mg (has no administration in time range)  ibuprofen (ADVIL) tablet 600 mg (has no administration in time range)  traZODone (DESYREL) tablet 100 mg (has no administration in time range)  albuterol (VENTOLIN HFA) 108 (90 Base) MCG/ACT inhaler 1-2 puff (has no administration in time range)  fluticasone (FLONASE) 50 MCG/ACT nasal spray 1 spray (has no administration in time range)  loratadine (CLARITIN) tablet 10 mg (has no administration in time range)  budesonide (PULMICORT) nebulizer solution 0.25 mg (has no administration in time range)  ipratropium-albuterol (DUONEB) 0.5-2.5 (3) MG/3ML nebulizer solution 3 mL (has no administration in time range)  potassium chloride SA (KLOR-CON) CR tablet 40 mEq (has no administration in time range)  amoxicillin-clavulanate (AUGMENTIN) 875-125 MG per tablet 1 tablet (has no administration in time range)  predniSONE (DELTASONE) tablet 40 mg (has no administration in time range)  furosemide (LASIX) injection 40 mg (40 mg Intravenous Given 01/19/21 1159)  iohexol (OMNIPAQUE) 350 MG/ML injection 100 mL (100 mLs Intravenous Contrast Given 01/19/21 1355)  cefTRIAXone (ROCEPHIN) 1 g in sodium chloride 0.9 % 100 mL IVPB (1 g Intravenous New Bag/Given 01/19/21 1512)  methylPREDNISolone sodium succinate (SOLU-MEDROL) 125 mg/2 mL injection 125 mg (125 mg Intravenous Given 01/19/21 1510)    Mobility walks Low fall risk  Focused Assessments .   R Recommendations: See Admitting  Provider Note  Report given to:   Additional Notes: n/a

## 2021-01-20 ENCOUNTER — Inpatient Hospital Stay (HOSPITAL_COMMUNITY): Payer: Medicaid Other

## 2021-01-20 DIAGNOSIS — J9601 Acute respiratory failure with hypoxia: Secondary | ICD-10-CM

## 2021-01-20 DIAGNOSIS — E039 Hypothyroidism, unspecified: Secondary | ICD-10-CM | POA: Diagnosis present

## 2021-01-20 DIAGNOSIS — M7989 Other specified soft tissue disorders: Secondary | ICD-10-CM

## 2021-01-20 DIAGNOSIS — Z79899 Other long term (current) drug therapy: Secondary | ICD-10-CM | POA: Diagnosis not present

## 2021-01-20 DIAGNOSIS — Z20822 Contact with and (suspected) exposure to covid-19: Secondary | ICD-10-CM | POA: Diagnosis present

## 2021-01-20 DIAGNOSIS — J4521 Mild intermittent asthma with (acute) exacerbation: Secondary | ICD-10-CM | POA: Diagnosis present

## 2021-01-20 DIAGNOSIS — Z87891 Personal history of nicotine dependence: Secondary | ICD-10-CM | POA: Diagnosis not present

## 2021-01-20 DIAGNOSIS — Z825 Family history of asthma and other chronic lower respiratory diseases: Secondary | ICD-10-CM | POA: Diagnosis not present

## 2021-01-20 DIAGNOSIS — J45901 Unspecified asthma with (acute) exacerbation: Secondary | ICD-10-CM | POA: Diagnosis not present

## 2021-01-20 DIAGNOSIS — R0603 Acute respiratory distress: Secondary | ICD-10-CM | POA: Diagnosis present

## 2021-01-20 DIAGNOSIS — I10 Essential (primary) hypertension: Secondary | ICD-10-CM | POA: Diagnosis present

## 2021-01-20 DIAGNOSIS — I160 Hypertensive urgency: Secondary | ICD-10-CM | POA: Diagnosis present

## 2021-01-20 DIAGNOSIS — J209 Acute bronchitis, unspecified: Secondary | ICD-10-CM | POA: Diagnosis present

## 2021-01-20 DIAGNOSIS — Z9114 Patient's other noncompliance with medication regimen: Secondary | ICD-10-CM | POA: Diagnosis not present

## 2021-01-20 LAB — HIV ANTIBODY (ROUTINE TESTING W REFLEX): HIV Screen 4th Generation wRfx: NONREACTIVE

## 2021-01-20 MED ORDER — AMLODIPINE BESYLATE 5 MG PO TABS: 5.0000 mg | ORAL_TABLET | Freq: Every day | ORAL | Status: AC

## 2021-01-20 MED ORDER — AMLODIPINE BESYLATE 10 MG PO TABS
10.0000 mg | ORAL_TABLET | Freq: Every day | ORAL | Status: DC
  Administered 2021-01-21: 10 mg via ORAL
  Filled 2021-01-20: qty 1

## 2021-01-20 MED ORDER — ONDANSETRON HCL 4 MG/2ML IJ SOLN
4.0000 mg | Freq: Four times a day (QID) | INTRAMUSCULAR | Status: DC | PRN
  Administered 2021-01-20: 4 mg via INTRAVENOUS
  Filled 2021-01-20: qty 2

## 2021-01-20 MED ORDER — ALUM & MAG HYDROXIDE-SIMETH 200-200-20 MG/5ML PO SUSP
30.0000 mL | ORAL | Status: DC | PRN
  Administered 2021-01-20 – 2021-01-21 (×2): 30 mL via ORAL
  Filled 2021-01-20 (×2): qty 30

## 2021-01-20 MED ORDER — HYDRALAZINE HCL 20 MG/ML IJ SOLN
10.0000 mg | Freq: Once | INTRAMUSCULAR | Status: AC
  Administered 2021-01-20: 10 mg via INTRAVENOUS
  Filled 2021-01-20 (×2): qty 0.5

## 2021-01-20 MED ORDER — ONDANSETRON HCL 4 MG PO TABS: 4.0000 mg | ORAL_TABLET | Freq: Four times a day (QID) | ORAL | Status: DC | PRN

## 2021-01-20 NOTE — Progress Notes (Signed)
PROGRESS NOTE    Susan Walton  SWF:[id number] DOB: [birth year] DOA: 01/19/2021 PCP: Department, [facility]   Brief Narrative:  Susan Walton is a 40 y.o. female with medical history significant of mild intermittent asthma, HTN noncompliant with medications, hypothyroidism, presented with new onset of wheezing cough and shortness of breath.  Patient reports being diagnosed with hypertension 3 years ago but continues to refuse to take any medications, admitted for hypoxia, D-dimer greater than 20 with negative PE and profound hypertension.   Assessment & Plan:   Active Problems:   Asthma attack   Asthma   Acute respiratory failure with hypoxia (HCC)   Acute hypoxic respiratory failure in setting of acute asthma exacerbation due to medication noncompliance - Continue nebs, supportive care, steroids, wean oxygen as tolerated - Lengthy discussion about need for follow-up and medication compliance including inhalers at bedside today - Discontinued antibiotics due to patient's abdominal discomfort and nausea without vomiting; reviewed CT chest without any overt findings of embolism or pneumonia, given intolerance of antibiotics will hold off and continue supportive care only. -D-dimer greater than 20, lower extremity Dopplers pending  Hypertensive urgency - In the setting of profound medication noncompliance  - Start amlodipine -increase to 10 mg daily -Blood pressure markedly uncontrolled today given symptoms despite IV labetalol and hydralazine  Medication noncompliance - Profound medication noncompliance in the setting of above, she continues to follow with a pediatric primary care physician despite being 40 per our discussion today -   DVT prophylaxis: Lovenox Code Status: Full Family Communication: None present  Status is: Inpatient  Dispo: The patient is from: Home              Anticipated d/c is to: Home              Anticipated d/c date is: 24 to 48 hours               Patient currently not medically stable for discharge  Consultants:   None  Procedures:   None  Antimicrobials:  Discontinued  Subjective: No acute issues or events overnight  Objective: Vitals:   01/20/21 0929 01/20/21 0943 01/20/21 0945 01/20/21 1137  BP: (!) 203/137 (!) 226/122 (!) 212/127 (!) 173/101  Pulse: 98 93 91 94  Resp: 20 18  19   Temp:  97.7 F (36.5 C)  (!) 97.4 F (36.3 C)  TempSrc:  Oral  Oral  SpO2: 100% 100% 96% 98%  Weight:      Height:        Intake/Output Summary (Last 24 hours) at 01/20/2021 1303 Last data filed at 01/20/2021 1000 Gross per 24 hour  Intake 923.9 ml  Output 800 ml  Net 123.9 ml   Filed Weights   01/19/21 1410  Weight: 81.6 kg    Examination:  General:  Pleasantly resting in bed, No acute distress. HEENT:  Normocephalic atraumatic.  Sclerae nonicteric, noninjected.  Extraocular movements intact bilaterally. Neck:  Without mass or deformity.  Trachea is midline. Lungs: Inspiratory and expiratory wheezing without rales. Heart:  Regular rate and rhythm.  Without murmurs, rubs, or gallops. Abdomen:  Soft, nontender, nondistended.  Without guarding or rebound. Extremities: Without cyanosis, clubbing, edema, or obvious deformity. Vascular:  Dorsalis pedis and posterior tibial pulses palpable bilaterally. Skin:  Warm and dry, no erythema, no ulcerations.  Data Reviewed: I have personally reviewed following labs and imaging studies  CBC: Recent Labs  Lab 01/19/21 1138  WBC 10.3  HGB 11.6*  HCT 38.3  MCV 74.4*  PLT 950   Basic Metabolic Panel: Recent Labs  Lab 01/19/21 1138  NA 137  K 3.2*  CL 105  CO2 21*  GLUCOSE 116*  BUN 10  CREATININE 0.77  CALCIUM 8.9   GFR: Estimated Creatinine Clearance: 102.7 mL/min (by C-G formula based on SCr of 0.77 mg/dL). Liver Function Tests: Recent Labs  Lab 01/19/21 1138  AST 14*  ALT 10  ALKPHOS 53  BILITOT 0.7  PROT 8.0  ALBUMIN 4.1   No results for input(s):  LIPASE, AMYLASE in the last 168 hours. No results for input(s): AMMONIA in the last 168 hours. Coagulation Profile: No results for input(s): INR, PROTIME in the last 168 hours. Cardiac Enzymes: No results for input(s): CKTOTAL, CKMB, CKMBINDEX, TROPONINI in the last 168 hours. BNP (last 3 results) No results for input(s): PROBNP in the last 8760 hours. HbA1C: No results for input(s): HGBA1C in the last 72 hours. CBG: No results for input(s): GLUCAP in the last 168 hours. Lipid Profile: No results for input(s): CHOL, HDL, LDLCALC, TRIG, CHOLHDL, LDLDIRECT in the last 72 hours. Thyroid Function Tests: No results for input(s): TSH, T4TOTAL, FREET4, T3FREE, THYROIDAB in the last 72 hours. Anemia Panel: No results for input(s): VITAMINB12, FOLATE, FERRITIN, TIBC, IRON, RETICCTPCT in the last 72 hours. Sepsis Labs: No results for input(s): PROCALCITON, LATICACIDVEN in the last 168 hours.  Recent Results (from the past 240 hour(s))  Resp Panel by RT-PCR (Flu A&B, Covid) Nasopharyngeal Swab     Status: None   Collection Time: 01/19/21 11:45 AM   Specimen: Nasopharyngeal Swab; Nasopharyngeal(NP) swabs in vial transport medium  Result Value Ref Range Status   SARS Coronavirus 2 by RT PCR NEGATIVE NEGATIVE Final    Comment: (NOTE) SARS-CoV-2 target nucleic acids are NOT DETECTED.  The SARS-CoV-2 RNA is generally detectable in upper respiratory specimens during the acute phase of infection. The lowest concentration of SARS-CoV-2 viral copies this assay can detect is 138 copies/mL. A negative result does not preclude SARS-Cov-2 infection and should not be used as the sole basis for treatment or other patient management decisions. A negative result may occur with  improper specimen collection/handling, submission of specimen other than nasopharyngeal swab, presence of viral mutation(s) within the areas targeted by this assay, and inadequate number of viral copies(<138 copies/mL). A negative  result must be combined with clinical observations, patient history, and epidemiological information. The expected result is Negative.  Fact Sheet for Patients:  EntrepreneurPulse.com.au  Fact Sheet for Healthcare Providers:  IncredibleEmployment.be  This test is no t yet approved or cleared by the [place] FDA and  has been authorized for detection and/or diagnosis of SARS-CoV-2 by FDA under an Emergency Use Authorization (EUA). This EUA will remain  in effect (meaning this test can be used) for the duration of the COVID-19 declaration under Section 564(b)(1) of the Act, 21 U.S.C.section 360bbb-3(b)(1), unless the authorization is terminated  or revoked sooner.       Influenza A by PCR NEGATIVE NEGATIVE Final   Influenza B by PCR NEGATIVE NEGATIVE Final    Comment: (NOTE) The Xpert Xpress SARS-CoV-2/FLU/RSV plus assay is intended as an aid in the diagnosis of influenza from Nasopharyngeal swab specimens and should not be used as a sole basis for treatment. Nasal washings and aspirates are unacceptable for Xpert Xpress SARS-CoV-2/FLU/RSV testing.  Fact Sheet for Patients: EntrepreneurPulse.com.au  Fact Sheet for Healthcare Providers: IncredibleEmployment.be  This test is not yet approved or cleared by the [place] FDA  and has been authorized for detection and/or diagnosis of SARS-CoV-2 by FDA under an Emergency Use Authorization (EUA). This EUA will remain in effect (meaning this test can be used) for the duration of the COVID-19 declaration under Section 564(b)(1) of the Act, 21 U.S.C. section 360bbb-3(b)(1), unless the authorization is terminated or revoked.  Performed at [facility], [place] [address] [place], [place] [address]          Radiology Studies: CT Angio Chest PE W and/or Wo Contrast  Result Date: 01/19/2021 CLINICAL DATA:  Short of breath. Cough for 2  months. Elevated D-dimer. Current chest radiograph shows a suprahilar density on the right. EXAM: CT ANGIOGRAPHY CHEST WITH CONTRAST TECHNIQUE: Multidetector CT imaging of the chest was performed using the standard protocol during bolus administration of intravenous contrast. Multiplanar CT image reconstructions and MIPs were obtained to evaluate the vascular anatomy. CONTRAST:  161mL OMNIPAQUE IOHEXOL 350 MG/ML SOLN COMPARISON:  Current chest radiograph. FINDINGS: Cardiovascular: Pulmonary arteries well opacified. No evidence of a pulmonary embolism. Heart normal in size and configuration. No pericardial effusion. Great vessels are normal in caliber. No aortic dissection or atherosclerosis. Mediastinum/Nodes: Normal thyroid. No neck base, mediastinal or hilar masses or enlarged lymph nodes. Trachea is unremarkable. Esophagus is prominent, but not well-defined on this exam. Lungs/Pleura: No lung mass or suspicious nodule. Bronchial wall thickening bilaterally with several areas of partial mucous plugging, the latter finding greatest in the right upper lobe adjacent to the hilum. Mild interstitial prominence, most evident in the upper lobes. There small patchy areas of peribronchovascular ground-glass opacity in the lower lobes. No pleural effusion.  No pneumothorax. Upper Abdomen: Stomach is moderately distended with fluid. Wall is thickened. Musculoskeletal: No chest wall abnormality. No acute or significant osseous findings. Review of the MIP images confirms the above findings. IMPRESSION: 1. No lung mass or suspicious nodule. 2. Thickened esophagus, not well-defined on this CT. Moderately distended stomach with significant wall thickening. Findings suggest esophagitis and diffuse gastritis, but are nonspecific. 3. Lung abnormalities noted including diffuse bronchial wall thickening, areas of partial mucous plugging, mild interstitial thickening and small patchy peribronchovascular ground-glass opacities in the  lower lobes. Suspect multifocal lower lobe pneumonia and diffuse bilateral bronchitis. Electronically Signed   By: Lajean Manes M.D.   On: 01/19/2021 14:21   DG Chest Port 1 View  Result Date: 01/19/2021 CLINICAL DATA:  Increasing shortness of breath, nausea and vomiting for 8 hours. History of hypertension. EXAM: PORTABLE CHEST 1 VIEW COMPARISON:  Radiographs 03/28/2019, 03/06/2019 and 01/22/2018. FINDINGS: 1253 hours. The heart size and mediastinal contours are normal. There is added density in the right suprahilar region compared with prior studies. This area was partly obscured on the most recent study of 03/28/2019 by an overlying EKG lead. A small nodule or focal infiltrate in this area cannot be excluded. The lungs otherwise appear clear. There is no pleural effusion or pneumothorax. The bones appear unremarkable. Telemetry leads overlie the chest. IMPRESSION: New right suprahilar density may reflect a pulmonary nodule or focal infiltrate. If clinically suspected to reflect infection, followup PA and lateral chest X-ray is recommended in 3-4 weeks following trial of antibiotic therapy to ensure resolution and exclude underlying malignancy. If no clinical suspicion of infection at this time, CT could be performed earlier. Electronically Signed   By: Richardean Sale M.D.   On: 01/19/2021 13:20        Scheduled Meds: . [START ON 01/21/2021] amLODipine  10 mg Oral Daily  . amLODipine  5 mg Oral Daily  . benzonatate  200 mg Oral TID  . budesonide (PULMICORT) nebulizer solution  0.25 mg Nebulization BID  . enoxaparin (LOVENOX) injection  40 mg Subcutaneous Q24H  . guaiFENesin  1,200 mg Oral BID  . ipratropium-albuterol  3 mL Nebulization Q6H  . predniSONE  40 mg Oral Q breakfast  . traZODone  100 mg Oral QHS    LOS: 0 days   Time spent: 66min  Charlize Hathaway C Joye Wesenberg, DO [facility]  If 7PM-7AM, please contact night-coverage [email or web address]  01/20/2021, 1:03 PM

## 2021-01-21 LAB — CBC
HCT: 32.7 % — ABNORMAL LOW (ref 36.0–46.0)
Hemoglobin: 9.6 g/dL — ABNORMAL LOW (ref 12.0–15.0)
MCH: 22 pg — ABNORMAL LOW (ref 26.0–34.0)
MCHC: 29.4 g/dL — ABNORMAL LOW (ref 30.0–36.0)
MCV: 74.8 fL — ABNORMAL LOW (ref 80.0–100.0)
Platelets: 421 10*3/uL — ABNORMAL HIGH (ref 150–400)
RBC: 4.37 MIL/uL (ref 3.87–5.11)
RDW: 17 % — ABNORMAL HIGH (ref 11.5–15.5)
WBC: 11.7 10*3/uL — ABNORMAL HIGH (ref 4.0–10.5)
nRBC: 0 % (ref 0.0–0.2)

## 2021-01-21 LAB — COMPREHENSIVE METABOLIC PANEL
ALT: 8 U/L (ref 0–44)
AST: 9 U/L — ABNORMAL LOW (ref 15–41)
Albumin: 3.7 g/dL (ref 3.5–5.0)
Alkaline Phosphatase: 46 U/L (ref 38–126)
Anion gap: 6 (ref 5–15)
BUN: 16 mg/dL (ref 6–20)
CO2: 29 mmol/L (ref 22–32)
Calcium: 9.2 mg/dL (ref 8.9–10.3)
Chloride: 103 mmol/L (ref 98–111)
Creatinine, Ser: 0.73 mg/dL (ref 0.44–1.00)
GFR, Estimated: >60 mL/min (ref >60)
Glucose, Bld: 98 mg/dL (ref 70–99)
Potassium: 3.9 mmol/L (ref 3.5–5.1)
Sodium: 138 mmol/L (ref 135–145)
Total Bilirubin: 0.2 mg/dL — ABNORMAL LOW (ref 0.3–1.2)
Total Protein: 7.7 g/dL (ref 6.5–8.1)

## 2021-01-21 MED ORDER — ONDANSETRON HCL 4 MG PO TABS: 4.0000 mg | ORAL_TABLET | Freq: Four times a day (QID) | ORAL | 0 refills | Status: DC | PRN

## 2021-01-21 MED ORDER — PREDNISONE 10 MG PO TABS: ORAL_TABLET | ORAL | 0 refills | Status: AC

## 2021-01-21 MED ORDER — CARVEDILOL 6.25 MG PO TABS: 6.2500 mg | ORAL_TABLET | Freq: Two times a day (BID) | ORAL | 0 refills | Status: DC

## 2021-01-21 MED ORDER — AMLODIPINE BESYLATE 10 MG PO TABS: 10.0000 mg | ORAL_TABLET | Freq: Every day | ORAL | 0 refills | Status: DC

## 2021-01-21 MED ORDER — CARVEDILOL 6.25 MG PO TABS
6.2500 mg | ORAL_TABLET | Freq: Two times a day (BID) | ORAL | Status: DC
  Administered 2021-01-21: 6.25 mg via ORAL
  Filled 2021-01-21: qty 1

## 2021-01-21 NOTE — Discharge Summary (Signed)
Physician Discharge Summary  Susan Walton [id number] DOB: 1981/03/25 DOA: 01/19/2021  PCP: Department, [place] date: 01/19/2021 Discharge date: 01/21/2021  Admitted From: Home Disposition:  Home  Recommendations for Outpatient Follow-up:  1. Follow up with PCP in 1-2 weeks 2. Please obtain BMP/CBC in one week 3. Please follow up with pulmonology as directed  Discharge Condition: Stable  CODE STATUS: Full  Diet recommendation: As tolerated    Brief/Interim Summary: Susan Sallahis a 40 y.o.femalewith medical history significant ofmild intermittent asthma, HTN noncompliant with medications, hypothyroidism, presented with new onset of wheezing cough and shortness of breath.  Patient reports being diagnosed with hypertension 3 years ago but continues to refuse to take any medications, admitted for hypoxia, D-dimer greater than 20 with negative PE and profound hypertension.  Patient admitted as above with acute asthma exacerbation and questionable pneumonia but without hypoxia. On further evaluation she did not have any symptoms of pneumonia and imaging was unremarkable for any overt infiltrate and procal was unremarkable. Antibiotics stopped and respiratory symptoms improved on supportive care alone - patient requesting discharge home. Blood pressure poorly controlled at intake due to medication noncompliance - restarted amlodipine at higher dose and started carvedilol with moderate improvement - will likely need further adjustment in the outpatient setting. Patient states PCP is Susan Walton - we discussed moving to a PCP that covers adult medicine would be more ideal.  Discharge Diagnoses:  Active Problems:   Asthma attack   Asthma   Acute respiratory failure with hypoxia [facility])    Discharge Instructions  Discharge Instructions    Call MD for:  difficulty breathing, headache or visual disturbances   Complete by: As directed    Diet - low sodium heart  healthy   Complete by: As directed    Increase activity slowly   Complete by: As directed      Allergies as of 01/21/2021   No Known Allergies     Medication List    STOP taking these medications   ibuprofen 200 MG tablet Commonly known as: ADVIL     TAKE these medications   albuterol 108 (90 Base) MCG/ACT inhaler Commonly known as: VENTOLIN HFA Inhale 1-2 puffs into the lungs every 6 (six) hours as needed for wheezing or shortness of breath.   amLODipine 10 MG tablet Commonly known as: NORVASC Take 1 tablet (10 mg total) by mouth daily. Start taking on: Jan 22, 2021   carvedilol 6.25 MG tablet Commonly known as: COREG Take 1 tablet (6.25 mg total) by mouth 2 (two) times daily with a meal.   fluticasone 50 MCG/ACT nasal spray Commonly known as: FLONASE Place 1 spray into both nostrils daily as needed for allergies.   loratadine 10 MG tablet Commonly known as: CLARITIN Take 10 mg by mouth daily as needed for allergies.   ondansetron 4 MG tablet Commonly known as: ZOFRAN Take 1 tablet (4 mg total) by mouth every 6 (six) hours as needed for nausea.   predniSONE 10 MG tablet Commonly known as: DELTASONE Take 4 tablets (40 mg total) by mouth daily for 3 days, THEN 3 tablets (30 mg total) daily for 3 days, THEN 2 tablets (20 mg total) daily for 3 days, THEN 1 tablet (10 mg total) daily for 3 days. Start taking on: Jan 21, 2021   traZODone 100 MG tablet Commonly known as: DESYREL Take 100 mg by mouth at bedtime.       No Known Allergies  Consultations: NONE  Procedures/Studies:  CT Angio Chest PE W and/or Wo Contrast  Result Date: 01/19/2021 CLINICAL DATA:  Short of breath. Cough for 2 months. Elevated D-dimer. Current chest radiograph shows a suprahilar density on the right. EXAM: CT ANGIOGRAPHY CHEST WITH CONTRAST TECHNIQUE: Multidetector CT imaging of the chest was performed using the standard protocol during bolus administration of intravenous contrast.  Multiplanar CT image reconstructions and MIPs were obtained to evaluate the vascular anatomy. CONTRAST:  146mL OMNIPAQUE IOHEXOL 350 MG/ML SOLN COMPARISON:  Current chest radiograph. FINDINGS: Cardiovascular: Pulmonary arteries well opacified. No evidence of a pulmonary embolism. Heart normal in size and configuration. No pericardial effusion. Great vessels are normal in caliber. No aortic dissection or atherosclerosis. Mediastinum/Nodes: Normal thyroid. No neck base, mediastinal or hilar masses or enlarged lymph nodes. Trachea is unremarkable. Esophagus is prominent, but not well-defined on this exam. Lungs/Pleura: No lung mass or suspicious nodule. Bronchial wall thickening bilaterally with several areas of partial mucous plugging, the latter finding greatest in the right upper lobe adjacent to the hilum. Mild interstitial prominence, most evident in the upper lobes. There small patchy areas of peribronchovascular ground-glass opacity in the lower lobes. No pleural effusion.  No pneumothorax. Upper Abdomen: Stomach is moderately distended with fluid. Wall is thickened. Musculoskeletal: No chest wall abnormality. No acute or significant osseous findings. Review of the MIP images confirms the above findings. IMPRESSION: 1. No lung mass or suspicious nodule. 2. Thickened esophagus, not well-defined on this CT. Moderately distended stomach with significant wall thickening. Findings suggest esophagitis and diffuse gastritis, but are nonspecific. 3. Lung abnormalities noted including diffuse bronchial wall thickening, areas of partial mucous plugging, mild interstitial thickening and small patchy peribronchovascular ground-glass opacities in the lower lobes. Suspect multifocal lower lobe pneumonia and diffuse bilateral bronchitis. Electronically Signed   By: Lajean Manes M.D.   On: 01/19/2021 14:21   DG Chest Port 1 View  Result Date: 01/19/2021 CLINICAL DATA:  Increasing shortness of breath, nausea and vomiting for  8 hours. History of hypertension. EXAM: PORTABLE CHEST 1 VIEW COMPARISON:  Radiographs 03/28/2019, 03/06/2019 and 01/22/2018. FINDINGS: 1253 hours. The heart size and mediastinal contours are normal. There is added density in the right suprahilar region compared with prior studies. This area was partly obscured on the most recent study of 03/28/2019 by an overlying EKG lead. A small nodule or focal infiltrate in this area cannot be excluded. The lungs otherwise appear clear. There is no pleural effusion or pneumothorax. The bones appear unremarkable. Telemetry leads overlie the chest. IMPRESSION: New right suprahilar density may reflect a pulmonary nodule or focal infiltrate. If clinically suspected to reflect infection, followup PA and lateral chest X-ray is recommended in 3-4 weeks following trial of antibiotic therapy to ensure resolution and exclude underlying malignancy. If no clinical suspicion of infection at this time, CT could be performed earlier. Electronically Signed   By: Richardean Sale M.D.   On: 01/19/2021 13:20   VAS [place] LOWER EXTREMITY VENOUS (DVT)  Result Date: 01/21/2021  Lower Venous DVT Study Patient Name:  YURITZY ZEHRING  Date of Exam:   01/20/2021 Medical Rec #: [id number]     Accession #:    [id number] Date of Birth: November 26, 1980     Patient Gender: F Patient Age:   13Y Exam Location:  [facility] Procedure:      VAS [place] LOWER EXTREMITY VENOUS (DVT) Referring Phys: [id number] Lequita Halt --------------------------------------------------------------------------------  Indications: Swelling.  Comparison Study: No previous exams Performing Technologist: Jody Hill RVT, RDMS  Examination  Guidelines: A complete evaluation includes B-mode imaging, spectral Doppler, color Doppler, and power Doppler as needed of all accessible portions of each vessel. Bilateral testing is considered an integral part of a complete examination. Limited examinations for reoccurring indications may be performed as  noted. The reflux portion of the exam is performed with the patient in reverse Trendelenburg.  +---------+---------------+---------+-----------+----------+--------------+ RIGHT    CompressibilityPhasicitySpontaneityPropertiesThrombus Aging +---------+---------------+---------+-----------+----------+--------------+ CFV      Full           Yes      Yes                                 +---------+---------------+---------+-----------+----------+--------------+ SFJ      Full                                                        +---------+---------------+---------+-----------+----------+--------------+ FV Prox  Full           Yes      Yes                                 +---------+---------------+---------+-----------+----------+--------------+ FV Mid   Full           Yes      Yes                                 +---------+---------------+---------+-----------+----------+--------------+ FV DistalFull           Yes      Yes                                 +---------+---------------+---------+-----------+----------+--------------+ PFV      Full                                                        +---------+---------------+---------+-----------+----------+--------------+ POP      Full           Yes      Yes                                 +---------+---------------+---------+-----------+----------+--------------+ PTV      Full                                                        +---------+---------------+---------+-----------+----------+--------------+ PERO     Full                                                        +---------+---------------+---------+-----------+----------+--------------+   +---------+---------------+---------+-----------+----------+--------------+ LEFT  CompressibilityPhasicitySpontaneityPropertiesThrombus Aging +---------+---------------+---------+-----------+----------+--------------+ CFV      Full            Yes      Yes                                 +---------+---------------+---------+-----------+----------+--------------+ SFJ      Full                                                        +---------+---------------+---------+-----------+----------+--------------+ FV Prox  Full           Yes      Yes                                 +---------+---------------+---------+-----------+----------+--------------+ FV Mid   Full           Yes      Yes                                 +---------+---------------+---------+-----------+----------+--------------+ FV DistalFull           Yes      Yes                                 +---------+---------------+---------+-----------+----------+--------------+ PFV      Full                                                        +---------+---------------+---------+-----------+----------+--------------+ POP      Full           Yes      Yes                                 +---------+---------------+---------+-----------+----------+--------------+ PTV      Full                                                        +---------+---------------+---------+-----------+----------+--------------+ PERO     Full                                                        +---------+---------------+---------+-----------+----------+--------------+     Summary: BILATERAL: - No evidence of deep vein thrombosis seen in the lower extremities, bilaterally. - No evidence of superficial venous thrombosis in the lower extremities, bilaterally. -No evidence of popliteal cyst, bilaterally.   *See table(s) above for measurements and observations. Electronically signed by Servando Snare MD on 01/21/2021 at 2:58:28 PM.    Final     Subjective: No acute issues/events overnight  Discharge Exam: Vitals:   01/21/21 1153 01/21/21 1203  BP:    Pulse: 97 96  Resp:    Temp:    SpO2: 100% 91%   Vitals:   01/21/21 0514 01/21/21 0740 01/21/21 1153  01/21/21 1203  BP: (!) 141/95     Pulse: 77  97 96  Resp: 16     Temp: 97.7 F (36.5 C)     TempSrc: Oral     SpO2: 100% 98% 100% 91%  Weight:      Height:       General: Pt is alert, awake, not in acute distress Cardiovascular: RRR, S1/S2 +, no rubs, no gallops Respiratory: CTA bilaterally, no wheezing, no rhonchi Abdominal: Soft, NT, ND, bowel sounds + Extremities: no edema, no cyanosis   The results of significant diagnostics from this hospitalization (including imaging, microbiology, ancillary and laboratory) are listed below for reference.     Microbiology: Recent Results (from the past 240 hour(s))  Resp Panel by RT-PCR (Flu A&B, Covid) Nasopharyngeal Swab     Status: None   Collection Time: 01/19/21 11:45 AM   Specimen: Nasopharyngeal Swab; Nasopharyngeal(NP) swabs in vial transport medium  Result Value Ref Range Status   SARS Coronavirus 2 by RT PCR NEGATIVE NEGATIVE Final    Comment: (NOTE) SARS-CoV-2 target nucleic acids are NOT DETECTED.  The SARS-CoV-2 RNA is generally detectable in upper respiratory specimens during the acute phase of infection. The lowest concentration of SARS-CoV-2 viral copies this assay can detect is 138 copies/mL. A negative result does not preclude SARS-Cov-2 infection and should not be used as the sole basis for treatment or other patient management decisions. A negative result may occur with  improper specimen collection/handling, submission of specimen other than nasopharyngeal swab, presence of viral mutation(s) within the areas targeted by this assay, and inadequate number of viral copies(<138 copies/mL). A negative result must be combined with clinical observations, patient history, and epidemiological information. The expected result is Negative.  Fact Sheet for Patients:  EntrepreneurPulse.com.au  Fact Sheet for Healthcare Providers:  IncredibleEmployment.be  This test is no t yet approved  or cleared by the [place] FDA and  has been authorized for detection and/or diagnosis of SARS-CoV-2 by FDA under an Emergency Use Authorization (EUA). This EUA will remain  in effect (meaning this test can be used) for the duration of the COVID-19 declaration under Section 564(b)(1) of the Act, 21 U.S.C.section 360bbb-3(b)(1), unless the authorization is terminated  or revoked sooner.       Influenza A by PCR NEGATIVE NEGATIVE Final   Influenza B by PCR NEGATIVE NEGATIVE Final    Comment: (NOTE) The Xpert Xpress SARS-CoV-2/FLU/RSV plus assay is intended as an aid in the diagnosis of influenza from Nasopharyngeal swab specimens and should not be used as a sole basis for treatment. Nasal washings and aspirates are unacceptable for Xpert Xpress SARS-CoV-2/FLU/RSV testing.  Fact Sheet for Patients: EntrepreneurPulse.com.au  Fact Sheet for Healthcare Providers: IncredibleEmployment.be  This test is not yet approved or cleared by the [place] FDA and has been authorized for detection and/or diagnosis of SARS-CoV-2 by FDA under an Emergency Use Authorization (EUA). This EUA will remain in effect (meaning this test can be used) for the duration of the COVID-19 declaration under Section 564(b)(1) of the Act, 21 U.S.C. section 360bbb-3(b)(1), unless the authorization is terminated or revoked.  Performed at [facility] ([place]), [place] [address], [place],  [address]      Labs: BNP (last 3 results)  Recent Labs    01/19/21 1145  BNP [id number]   Basic Metabolic Panel: Recent Labs  Lab 01/19/21 1138 01/21/21 0418  NA 137 138  K 3.2* 3.9  CL 105 103  CO2 21* 29  GLUCOSE 116* 98  BUN 10 16  CREATININE 0.77 0.73  CALCIUM 8.9 9.2   Liver Function Tests: Recent Labs  Lab 01/19/21 1138 01/21/21 0418  AST 14* 9*  ALT 10 8  ALKPHOS 53 46  BILITOT 0.7 0.2*  PROT 8.0 7.7  ALBUMIN 4.1 3.7   No results for input(s):  LIPASE, AMYLASE in the last 168 hours. No results for input(s): AMMONIA in the last 168 hours. CBC: Recent Labs  Lab 01/19/21 1138 01/21/21 0418  WBC 10.3 11.7*  HGB 11.6* 9.6*  HCT 38.3 32.7*  MCV 74.4* 74.8*  PLT 336 421*   Cardiac Enzymes: No results for input(s): CKTOTAL, CKMB, CKMBINDEX, TROPONINI in the last 168 hours. BNP: Invalid input(s): POCBNP CBG: No results for input(s): GLUCAP in the last 168 hours. D-Dimer Recent Labs    01/19/21 1138  DDIMER >20.00*   Hgb A1c No results for input(s): HGBA1C in the last 72 hours. Lipid Profile No results for input(s): CHOL, HDL, LDLCALC, TRIG, CHOLHDL, LDLDIRECT in the last 72 hours. Thyroid function studies No results for input(s): TSH, T4TOTAL, T3FREE, THYROIDAB in the last 72 hours.  Invalid input(s): FREET3 Anemia work up No results for input(s): VITAMINB12, FOLATE, FERRITIN, TIBC, IRON, RETICCTPCT in the last 72 hours. Urinalysis    Component Value Date/Time   COLORURINE YELLOW 12/09/2017 [place] 12/09/2017 1035   LABSPEC >=1.030 08/27/2020 0925   PHURINE 6.0 08/27/2020 0925   GLUCOSEU NEGATIVE 08/27/2020 0925   HGBUR TRACE (A) 08/27/2020 0925   BILIRUBINUR NEGATIVE 08/27/2020 0925   KETONESUR 15 (A) 08/27/2020 0925   PROTEINUR 30 (A) 08/27/2020 0925   UROBILINOGEN 0.2 08/27/2020 0925   NITRITE NEGATIVE 08/27/2020 0925   LEUKOCYTESUR SMALL (A) 08/27/2020 0925   Sepsis Labs Invalid input(s): PROCALCITONIN,  WBC,  LACTICIDVEN Microbiology Recent Results (from the past 240 hour(s))  Resp Panel by RT-PCR (Flu A&B, Covid) Nasopharyngeal Swab     Status: None   Collection Time: 01/19/21 11:45 AM   Specimen: Nasopharyngeal Swab; Nasopharyngeal(NP) swabs in vial transport medium  Result Value Ref Range Status   SARS Coronavirus 2 by RT PCR NEGATIVE NEGATIVE Final    Comment: (NOTE) SARS-CoV-2 target nucleic acids are NOT DETECTED.  The SARS-CoV-2 RNA is generally detectable in upper  respiratory specimens during the acute phase of infection. The lowest concentration of SARS-CoV-2 viral copies this assay can detect is 138 copies/mL. A negative result does not preclude SARS-Cov-2 infection and should not be used as the sole basis for treatment or other patient management decisions. A negative result may occur with  improper specimen collection/handling, submission of specimen other than nasopharyngeal swab, presence of viral mutation(s) within the areas targeted by this assay, and inadequate number of viral copies(<138 copies/mL). A negative result must be combined with clinical observations, patient history, and epidemiological information. The expected result is Negative.  Fact Sheet for Patients:  EntrepreneurPulse.com.au  Fact Sheet for Healthcare Providers:  IncredibleEmployment.be  This test is no t yet approved or cleared by the [place] FDA and  has been authorized for detection and/or diagnosis of SARS-CoV-2 by FDA under an Emergency Use Authorization (EUA). This EUA will remain  in effect (meaning this test can be used) for the duration of the COVID-19  declaration under Section 564(b)(1) of the Act, 21 U.S.C.section 360bbb-3(b)(1), unless the authorization is terminated  or revoked sooner.       Influenza A by PCR NEGATIVE NEGATIVE Final   Influenza B by PCR NEGATIVE NEGATIVE Final    Comment: (NOTE) The Xpert Xpress SARS-CoV-2/FLU/RSV plus assay is intended as an aid in the diagnosis of influenza from Nasopharyngeal swab specimens and should not be used as a sole basis for treatment. Nasal washings and aspirates are unacceptable for Xpert Xpress SARS-CoV-2/FLU/RSV testing.  Fact Sheet for Patients: EntrepreneurPulse.com.au  Fact Sheet for Healthcare Providers: IncredibleEmployment.be  This test is not yet approved or cleared by the [place] FDA and has been  authorized for detection and/or diagnosis of SARS-CoV-2 by FDA under an Emergency Use Authorization (EUA). This EUA will remain in effect (meaning this test can be used) for the duration of the COVID-19 declaration under Section 564(b)(1) of the Act, 21 U.S.C. section 360bbb-3(b)(1), unless the authorization is terminated or revoked.  Performed at [facility] [place] [address] [place], [place] [address]      Time coordinating discharge: Over 30 minutes  SIGNED:   Little Ishikawa, DO [facility] 01/21/2021, 7:36 PM Pager   If 7PM-7AM, please contact night-coverage [email or web address]

## 2021-01-21 NOTE — Progress Notes (Signed)
Reviewed written d/c instructions w pt and all questions answered. Pt verbalized understanding. D/c per w/c w all belongings in stable condition.

## 2021-01-21 NOTE — Progress Notes (Signed)
Ambulated pt for approx 5 min to monitor O2 saturation. Pt experienced no dyspnea, range of sats were 100% down to 89%. Lowest sat occurred w coughing episode but returned back to mid 90's fairly quickly. Pt tol this well

## 2021-03-08 ENCOUNTER — Telehealth: Payer: Medicaid Other | Admitting: Physician Assistant

## 2021-03-08 DIAGNOSIS — J019 Acute sinusitis, unspecified: Secondary | ICD-10-CM

## 2021-03-08 DIAGNOSIS — B9689 Other specified bacterial agents as the cause of diseases classified elsewhere: Secondary | ICD-10-CM

## 2021-03-08 MED ORDER — AMOXICILLIN-POT CLAVULANATE 875-125 MG PO TABS: 1.0000 | ORAL_TABLET | Freq: Two times a day (BID) | ORAL | 0 refills | Status: DC

## 2021-03-08 NOTE — Patient Instructions (Signed)
Susan Walton, thank you for joining [name], PA-C for today's virtual visit.  While this provider is not your primary care provider (PCP), if your PCP is located in our provider database this encounter information will be shared with them immediately following your visit.  Consent: (Patient) Susan Walton provided verbal consent for this virtual visit at the beginning of the encounter.  Current Medications:  Current Outpatient Medications:    albuterol (VENTOLIN HFA) 108 (90 Base) MCG/ACT inhaler, Inhale 1-2 puffs into the lungs every 6 (six) hours as needed for wheezing or shortness of breath., Disp: 18 g, Rfl: 0   amLODipine (NORVASC) 10 MG tablet, Take 1 tablet (10 mg total) by mouth daily., Disp: 30 tablet, Rfl: 0   carvedilol (COREG) 6.25 MG tablet, Take 1 tablet (6.25 mg total) by mouth 2 (two) times daily with a meal., Disp: 60 tablet, Rfl: 0   fluticasone (FLONASE) 50 MCG/ACT nasal spray, Place 1 spray into both nostrils daily as needed for allergies., Disp: , Rfl:    loratadine (CLARITIN) 10 MG tablet, Take 10 mg by mouth daily as needed for allergies., Disp: , Rfl:    ondansetron (ZOFRAN) 4 MG tablet, Take 1 tablet (4 mg total) by mouth every 6 (six) hours as needed for nausea., Disp: 20 tablet, Rfl: 0   traZODone (DESYREL) 100 MG tablet, Take 100 mg by mouth at bedtime., Disp: , Rfl:    Medications ordered in this encounter:  No orders of the defined types were placed in this encounter.    *If you need refills on other medications prior to your next appointment, please contact your pharmacy*  Follow-Up: Call back or seek an in-person evaluation if the symptoms worsen or if the condition fails to improve as anticipated.  Other Instructions Please take antibiotic as directed.  Increase fluid intake.  Use Saline nasal spray.  Take a daily multivitamin. Continue allergy medications.  Place a humidifier in the bedroom.   Sinusitis Sinusitis is redness, soreness, and  swelling (inflammation) of the paranasal sinuses. Paranasal sinuses are air pockets within the bones of your face (beneath the eyes, the middle of the forehead, or above the eyes). In healthy paranasal sinuses, mucus is able to drain out, and air is able to circulate through them by way of your nose. However, when your paranasal sinuses are inflamed, mucus and air can become trapped. This can allow bacteria and other germs to grow and cause infection. Sinusitis can develop quickly and last only a short time (acute) or continue over a long period (chronic). Sinusitis that lasts for more than 12 weeks is considered chronic.  CAUSES  Causes of sinusitis include: Allergies. Structural abnormalities, such as displacement of the cartilage that separates your nostrils (deviated septum), which can decrease the air flow through your nose and sinuses and affect sinus drainage. Functional abnormalities, such as when the small hairs (cilia) that line your sinuses and help remove mucus do not work properly or are not present. SYMPTOMS  Symptoms of acute and chronic sinusitis are the same. The primary symptoms are pain and pressure around the affected sinuses. Other symptoms include: Upper toothache. Earache. Headache. Bad breath. Decreased sense of smell and taste. A cough, which worsens when you are lying flat. Fatigue. Fever. Thick drainage from your nose, which often is green and may contain pus (purulent). Swelling and warmth over the affected sinuses. DIAGNOSIS  Your caregiver will perform a physical exam. During the exam, your caregiver may: Look in your nose  for signs of abnormal growths in your nostrils (nasal polyps). Tap over the affected sinus to check for signs of infection. View the inside of your sinuses (endoscopy) with a special imaging device with a light attached (endoscope), which is inserted into your sinuses. If your caregiver suspects that you have chronic sinusitis, one or more of  the following tests may be recommended: Allergy tests. Nasal culture A sample of mucus is taken from your nose and sent to a lab and screened for bacteria. Nasal cytology A sample of mucus is taken from your nose and examined by your caregiver to determine if your sinusitis is related to an allergy. TREATMENT  Most cases of acute sinusitis are related to a viral infection and will resolve on their own within 10 days. Sometimes medicines are prescribed to help relieve symptoms (pain medicine, decongestants, nasal steroid sprays, or saline sprays).  However, for sinusitis related to a bacterial infection, your caregiver will prescribe antibiotic medicines. These are medicines that will help kill the bacteria causing the infection.  Rarely, sinusitis is caused by a fungal infection. In theses cases, your caregiver will prescribe antifungal medicine. For some cases of chronic sinusitis, surgery is needed. Generally, these are cases in which sinusitis recurs more than 3 times per year, despite other treatments. HOME CARE INSTRUCTIONS  Drink plenty of water. Water helps thin the mucus so your sinuses can drain more easily. Use a humidifier. Inhale steam 3 to 4 times a day (for example, sit in the bathroom with the shower running). Apply a warm, moist washcloth to your face 3 to 4 times a day, or as directed by your caregiver. Use saline nasal sprays to help moisten and clean your sinuses. Take over-the-counter or prescription medicines for pain, discomfort, or fever only as directed by your caregiver. SEEK IMMEDIATE MEDICAL CARE IF: You have increasing pain or severe headaches. You have nausea, vomiting, or drowsiness. You have swelling around your face. You have vision problems. You have a stiff neck. You have difficulty breathing. MAKE SURE YOU:  Understand these instructions. Will watch your condition. Will get help right away if you are not doing well or get worse. Document Released:  09/01/2005 Document Revised: 11/24/2011 Document Reviewed: 09/16/2011 [facility] Patient Information 2014 [place], [place].    If you have been instructed to have an in-person evaluation today at a local Urgent Care facility, please use the link below. It will take you to a list of all of our available [facility], including address, phone number and hours of operation. Please do not delay care.  [facility]  If you or a family member do not have a primary care provider, use the link below to schedule a visit and establish care. When you choose a [facility] primary care physician or advanced practice provider, you gain a long-term partner in health. Find a Primary Care Provider  Learn more about 's in-office and virtual care options: [facility]

## 2021-03-08 NOTE — Progress Notes (Signed)
Virtual Visit Consent   Susan Walton, you are scheduled for a virtual visit with a [facility] provider today.     Just as with appointments in the office, your consent must be obtained to participate.  Your consent will be active for this visit and any virtual visit you may have with one of our providers in the next 365 days.     If you have a MyChart account, a copy of this consent can be sent to you electronically.  All virtual visits are billed to your insurance company just like a traditional visit in the office.    As this is a virtual visit, video technology does not allow for your provider to perform a traditional examination.  This may limit your provider's ability to fully assess your condition.  If your provider identifies any concerns that need to be evaluated in person or the need to arrange testing (such as labs, EKG, etc.), we will make arrangements to do so.     Although advances in technology are sophisticated, we cannot ensure that it will always work on either your end or our end.  If the connection with a video visit is poor, the visit may have to be switched to a telephone visit.  With either a video or telephone visit, we are not always able to ensure that we have a secure connection.     I need to obtain your verbal consent now.   Are you willing to proceed with your visit today?    [name] has provided verbal consent on 03/08/2021 for a virtual visit (video or telephone).   Susan Walton, [place]   Date: 03/08/2021 1:29 PM  Virtual Visit via Video Note   I, Susan Walton, connected with Sakinah Rosamond ([id number], 12-Jul-1981) on 03/08/21 at  1:15 PM EDT by a video-enabled telemedicine application and verified that I am speaking with the correct person using two identifiers.  Location: Patient: Virtual Visit Location Patient: Home Provider: Virtual Visit Location Provider: Home Office   I discussed the limitations of evaluation and management by  telemedicine and the availability of in person appointments. The patient expressed understanding and agreed to proceed.    History of Present Illness: Susan Walton is a 40 y.o. who identifies as a female who was assigned female at birth, and is being seen today for > 1 week sinus symptoms. Endorses a little over a week ago she used the laundry facility at her apartment complex which is in the basement of one of the buildings. While there she noted mold growing on the walls. States she quickly developed allergy symptoms which continued after leaving the facility. Then progressed to sinus congestion, headache, sinus pressure and fatigue. Over past couple of days has been having sinus pain and tooth pain. Denies any noted chest congestion or significant cough. Denies any recent travel or sick contact. Has been using loratadine, flonase and her rescue inhaler as needed.   HPI: HPI  Problems:  Patient Active Problem List   Diagnosis Date Noted   Acute respiratory failure with hypoxia ([place]) 01/20/2021   Asthma attack 01/19/2021   Asthma 01/19/2021   Group B Streptococcus carrier, +RV culture, currently pregnant 12/10/2017   Gonorrhea affecting pregnancy in first trimester 12/10/2017   Depression affecting pregnancy in third trimester, antepartum 10/20/2017   Pregnancy, supervision, high-risk 07/27/2017   Chronic hypertension with superimposed severe preeclampsia 07/27/2017   Drug abuse during pregnancy ([place]) 07/27/2017   Thyroid dysfunction, antepartum 07/27/2017  History of trichomoniasis 07/27/2017   Unwanted fertility 07/27/2017   Advanced maternal age in multigravida, second trimester    Adjustment disorder with depressed mood 06/05/2017    Allergies: No Known Allergies Medications:  Current Outpatient Medications:    amoxicillin-clavulanate (AUGMENTIN) 875-125 MG tablet, Take 1 tablet by mouth 2 (two) times daily., Disp: 14 tablet, Rfl: 0   albuterol (VENTOLIN HFA) 108 (90 Base) MCG/ACT  inhaler, Inhale 1-2 puffs into the lungs every 6 (six) hours as needed for wheezing or shortness of breath., Disp: 18 g, Rfl: 0   amLODipine (NORVASC) 10 MG tablet, Take 1 tablet (10 mg total) by mouth daily., Disp: 30 tablet, Rfl: 0   carvedilol (COREG) 6.25 MG tablet, Take 1 tablet (6.25 mg total) by mouth 2 (two) times daily with a meal., Disp: 60 tablet, Rfl: 0   fluticasone (FLONASE) 50 MCG/ACT nasal spray, Place 1 spray into both nostrils daily as needed for allergies., Disp: , Rfl:    loratadine (CLARITIN) 10 MG tablet, Take 10 mg by mouth daily as needed for allergies., Disp: , Rfl:    traZODone (DESYREL) 100 MG tablet, Take 100 mg by mouth at bedtime., Disp: , Rfl:   Observations/Objective: Patient is well-developed, well-nourished in no acute distress.  Resting comfortably  at home.  Head is normocephalic, atraumatic.  No labored breathing. Speech is clear and coherent with logical content.  Patient is alert and oriented at baseline.   Assessment and Plan: 1. Acute bacterial sinusitis - amoxicillin-clavulanate (AUGMENTIN) 875-125 MG tablet; Take 1 tablet by mouth 2 (two) times daily.  Dispense: 14 tablet; Refill: 0 Rx Augmentin.  Increase fluids.  Rest.  Saline nasal spray.  Probiotic.  Mucinex as directed.  Humidifier in bedroom. Continue loratadine and Flonase.    Follow Up Instructions: I discussed the assessment and treatment plan with the patient. The patient was provided an opportunity to ask questions and all were answered. The patient agreed with the plan and demonstrated an understanding of the instructions.  A copy of instructions were sent to the patient via MyChart.  The patient was advised to call back or seek an in-person evaluation if the symptoms worsen or if the condition fails to improve as anticipated.  Time:  I spent 11 minutes with the patient via telehealth technology discussing the above problems/concerns.    Susan Rio, PA-C

## 2021-03-14 ENCOUNTER — Encounter (HOSPITAL_COMMUNITY): Payer: Self-pay

## 2021-03-14 ENCOUNTER — Other Ambulatory Visit: Payer: Self-pay

## 2021-03-14 ENCOUNTER — Emergency Department (HOSPITAL_COMMUNITY)
Admission: EM | Admit: 2021-03-14 | Discharge: 2021-03-14 | Disposition: A | Discharge status: Home / Self Care | Payer: Medicaid Other | Attending: Emergency Medicine | Admitting: Emergency Medicine

## 2021-03-14 DIAGNOSIS — Z23 Encounter for immunization: Secondary | ICD-10-CM | POA: Insufficient documentation

## 2021-03-14 DIAGNOSIS — W268XXA Contact with other sharp object(s), not elsewhere classified, initial encounter: Secondary | ICD-10-CM | POA: Insufficient documentation

## 2021-03-14 DIAGNOSIS — Z79899 Other long term (current) drug therapy: Secondary | ICD-10-CM | POA: Diagnosis not present

## 2021-03-14 DIAGNOSIS — Y9339 Activity, other involving climbing, rappelling and jumping off: Secondary | ICD-10-CM | POA: Diagnosis not present

## 2021-03-14 DIAGNOSIS — Y92410 Unspecified street and highway as the place of occurrence of the external cause: Secondary | ICD-10-CM | POA: Insufficient documentation

## 2021-03-14 DIAGNOSIS — J45909 Unspecified asthma, uncomplicated: Secondary | ICD-10-CM | POA: Diagnosis not present

## 2021-03-14 DIAGNOSIS — S71112A Laceration without foreign body, left thigh, initial encounter: Secondary | ICD-10-CM | POA: Diagnosis not present

## 2021-03-14 DIAGNOSIS — E039 Hypothyroidism, unspecified: Secondary | ICD-10-CM | POA: Insufficient documentation

## 2021-03-14 DIAGNOSIS — Z87891 Personal history of nicotine dependence: Secondary | ICD-10-CM | POA: Insufficient documentation

## 2021-03-14 DIAGNOSIS — I10 Essential (primary) hypertension: Secondary | ICD-10-CM | POA: Diagnosis not present

## 2021-03-14 DIAGNOSIS — S79922A Unspecified injury of left thigh, initial encounter: Secondary | ICD-10-CM | POA: Diagnosis present

## 2021-03-14 MED ORDER — LIDOCAINE-EPINEPHRINE (PF) 2 %-1:200000 IJ SOLN
10.0000 mL | Freq: Once | INTRAMUSCULAR | Status: AC
  Administered 2021-03-14: 10 mL
  Filled 2021-03-14: qty 20

## 2021-03-14 MED ORDER — CEPHALEXIN 500 MG PO CAPS
500.0000 mg | ORAL_CAPSULE | Freq: Once | ORAL | Status: AC
  Administered 2021-03-14: 500 mg via ORAL
  Filled 2021-03-14: qty 1

## 2021-03-14 MED ORDER — IBUPROFEN 200 MG PO TABS
600.0000 mg | ORAL_TABLET | Freq: Once | ORAL | Status: AC
  Administered 2021-03-14: 600 mg via ORAL
  Filled 2021-03-14: qty 3

## 2021-03-14 MED ORDER — TETANUS-DIPHTH-ACELL PERTUSSIS 5-2.5-18.5 LF-MCG/0.5 IM SUSY
0.5000 mL | PREFILLED_SYRINGE | Freq: Once | INTRAMUSCULAR | Status: AC
  Administered 2021-03-14: 0.5 mL via INTRAMUSCULAR
  Filled 2021-03-14: qty 0.5

## 2021-03-14 MED ORDER — CEPHALEXIN 500 MG PO CAPS: 500.0000 mg | ORAL_CAPSULE | Freq: Two times a day (BID) | ORAL | 0 refills | Status: AC

## 2021-03-14 NOTE — Discharge Instructions (Addendum)
Please read instructions below.  Keep your wound clean and covered. In 24 hours, you can get your wound wet; gently clean it with soap and water, pat it dry, and reapply a clean bandage. You can take ibuprofen/advil every 6 hours as needed for pain Take the CEPHALEXIN (KEFLEX) every 12 hours until gone.  Follow up with your primary care or urgent care for wound recheck in 10 days.  Return to the ER for fever, pus draining from wound, redness, or new or worsening symptoms.

## 2021-03-14 NOTE — ED Provider Notes (Signed)
[facility] DEPT Provider Note   CSN: [id number] Arrival date & time: 03/14/21  1657     History Chief Complaint  Patient presents with   Extremity Laceration    Susan Walton is a 40 y.o. female presenting to the ED for evaluation of laceration to left thigh that occurred PTA. Patient states she was climbing over a the guardrail on the road and was not paying attention. She caught the posterior aspect of her distal thigh on the sharp edge of the guardrail causing laceration.  No numbness distal to the wound.  Tetanus was last updated about 7 years ago.  No history of immunocompromise.  No difficulty with movement of the leg.  No other injuries reported.  The history is provided by the patient.      Past Medical History:  Diagnosis Date   Anemia    Asthma attack 01/19/2021   Fibroid    Hypertension    Retinal detachment    Thyroid disease    Hypothyroidism    Patient Active Problem List   Diagnosis Date Noted   Acute respiratory failure with hypoxia ([place]) 01/20/2021   Asthma attack 01/19/2021   Asthma 01/19/2021   Group B Streptococcus carrier, +RV culture, currently pregnant 12/10/2017   Gonorrhea affecting pregnancy in first trimester 12/10/2017   Depression affecting pregnancy in third trimester, antepartum 10/20/2017   Pregnancy, supervision, high-risk 07/27/2017   Chronic hypertension with superimposed severe preeclampsia 07/27/2017   Drug abuse during pregnancy ([place]) 07/27/2017   Thyroid dysfunction, antepartum 07/27/2017   History of trichomoniasis 07/27/2017   Unwanted fertility 07/27/2017   Advanced maternal age in multigravida, second trimester    Adjustment disorder with depressed mood 06/05/2017    Past Surgical History:  Procedure Laterality Date   DILATION AND CURETTAGE OF UTERUS     EYE SURGERY     TUBAL LIGATION N/A 12/11/2017   Procedure: POST PARTUM TUBAL LIGATION;  Surgeon: [name], MD;  Location: [facility];  Service: Gynecology;  Laterality: N/A;     OB History     Gravida  8   Para  5   Term  5   Preterm  0   AB  3   Living  5      SAB  2   IAB  1   Ectopic  0   Multiple  0   Live Births  5           Family History  Problem Relation Age of Onset   COPD Maternal Grandfather    Liver disease Paternal Grandmother    Liver disease Paternal Grandfather     Social History   Tobacco Use   Smoking status: Former    Packs/day: 0.50    Pack years: 0.00    Types: Cigarettes   Smokeless tobacco: Never  Vaping Use   Vaping Use: Never used  Substance Use Topics   Alcohol use: Not Currently   Drug use: Not Currently    Types: Marijuana, Cocaine    Home Medications Prior to Admission medications   Medication Sig Start Date End Date Taking? Authorizing Provider  cephALEXin (KEFLEX) 500 MG capsule Take 1 capsule (500 mg total) by mouth 2 (two) times daily for 7 days. 03/14/21 03/21/21 Yes Quentin Cornwall, [place] N, PA-C  albuterol (VENTOLIN HFA) 108 (90 Base) MCG/ACT inhaler Inhale 1-2 puffs into the lungs every 6 (six) hours as needed for wheezing or shortness of breath. 11/13/20   Lamptey, Myrene Galas,  MD  amLODipine (NORVASC) 10 MG tablet Take 1 tablet (10 mg total) by mouth daily. 01/22/21   Little Ishikawa, MD  carvedilol (COREG) 6.25 MG tablet Take 1 tablet (6.25 mg total) by mouth 2 (two) times daily with a meal. 01/21/21   Little Ishikawa, MD  fluticasone [facility]) 50 MCG/ACT nasal spray Place 1 spray into both nostrils daily as needed for allergies. 09/12/20   [provider]  loratadine (CLARITIN) 10 MG tablet Take 10 mg by mouth daily as needed for allergies.    [provider]  traZODone (DESYREL) 100 MG tablet Take 100 mg by mouth at bedtime. 11/07/20   [provider]  labetalol (NORMODYNE) 100 MG tablet Take 1 tablet (100 mg total) by mouth 2 (two) times daily. 05/24/19 11/13/20  Jaynee Eagles, PA-C  NIFEdipine (PROCARDIA-XL/ADALAT  CC) 60 MG 24 hr tablet Take 1 tablet (60 mg total) by mouth 2 (two) times daily. Patient not taking: Reported on 03/28/2019 12/13/17 05/24/19  Osborne Oman, MD  omeprazole (PRILOSEC) 40 MG capsule Take 1 capsule (40 mg total) by mouth daily. 30 minutes before breakfast Patient not taking: Reported on 03/28/2019 05/12/18 05/24/19  Mauri Pole, MD    Allergies    Patient has no known allergies.  Review of Systems   Review of Systems  Skin:  Positive for wound.  Allergic/Immunologic: Negative for immunocompromised state.  Neurological:  Negative for numbness.  All other systems reviewed and are negative.  Physical Exam Updated Vital Signs BP (!) 160/106   Pulse 75   Temp 98.3 F (36.8 C) (Oral)   Resp 18   SpO2 99%   Physical Exam Vitals and nursing note reviewed.  Constitutional:      Appearance: She is well-developed.  HENT:     Head: Normocephalic and atraumatic.  Eyes:     Conjunctiva/sclera: Conjunctivae normal.  Cardiovascular:     Rate and Rhythm: Normal rate.  Pulmonary:     Effort: Pulmonary effort is normal.  Skin:    Comments: Jagged and gaping wound to left inferior postero-medial thigh. Measures about 2cm by 1 cm.  Base of wound visualized in a bloodless field.  No evidence of violation to the fascia.  Wound depth appears to be as deep as subcutaneous tissue.  Not grossly contaminated.  Neurological:     Mental Status: She is alert.  Psychiatric:        Mood and Affect: Mood normal.        Behavior: Behavior normal.    ED Results / Procedures / Treatments   Labs (all labs ordered are listed, but only abnormal results are displayed) Labs Reviewed - No data to display  EKG None  Radiology No results found.  Procedures .Marland KitchenLaceration Repair  Date/Time: 03/14/2021 6:56 PM Performed by: Venie Montesinos, [place] N, PA-C Authorized by: Jiali Linney, [place] N, PA-C   Consent:    Consent obtained:  Verbal   Consent given by:  Patient   Risks discussed:   Infection, poor cosmetic result and pain Universal protocol:    Patient identity confirmed:  Verbally with patient Anesthesia:    Anesthesia method:  Local infiltration   Local anesthetic:  Lidocaine 2% WITH epi Laceration details:    Location:  Leg   Leg location:  L upper leg   Length (cm):  2 Pre-procedure details:    Preparation:  Patient was prepped and draped in usual sterile fashion Exploration:    Limited defect created (wound extended): no  Hemostasis achieved with:  Direct pressure   Wound exploration: wound explored through full range of motion and entire depth of wound visualized     Wound extent: no foreign bodies/material noted and no muscle damage noted     Contaminated: no   Treatment:    Area cleansed with:  Saline   Amount of cleaning:  Standard   Irrigation volume:  1L Skin repair:    Repair method:  Sutures   Suture size:  3-0   Wound skin closure material used: ethilon.   Suture technique:  Horizontal mattress and simple interrupted   Number of sutures:  3 Approximation:    Approximation:  Close Repair type:    Repair type:  Simple Post-procedure details:    Dressing:  Non-adherent dressing   Procedure completion:  Tolerated well, no immediate complications Comments:     2 horizontal mattress sutures and 1 simple interrupted placed with adequate wound approximation   Medications Ordered in ED Medications  cephALEXin (KEFLEX) capsule 500 mg (has no administration in time range)  ibuprofen (ADVIL) tablet 600 mg (600 mg Oral Given 03/14/21 1802)  lidocaine-EPINEPHrine (XYLOCAINE W/EPI) 2 %-1:200000 (PF) injection 10 mL (10 mLs Infiltration Given by Other 03/14/21 1802)  Tdap (BOOSTRIX) injection 0.5 mL (0.5 mLs Intramuscular Given 03/14/21 1802)    ED Course  I have reviewed the triage vital signs and the nursing notes.  Pertinent labs & imaging results that were available during my care of the patient were reviewed by me and considered in my medical  decision making (see chart for details).    MDM Rules/Calculators/A&P                          Pt with laceration to left posterior thigh that occurred today. Preserved active ROM of extremity. Wound explored and base of wound visualized in a bloodless field without evidence of foreign body. Laceration occurred < 8 hours prior to repair which was well tolerated.  Tdap updated/up-to-date. Discharged on keflex for prophylactic coverage due to patient's description of object  Discussed suture home care with patient and answered questions. Pt to follow-up for wound check and suture removal in 10 days; they are to return to the ED sooner for signs of infection. Pt is hemodynamically stable with no complaints prior to dc.   Discussed results, findings, treatment and follow up. Patient advised of return precautions. Patient verbalized understanding and agreed with plan.  Final Clinical Impression(s) / ED Diagnoses Final diagnoses:  Laceration of left thigh, initial encounter    Rx / DC Orders ED Discharge Orders          Ordered    cephALEXin (KEFLEX) 500 MG capsule  2 times daily        03/14/21 1903             Clariece Roesler, [place] N, PA-C 03/14/21 1905    Valarie Merino, MD 03/14/21 [phone]

## 2021-03-14 NOTE — ED Triage Notes (Signed)
Pt states that today she was climbing over a dirty, rusty guardrail and her left leg was cut, cut is to upper thigh.

## 2021-03-14 NOTE — ED Notes (Signed)
An After Visit Summary was printed and given to the patient. Discharge instructions given and no further questions at this time.  

## 2021-04-01 ENCOUNTER — Other Ambulatory Visit: Payer: Self-pay

## 2021-04-01 ENCOUNTER — Emergency Department (HOSPITAL_COMMUNITY)
Admission: EM | Admit: 2021-04-01 | Discharge: 2021-04-01 | Disposition: A | Discharge status: Home / Self Care | Payer: Medicaid Other | Attending: Emergency Medicine | Admitting: Emergency Medicine

## 2021-04-01 DIAGNOSIS — Z20822 Contact with and (suspected) exposure to covid-19: Secondary | ICD-10-CM | POA: Insufficient documentation

## 2021-04-01 DIAGNOSIS — I10 Essential (primary) hypertension: Secondary | ICD-10-CM | POA: Diagnosis not present

## 2021-04-01 DIAGNOSIS — J45909 Unspecified asthma, uncomplicated: Secondary | ICD-10-CM | POA: Insufficient documentation

## 2021-04-01 DIAGNOSIS — E039 Hypothyroidism, unspecified: Secondary | ICD-10-CM | POA: Insufficient documentation

## 2021-04-01 DIAGNOSIS — J00 Acute nasopharyngitis [common cold]: Secondary | ICD-10-CM

## 2021-04-01 DIAGNOSIS — Z87891 Personal history of nicotine dependence: Secondary | ICD-10-CM | POA: Diagnosis not present

## 2021-04-01 DIAGNOSIS — R040 Epistaxis: Secondary | ICD-10-CM | POA: Diagnosis not present

## 2021-04-01 DIAGNOSIS — R112 Nausea with vomiting, unspecified: Secondary | ICD-10-CM | POA: Diagnosis not present

## 2021-04-01 DIAGNOSIS — R0981 Nasal congestion: Secondary | ICD-10-CM | POA: Diagnosis present

## 2021-04-01 DIAGNOSIS — Z79899 Other long term (current) drug therapy: Secondary | ICD-10-CM | POA: Insufficient documentation

## 2021-04-01 LAB — SARS CORONAVIRUS 2 (TAT 6-24 HRS): SARS Coronavirus 2: NEGATIVE

## 2021-04-01 MED ORDER — SALINE SPRAY 0.65 % NA SOLN: 1.0000 | NASAL | 0 refills | Status: DC | PRN

## 2021-04-01 NOTE — ED Triage Notes (Signed)
C/o bloody nose, NV + fever x 1 week; denies blood thinners.

## 2021-04-01 NOTE — Discharge Instructions (Addendum)
1.  Stop using Flonase.  You may use saline nasal misting sprays such as Ocean spray if you need for discomfort. 2.  May continue using your Claritin. 3.  Make an appointment with Dr. Janeice Robinson office. 4.  Return to emergency department develop fevers, facial pain and swelling or other concerning symptoms.

## 2021-04-01 NOTE — ED Provider Notes (Signed)
Emergency Medicine Provider Triage Evaluation Note  Susan Walton , a 40 y.o. female  was evaluated in triage.  Pt complains of nasal polyps bilaterally for the past week who has been bleeding intermittently. Also complains of subjective fevers for the past couple of days and a headache. She would like a referral to an ENT to see if she can have her polyps removed.   Review of Systems  Positive: + headache, fever, epistaxis Negative:   Physical Exam  BP (!) 142/90 (BP Location: Right Arm)   Pulse 92   Temp 98.6 F (37 C) (Oral)   Resp 15   Ht 5\' 7"  (1.702 m)   Wt 73.9 kg   LMP 03/10/2021 (Approximate)   SpO2 100%   BMI 25.53 kg/m  Gen:   Awake, no distress   Resp:  Normal effort  MSK:   Moves extremities without difficulty  Other:  Congested.   Medical Decision Making  Medically screening exam initiated at 11:58 AM.  Appropriate orders placed.  [name] was informed that the remainder of the evaluation will be completed by another provider, this initial triage assessment does not replace that evaluation, and the importance of remaining in the ED until their evaluation is complete.     Eustaquio Maize, PA-C 04/01/21 1200    Charlesetta Shanks, MD 04/11/21 [phone]

## 2021-04-01 NOTE — ED Provider Notes (Signed)
[facility] EMERGENCY DEPARTMENT Provider Note   CSN: [id number] Arrival date & time: 04/01/21  1110     History Chief Complaint  Patient presents with   Fever   Epistaxis   Nausea   Emesis    Susan Walton is a 40 y.o. female.  HPI Patient reports that she is got polyps in her nose that she noticed about 2 weeks ago.  She reports she has had months of pretty severe nasal congestion and persistent drainage.  She reports that over the past couple weeks now she is intermittently getting nasal bleeding as well and now has observed white, thickened polyp looking material within her nasal passages.  She reports they are always extremely congested.  Patient reports that she has been on Keflex but it was for treatment of a laceration she has on her leg.  She is just now finishing a 7-day course.  She reports she has been using Flonase about every other day for the past 2 months and taking Claritin daily.  She reports over the past week she has had some subjective fevers and chills.  She has not measured her temperature.  He is felt slightly nauseated.  Patient had a fairly recent admission for hypertension.  She is taking her blood pressure medications now.  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  Patient reports last use was about 5 months ago.  She reports she went through a period of about 5 years of on and off again cocaine use and has been abstinent for about 5 months.  Prior smoking history.  Not currently smoking.    Past Medical History:  Diagnosis Date   Anemia    Asthma attack 01/19/2021   Fibroid    Hypertension    Retinal detachment    Thyroid disease    Hypothyroidism    Patient Active Problem List   Diagnosis Date Noted   Acute respiratory failure with hypoxia ([place]) 01/20/2021   Asthma attack 01/19/2021   Asthma 01/19/2021   Group B Streptococcus carrier, +RV culture, currently pregnant 12/10/2017   Gonorrhea affecting pregnancy in first trimester  12/10/2017   Depression affecting pregnancy in third trimester, antepartum 10/20/2017   Pregnancy, supervision, high-risk 07/27/2017   Chronic hypertension with superimposed severe preeclampsia 07/27/2017   Drug abuse during pregnancy ([place]) 07/27/2017   Thyroid dysfunction, antepartum 07/27/2017   History of trichomoniasis 07/27/2017   Unwanted fertility 07/27/2017   Advanced maternal age in multigravida, second trimester    Adjustment disorder with depressed mood 06/05/2017    Past Surgical History:  Procedure Laterality Date   DILATION AND CURETTAGE OF UTERUS     EYE SURGERY     TUBAL LIGATION N/A 12/11/2017   Procedure: POST PARTUM TUBAL LIGATION;  Surgeon: [name], MD;  Location: [facility];  Service: Gynecology;  Laterality: N/A;     OB History     Gravida  8   Para  5   Term  5   Preterm  0   AB  3   Living  5      SAB  2   IAB  1   Ectopic  0   Multiple  0   Live Births  5           Family History  Problem Relation Age of Onset   COPD Maternal Grandfather    Liver disease Paternal Grandmother    Liver disease Paternal Grandfather     Social History  Tobacco Use   Smoking status: Former    Packs/day: 0.50    Types: Cigarettes   Smokeless tobacco: Never  Vaping Use   Vaping Use: Never used  Substance Use Topics   Alcohol use: Not Currently   Drug use: Not Currently    Types: Marijuana, Cocaine    Home Medications Prior to Admission medications   Medication Sig Start Date End Date Taking? Authorizing Provider  sodium chloride (OCEAN) 0.65 % SOLN nasal spray Place 1 spray into both nostrils as needed for congestion. 04/01/21  Yes Charlesetta Shanks, MD  albuterol (VENTOLIN HFA) 108 (90 Base) MCG/ACT inhaler Inhale 1-2 puffs into the lungs every 6 (six) hours as needed for wheezing or shortness of breath. 11/13/20   Chase Picket, MD  amLODipine (NORVASC) 10 MG tablet Take 1 tablet (10 mg total) by mouth daily. 01/22/21    Little Ishikawa, MD  carvedilol (COREG) 6.25 MG tablet Take 1 tablet (6.25 mg total) by mouth 2 (two) times daily with a meal. 01/21/21   Little Ishikawa, MD  fluticasone [facility]) 50 MCG/ACT nasal spray Place 1 spray into both nostrils daily as needed for allergies. 09/12/20   [provider]  loratadine (CLARITIN) 10 MG tablet Take 10 mg by mouth daily as needed for allergies.    [provider]  traZODone (DESYREL) 100 MG tablet Take 100 mg by mouth at bedtime. 11/07/20   [provider]  labetalol (NORMODYNE) 100 MG tablet Take 1 tablet (100 mg total) by mouth 2 (two) times daily. 05/24/19 11/13/20  Jaynee Eagles, PA-C  NIFEdipine (PROCARDIA-XL/ADALAT CC) 60 MG 24 hr tablet Take 1 tablet (60 mg total) by mouth 2 (two) times daily. Patient not taking: Reported on 03/28/2019 12/13/17 05/24/19  Osborne Oman, MD  omeprazole (PRILOSEC) 40 MG capsule Take 1 capsule (40 mg total) by mouth daily. 30 minutes before breakfast Patient not taking: Reported on 03/28/2019 05/12/18 05/24/19  Mauri Pole, MD    Allergies    Patient has no known allergies.  Review of Systems   Review of Systems 10 systems reviewed and negative except as per HPI Physical Exam Updated Vital Signs BP (!) 142/90 (BP Location: Right Arm)   Pulse 92   Temp 98.6 F (37 C) (Oral)   Resp 15   Ht 5\' 7"  (1.702 m)   Wt 73.9 kg   LMP 03/10/2021 (Approximate)   SpO2 100%   BMI 25.53 kg/m   Physical Exam Constitutional:      Comments: Alert nontoxic.  No respiratory distress.  Mental status clear  HENT:     Head: Normocephalic and atraumatic.     Right Ear: Tympanic membrane normal.     Left Ear: Tympanic membrane normal.     Nose:     Comments: Patient has thickened, whitish-gray, crenulated eschar appearing material over both of her turbinates.  This obstructs most of the visualization of the deeper nasal passage.  She has clear thin nasal discharge in conjunction with this.  No  swelling of the face or outer soft tissues.  Airway is widely patent.  Dentition is in good condition.  Posterior airway without erythema exudate or postnasal drip observable. 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  She reports this is from old injury.  Right eye is normal.  Neither has periorbital edema or erythema  Cardiovascular:     Rate and Rhythm: Normal rate and regular rhythm.  Pulmonary:  Effort: Pulmonary effort is normal.     Breath sounds: Normal breath sounds.  Abdominal:     General: There is no distension.     Palpations: Abdomen is soft.  Musculoskeletal:        General: Normal range of motion.     Cervical back: Neck supple.  Skin:    General: Skin is warm and dry.  Neurological:     General: No focal deficit present.     Mental Status: She is oriented to person, place, and time.     Coordination: Coordination normal.  Psychiatric:        Mood and Affect: Mood normal.        ED Results / Procedures / Treatments   Labs (all labs ordered are listed, but only abnormal results are displayed) Labs Reviewed  SARS CORONAVIRUS 2 (TAT 6-24 HRS)    EKG None  Radiology No results found.  Procedures Procedures   Medications Ordered in ED Medications - No data to display  ED Course  I have reviewed the triage vital signs and the nursing notes.  Pertinent labs & imaging results that were available during my care of the patient were reviewed by me and considered in my medical decision making (see chart for details).    MDM Rules/Calculators/A&P                          Patient has a exudative appearing eschar over both of the turbinates inferiorly.  She is nontoxic.  No facial swelling or pain to suggest facial cellulitis or significant bacterial sinusitis.  Patient has been using Flonase for about 2 months.  She also has a distant history of cocaine use but likely not within the past 5 months.  At this time, patient  is nontoxic and stable for follow-up.  Case was reviewed with Dr. Constance Holster.  At this time recommendation is to discontinue Flonase or any nasal treatment aside from saline spray as needed and follow-up in the office for recheck. Final Clinical Impression(s) / ED Diagnoses Final diagnoses:  Acute rhinitis  Epistaxis    Rx / DC Orders ED Discharge Orders          Ordered    sodium chloride (OCEAN) 0.65 % SOLN nasal spray  As needed        04/01/21 1551             Charlesetta Shanks, MD 04/01/21 1553

## 2021-06-10 ENCOUNTER — Ambulatory Visit (INDEPENDENT_AMBULATORY_CARE_PROVIDER_SITE_OTHER): Payer: Medicaid Other | Admitting: Licensed Clinical Social Worker

## 2021-06-10 ENCOUNTER — Encounter (HOSPITAL_COMMUNITY): Payer: Self-pay | Admitting: Licensed Clinical Social Worker

## 2021-06-10 ENCOUNTER — Ambulatory Visit (HOSPITAL_COMMUNITY): Payer: Self-pay | Admitting: Licensed Clinical Social Worker

## 2021-06-10 ENCOUNTER — Other Ambulatory Visit: Payer: Self-pay

## 2021-06-10 DIAGNOSIS — F259 Schizoaffective disorder, unspecified: Secondary | ICD-10-CM | POA: Diagnosis not present

## 2021-06-10 DIAGNOSIS — F411 Generalized anxiety disorder: Secondary | ICD-10-CM

## 2021-06-10 DIAGNOSIS — F4310 Post-traumatic stress disorder, unspecified: Secondary | ICD-10-CM | POA: Diagnosis not present

## 2021-06-10 NOTE — Plan of Care (Signed)
Pt agreeable to plan  ?
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
## 2021-06-24 ENCOUNTER — Other Ambulatory Visit: Payer: Self-pay

## 2021-06-24 ENCOUNTER — Ambulatory Visit (INDEPENDENT_AMBULATORY_CARE_PROVIDER_SITE_OTHER): Payer: Medicaid Other | Admitting: Licensed Clinical Social Worker

## 2021-06-24 DIAGNOSIS — F411 Generalized anxiety disorder: Secondary | ICD-10-CM

## 2021-06-24 DIAGNOSIS — F259 Schizoaffective disorder, unspecified: Secondary | ICD-10-CM

## 2021-06-24 DIAGNOSIS — F4310 Post-traumatic stress disorder, unspecified: Secondary | ICD-10-CM

## 2021-06-24 NOTE — Progress Notes (Addendum)
   THERAPIST PROGRESS NOTE  Session Time: 58  Virtual Visit via Video Note  I connected with Andrey Spearman on 06/25/21 at  1:00 PM EDT by a video enabled telemedicine application and verified that I am speaking with the correct person using two identifiers.  Location: Patient: [facility] Provider: [facility]    I discussed the limitations of evaluation and management by telemedicine and the availability of in person appointments. The patient expressed understanding and agreed to proceed.      I discussed the assessment and treatment plan with the patient. The patient was provided an opportunity to ask questions and all were answered. The patient agreed with the plan and demonstrated an understanding of the instructions.   The patient was advised to call back or seek an in-person evaluation if the symptoms worsen or if the condition fails to improve as anticipated.  I provided 16 minutes of non-face-to-face time during this encounter.   Dory Horn, LCSW   Participation Level: Active  Behavioral Response: CasualAlertDepressed  Type of Therapy: Individual Therapy  Treatment Goals addressed: Coping  Interventions: CBT and Supportive  Summary: Susan Walton is a 40 y.o. female who presents with depressed and anxious mood\affect.  Patient was pleasant, cooperative, and engaged well in therapy session.Susan Walton was alert and oriented x5.  He was dressed casually and engaged well.   Primary stressor for today is illness.  Patient reports that she was not feeling well due to a cough and nasal congestion.  Patient reports that she utilized Robitussin and Benadryl to help with her symptoms.  At that time or later that night she also took her trazodone and patient reports confusion dizziness and muscle cramping.  She states that this has been going on since late Thursday night into Friday morning.  LCSW advised patient to follow-up with her primary care physician as symptoms  have not improved in the last 3 days.  LCSW did asked about suicidal and homicidal ideations and patient denied any SI or HI in today's session.  Suicidal/Homicidal: NAwithout intent/plan  Therapist Response:    Intervention/Plan: LCSW utilized solution focused therapy today.  Patient's primary stressor was illness.  LCSW advised [facility] house and also for follow-up with her primary care physician or [name] who prescribed her trazodone to her.  Patient was agreeable to follow-up with resources provided and states that she will follow-up with LCSW in 4 weeks at her next session.   Plan: Return again in 4 weeks.     Dory Horn, LCSW 06/24/2021

## 2021-06-26 ENCOUNTER — Other Ambulatory Visit: Payer: Self-pay

## 2021-06-26 ENCOUNTER — Emergency Department (HOSPITAL_COMMUNITY)
Admission: EM | Admit: 2021-06-26 | Discharge: 2021-06-27 | Disposition: A | Discharge status: Other Acute Inpatient Hospital | Payer: Medicaid Other | Attending: Emergency Medicine | Admitting: Emergency Medicine

## 2021-06-26 ENCOUNTER — Emergency Department (HOSPITAL_COMMUNITY): Payer: Medicaid Other

## 2021-06-26 ENCOUNTER — Encounter (HOSPITAL_COMMUNITY): Payer: Self-pay

## 2021-06-26 DIAGNOSIS — F32A Depression, unspecified: Secondary | ICD-10-CM | POA: Diagnosis not present

## 2021-06-26 DIAGNOSIS — R110 Nausea: Secondary | ICD-10-CM | POA: Diagnosis not present

## 2021-06-26 DIAGNOSIS — J45909 Unspecified asthma, uncomplicated: Secondary | ICD-10-CM | POA: Diagnosis not present

## 2021-06-26 DIAGNOSIS — Z87891 Personal history of nicotine dependence: Secondary | ICD-10-CM | POA: Diagnosis not present

## 2021-06-26 DIAGNOSIS — I10 Essential (primary) hypertension: Secondary | ICD-10-CM | POA: Insufficient documentation

## 2021-06-26 DIAGNOSIS — R1011 Right upper quadrant pain: Secondary | ICD-10-CM

## 2021-06-26 DIAGNOSIS — R002 Palpitations: Secondary | ICD-10-CM | POA: Diagnosis not present

## 2021-06-26 DIAGNOSIS — R45851 Suicidal ideations: Secondary | ICD-10-CM

## 2021-06-26 DIAGNOSIS — R109 Unspecified abdominal pain: Secondary | ICD-10-CM | POA: Diagnosis present

## 2021-06-26 DIAGNOSIS — R1084 Generalized abdominal pain: Secondary | ICD-10-CM

## 2021-06-26 DIAGNOSIS — N9489 Other specified conditions associated with female genital organs and menstrual cycle: Secondary | ICD-10-CM | POA: Insufficient documentation

## 2021-06-26 DIAGNOSIS — Z20822 Contact with and (suspected) exposure to covid-19: Secondary | ICD-10-CM | POA: Diagnosis not present

## 2021-06-26 DIAGNOSIS — Y900 Blood alcohol level of less than 20 mg/100 ml: Secondary | ICD-10-CM | POA: Insufficient documentation

## 2021-06-26 DIAGNOSIS — Z79899 Other long term (current) drug therapy: Secondary | ICD-10-CM | POA: Diagnosis not present

## 2021-06-26 DIAGNOSIS — E039 Hypothyroidism, unspecified: Secondary | ICD-10-CM | POA: Insufficient documentation

## 2021-06-26 LAB — COMPREHENSIVE METABOLIC PANEL
ALT: 8 U/L (ref 0–44)
AST: 12 U/L — ABNORMAL LOW (ref 15–41)
Albumin: 4.1 g/dL (ref 3.5–5.0)
Alkaline Phosphatase: 41 U/L (ref 38–126)
Anion gap: 9 (ref 5–15)
BUN: 13 mg/dL (ref 6–20)
CO2: 24 mmol/L (ref 22–32)
Calcium: 9.5 mg/dL (ref 8.9–10.3)
Chloride: 101 mmol/L (ref 98–111)
Creatinine, Ser: 0.71 mg/dL (ref 0.44–1.00)
GFR, Estimated: >60 mL/min (ref >60)
Glucose, Bld: 90 mg/dL (ref 70–99)
Potassium: 3.6 mmol/L (ref 3.5–5.1)
Sodium: 134 mmol/L — ABNORMAL LOW (ref 135–145)
Total Bilirubin: 0.4 mg/dL (ref 0.3–1.2)
Total Protein: 7.7 g/dL (ref 6.5–8.1)

## 2021-06-26 LAB — CBC WITH DIFFERENTIAL/PLATELET
Abs Immature Granulocytes: 0.03 10*3/uL (ref 0.00–0.07)
Basophils Absolute: 0 10*3/uL (ref 0.0–0.1)
Basophils Relative: 1 %
Eosinophils Absolute: 0.6 10*3/uL — ABNORMAL HIGH (ref 0.0–0.5)
Eosinophils Relative: 7 %
HCT: 38.3 % (ref 36.0–46.0)
Hemoglobin: 11.9 g/dL — ABNORMAL LOW (ref 12.0–15.0)
Immature Granulocytes: 0 %
Lymphocytes Relative: 37 %
Lymphs Abs: 2.9 10*3/uL (ref 0.7–4.0)
MCH: 22.8 pg — ABNORMAL LOW (ref 26.0–34.0)
MCHC: 31.1 g/dL (ref 30.0–36.0)
MCV: 73.5 fL — ABNORMAL LOW (ref 80.0–100.0)
Monocytes Absolute: 0.5 10*3/uL (ref 0.1–1.0)
Monocytes Relative: 6 %
Neutro Abs: 4 10*3/uL (ref 1.7–7.7)
Neutrophils Relative %: 49 %
Platelets: 357 10*3/uL (ref 150–400)
RBC: 5.21 MIL/uL — ABNORMAL HIGH (ref 3.87–5.11)
RDW: 18.4 % — ABNORMAL HIGH (ref 11.5–15.5)
WBC: 7.9 10*3/uL (ref 4.0–10.5)
nRBC: 0 % (ref 0.0–0.2)

## 2021-06-26 LAB — RAPID URINE DRUG SCREEN, HOSP PERFORMED
Amphetamines: NOT DETECTED
Barbiturates: NOT DETECTED
Benzodiazepines: NOT DETECTED
Cocaine: NOT DETECTED
Opiates: NOT DETECTED
Tetrahydrocannabinol: POSITIVE — AB

## 2021-06-26 LAB — ETHANOL: Alcohol, Ethyl (B): <10 mg/dL (ref <10)

## 2021-06-26 LAB — I-STAT BETA HCG BLOOD, ED (MC, WL, AP ONLY): I-stat hCG, quantitative: <5 m[IU]/mL (ref <5)

## 2021-06-26 MED ORDER — ONDANSETRON HCL 4 MG/2ML IJ SOLN
4.0000 mg | Freq: Once | INTRAMUSCULAR | Status: AC
  Administered 2021-06-27: 4 mg via INTRAVENOUS
  Filled 2021-06-26: qty 2

## 2021-06-26 NOTE — ED Notes (Signed)
Pt called x1 in ED lobby. Currently not present and did not respond to name call.

## 2021-06-26 NOTE — ED Provider Notes (Signed)
Emergency Medicine Provider Triage Evaluation Note  Susan Walton , a 40 y.o. female  was evaluated in triage.  Pt complains of thoughts of hurting her self.  She reports that she doesn't have a plan.  She denies any physical complaints or concerns.  No fevers.  She denies HI or avh.  Reports increased anxiety.  She states that her kids told her to come in .  She hasn't been sleeping well.   Review of Systems  Positive: SI,  Negative: HI, AVH.   Physical Exam  BP (!) 165/122   Pulse 100   Temp 99.6 F (37.6 C) (Oral)   Resp 20   Ht 5\' 7"  (1.702 m)   Wt 77.1 kg   LMP 06/03/2021 (Approximate)   SpO2 99%   BMI 26.63 kg/m  Gen:   Awake, no distress   Resp:  Normal effort  MSK:   Moves extremities without difficulty  Other:  Normal speech Not responding to internal stimuli.    Medical Decision Making  Medically screening exam initiated at 7:47 PM.  Appropriate orders placed.  Kately Graffam was informed that the remainder of the evaluation will be completed by another provider, this initial triage assessment does not replace that evaluation, and the importance of remaining in the ED until their evaluation is complete.  Note: Portions of this report may have been transcribed using voice recognition software. Every effort was made to ensure accuracy; however, inadvertent computerized transcription errors may be present    Ollen Gross 06/26/21 1949    Dorie Rank, MD 06/27/21 1430

## 2021-06-26 NOTE — ED Triage Notes (Addendum)
Patient reports that she has been having "bad reactions " to her psychiatric medications. Patient states she is having thoughts of dying, but does not have a plan. Patient states,"My kids asked me to come." Patient states her daughter has been looking out for her x 4-5 days. I have a 40 year old and a 40 year old. Patient states she was taking antihistamines with her Trazadone and states she has been vomiting, not sleeping, increased anxiety, confusion, muscle tightness, not being able to breathe."

## 2021-06-26 NOTE — ED Provider Notes (Signed)
[facility] DEPT Provider Note   CSN: [id number] Arrival date & time: 06/26/21  1653     History Chief Complaint  Patient presents with   Suicidal   Depression    Susan Walton is a 40 y.o. female.  Patient presents to the emergency department with a chief complaint of suicidal thoughts.  She states that she has been sick recently dealing with abdominal pains and nasal polyps.  She states that she feels overwhelmed in her life and has no desire to live.  She would like to speak with a counselor about her depression and anxiety.  She also complains of generalized abdominal pain that is worsened with movement and palpation.  She reports associated nausea, but denies any vomiting.  Denies any fevers or chills.  The history is provided by the patient. No language interpreter was used.      Past Medical History:  Diagnosis Date   Anemia    Asthma attack 01/19/2021   Fibroid    Hypertension    Retinal detachment    Thyroid disease    Hypothyroidism    Patient Active Problem List   Diagnosis Date Noted   Schizoaffective disorder ([place]) 06/10/2021   PTSD (post-traumatic stress disorder) 06/10/2021   GAD (generalized anxiety disorder) 06/10/2021   Acute respiratory failure with hypoxia ([place]) 01/20/2021   Asthma attack 01/19/2021   Asthma 01/19/2021   Group B Streptococcus carrier, +RV culture, currently pregnant 12/10/2017   Gonorrhea affecting pregnancy in first trimester 12/10/2017   Depression affecting pregnancy in third trimester, antepartum 10/20/2017   Pregnancy, supervision, high-risk 07/27/2017   Chronic hypertension with superimposed severe preeclampsia 07/27/2017   Drug abuse during pregnancy ([place]) 07/27/2017   Thyroid dysfunction, antepartum 07/27/2017   History of trichomoniasis 07/27/2017   Unwanted fertility 07/27/2017   Advanced maternal age in multigravida, second trimester    Adjustment disorder with depressed mood 06/05/2017     Past Surgical History:  Procedure Laterality Date   DILATION AND CURETTAGE OF UTERUS     EYE SURGERY     TUBAL LIGATION N/A 12/11/2017   Procedure: POST PARTUM TUBAL LIGATION;  Surgeon: [name], MD;  Location: [place];  Service: Gynecology;  Laterality: N/A;     OB History     Gravida  8   Para  5   Term  5   Preterm  0   AB  3   Living  5      SAB  2   IAB  1   Ectopic  0   Multiple  0   Live Births  5           Family History  Problem Relation Age of Onset   COPD Maternal Grandfather    Liver disease Paternal Grandmother    Liver disease Paternal Grandfather     Social History   Tobacco Use   Smoking status: Former    Packs/day: 0.50    Types: Cigarettes   Smokeless tobacco: Never  Vaping Use   Vaping Use: Never used  Substance Use Topics   Alcohol use: Not Currently   Drug use: Not Currently    Home Medications Prior to Admission medications   Medication Sig Start Date End Date Taking? Authorizing Provider  acetaminophen (TYLENOL) 500 MG tablet Take 1,000 mg by mouth every 3 (three) hours as needed (for severe pain).   Yes [provider]  bismuth subsalicylate (PEPTO BISMOL) 262 MG/15ML suspension Take 30 mLs by mouth  every 6 (six) hours as needed for indigestion.   Yes [provider]  diphenhydrAMINE (BENADRYL) 25 MG tablet Take 25-50 mg by mouth every 6 (six) hours as needed for allergies.   Yes [provider]  fluticasone (FLONASE) 50 MCG/ACT nasal spray Place 2 sprays into both nostrils daily. 09/12/20  Yes [provider]  guaiFENesin-dextromethorphan (ROBITUSSIN DM) 100-10 MG/5ML syrup Take 15 mLs by mouth every 4 (four) hours as needed for cough.   Yes [provider]  PROAIR HFA 108 (90 Base) MCG/ACT inhaler Inhale 2 puffs into the lungs every 2 (two) hours as needed for shortness of breath.   Yes [provider]  sodium chloride (OCEAN) 0.65 % SOLN nasal  spray Place 1 spray into both nostrils as needed for congestion. 04/01/21  Yes Charlesetta Shanks, MD  albuterol (VENTOLIN HFA) 108 (90 Base) MCG/ACT inhaler Inhale 1-2 puffs into the lungs every 6 (six) hours as needed for wheezing or shortness of breath. Patient not taking: Reported on 06/26/2021 11/13/20   Chase Picket, MD  amLODipine (NORVASC) 10 MG tablet Take 1 tablet (10 mg total) by mouth daily. 01/22/21   Little Ishikawa, MD  carvedilol (COREG) 6.25 MG tablet Take 1 tablet (6.25 mg total) by mouth 2 (two) times daily with a meal. Patient not taking: No sig reported 01/21/21   Little Ishikawa, MD  hydrOXYzine (VISTARIL) 25 MG capsule Take 25 mg by mouth 3 (three) times daily as needed for anxiety.    [provider]  LATUDA 80 MG TABS tablet Take 80 mg by mouth daily with breakfast.    [provider]  ondansetron (ZOFRAN) 4 MG tablet Take 4 mg by mouth every 6 (six) hours as needed for nausea or vomiting.    [provider]  pramipexole (MIRAPEX) 0.25 MG tablet Take 0.25 mg by mouth at bedtime.    [provider]  prazosin (MINIPRESS) 2 MG capsule Take 2 mg by mouth at bedtime.    [provider]  traZODone (DESYREL) 100 MG tablet Take 100 mg by mouth at bedtime. Patient not taking: No sig reported 11/07/20   [provider]  labetalol (NORMODYNE) 100 MG tablet Take 1 tablet (100 mg total) by mouth 2 (two) times daily. 05/24/19 11/13/20  Jaynee Eagles, PA-C  NIFEdipine (PROCARDIA-XL/ADALAT CC) 60 MG 24 hr tablet Take 1 tablet (60 mg total) by mouth 2 (two) times daily. Patient not taking: Reported on 03/28/2019 12/13/17 05/24/19  Osborne Oman, MD  omeprazole (PRILOSEC) 40 MG capsule Take 1 capsule (40 mg total) by mouth daily. 30 minutes before breakfast Patient not taking: Reported on 03/28/2019 05/12/18 05/24/19  Mauri Pole, MD    Allergies    Other  Review of Systems   Review of Systems  All other systems reviewed and are  negative.  Physical Exam Updated Vital Signs BP (!) 165/122   Pulse 100   Temp 99.6 F (37.6 C) (Oral)   Resp 20   Ht 5\' 7"  (1.702 m)   Wt 77.1 kg   LMP 06/03/2021 (Approximate)   SpO2 99%   BMI 26.63 kg/m   Physical Exam Vitals and nursing note reviewed.  Constitutional:      General: She is not in acute distress.    Appearance: She is well-developed.  HENT:     Head: Normocephalic and atraumatic.  Eyes:     Conjunctiva/sclera: Conjunctivae normal.  Cardiovascular:     Rate and Rhythm: Normal rate and  regular rhythm.     Heart sounds: No murmur heard. Pulmonary:     Effort: Pulmonary effort is normal. No respiratory distress.     Breath sounds: Normal breath sounds.  Abdominal:     Palpations: Abdomen is soft.     Tenderness: There is abdominal tenderness.  Musculoskeletal:        General: Normal range of motion.     Cervical back: Neck supple.  Skin:    General: Skin is warm and dry.  Neurological:     Mental Status: She is alert and oriented to person, place, and time.  Psychiatric:        Mood and Affect: Mood normal.        Behavior: Behavior normal.    ED Results / Procedures / Treatments   Labs (all labs ordered are listed, but only abnormal results are displayed) Labs Reviewed  COMPREHENSIVE METABOLIC PANEL - Abnormal; Notable for the following components:      Result Value   Sodium 134 (*)    AST 12 (*)    All other components within normal limits  CBC WITH DIFFERENTIAL/PLATELET - Abnormal; Notable for the following components:   RBC 5.21 (*)    Hemoglobin 11.9 (*)    MCV 73.5 (*)    MCH 22.8 (*)    RDW 18.4 (*)    Eosinophils Absolute 0.6 (*)    All other components within normal limits  RESP PANEL BY RT-PCR (FLU A&B, COVID) ARPGX2  ETHANOL  RAPID URINE DRUG SCREEN, HOSP PERFORMED  I-STAT BETA HCG BLOOD, ED ([facility], [facility], [facility] ONLY)    EKG None  Radiology DG Chest 2 View  Result Date: 06/26/2021 CLINICAL DATA:  Shortness of breath EXAM:  CHEST - 2 VIEW COMPARISON:  01/19/2021 FINDINGS: The heart size and mediastinal contours are within normal limits. Both lungs are clear. The visualized skeletal structures are unremarkable. IMPRESSION: No active cardiopulmonary disease. Electronically Signed   By: Merilyn Baba M.D.   On: 06/26/2021 18:50    Procedures Procedures   Medications Ordered in ED Medications  ondansetron (ZOFRAN) injection 4 mg (has no administration in time range)    ED Course  I have reviewed the triage vital signs and the nursing notes.  Pertinent labs & imaging results that were available during my care of the patient were reviewed by me and considered in my medical decision making (see chart for details).    MDM Rules/Calculators/A&P                           Patient here with suicidal thoughts and abdominal pain.  She does have some moderate tenderness on exam, will check CT.  Laboratory work-up is fairly reassuring.  After CT, patient will be medically clear for TTS evaluation.  Labs and imaging are reassuring.  May have some esophagitis was seen on CT.  She is given a GI cocktail.  She appears medically clear for TTS evaluation. Final Clinical Impression(s) / ED Diagnoses Final diagnoses:  Suicidal ideation  Generalized abdominal pain    Rx / DC Orders ED Discharge Orders     None        Montine Circle, PA-C 04/88/89 1694    Delora Fuel, MD 50/38/88 [phone]

## 2021-06-27 ENCOUNTER — Emergency Department (HOSPITAL_COMMUNITY): Payer: Medicaid Other

## 2021-06-27 ENCOUNTER — Encounter (HOSPITAL_COMMUNITY): Payer: Self-pay

## 2021-06-27 ENCOUNTER — Other Ambulatory Visit (HOSPITAL_COMMUNITY)
Admission: EM | Admit: 2021-06-27 | Discharge: 2021-06-30 | Disposition: A | Discharge status: Home / Self Care | Payer: Medicaid Other | Attending: Family | Admitting: Family

## 2021-06-27 DIAGNOSIS — F4310 Post-traumatic stress disorder, unspecified: Secondary | ICD-10-CM

## 2021-06-27 DIAGNOSIS — R1030 Lower abdominal pain, unspecified: Secondary | ICD-10-CM | POA: Insufficient documentation

## 2021-06-27 DIAGNOSIS — F259 Schizoaffective disorder, unspecified: Secondary | ICD-10-CM | POA: Diagnosis not present

## 2021-06-27 DIAGNOSIS — R45851 Suicidal ideations: Secondary | ICD-10-CM | POA: Insufficient documentation

## 2021-06-27 DIAGNOSIS — F411 Generalized anxiety disorder: Secondary | ICD-10-CM

## 2021-06-27 DIAGNOSIS — Z9151 Personal history of suicidal behavior: Secondary | ICD-10-CM | POA: Insufficient documentation

## 2021-06-27 DIAGNOSIS — Z87891 Personal history of nicotine dependence: Secondary | ICD-10-CM | POA: Insufficient documentation

## 2021-06-27 DIAGNOSIS — F332 Major depressive disorder, recurrent severe without psychotic features: Secondary | ICD-10-CM | POA: Diagnosis not present

## 2021-06-27 DIAGNOSIS — F32A Depression, unspecified: Secondary | ICD-10-CM

## 2021-06-27 DIAGNOSIS — Z79899 Other long term (current) drug therapy: Secondary | ICD-10-CM | POA: Insufficient documentation

## 2021-06-27 LAB — RESP PANEL BY RT-PCR (FLU A&B, COVID) ARPGX2
Influenza A by PCR: NEGATIVE
Influenza B by PCR: NEGATIVE
SARS Coronavirus 2 by RT PCR: NEGATIVE

## 2021-06-27 MED ORDER — HYDROXYZINE HCL 25 MG PO TABS
25.0000 mg | ORAL_TABLET | Freq: Three times a day (TID) | ORAL | Status: DC | PRN
  Administered 2021-06-27: 25 mg via ORAL
  Filled 2021-06-27 (×2): qty 1

## 2021-06-27 MED ORDER — LURASIDONE HCL 40 MG PO TABS
80.0000 mg | ORAL_TABLET | Freq: Every day | ORAL | Status: DC
  Administered 2021-06-27: 80 mg via ORAL
  Filled 2021-06-27 (×2): qty 2

## 2021-06-27 MED ORDER — PRAZOSIN HCL 1 MG PO CAPS: 2.0000 mg | ORAL_CAPSULE | Freq: Every day | ORAL | Status: DC

## 2021-06-27 MED ORDER — AMLODIPINE BESYLATE 10 MG PO TABS
10.0000 mg | ORAL_TABLET | Freq: Every day | ORAL | Status: DC
  Administered 2021-06-28 – 2021-06-30 (×3): 10 mg via ORAL
  Filled 2021-06-27 (×3): qty 1

## 2021-06-27 MED ORDER — PRAZOSIN HCL 2 MG PO CAPS
2.0000 mg | ORAL_CAPSULE | Freq: Every day | ORAL | Status: DC
  Administered 2021-06-27 – 2021-06-29 (×3): 2 mg via ORAL
  Filled 2021-06-27 (×3): qty 1

## 2021-06-27 MED ORDER — LURASIDONE HCL 40 MG PO TABS
80.0000 mg | ORAL_TABLET | Freq: Every day | ORAL | Status: DC
  Administered 2021-06-28: 80 mg via ORAL
  Filled 2021-06-27 (×2): qty 2

## 2021-06-27 MED ORDER — HYDROXYZINE HCL 25 MG PO TABS
25.0000 mg | ORAL_TABLET | Freq: Three times a day (TID) | ORAL | Status: DC | PRN
  Administered 2021-06-27 (×2): 25 mg via ORAL
  Filled 2021-06-27 (×2): qty 1

## 2021-06-27 MED ORDER — HYDROXYZINE PAMOATE 25 MG PO CAPS: 25.0000 mg | ORAL_CAPSULE | Freq: Three times a day (TID) | ORAL | Status: DC | PRN

## 2021-06-27 MED ORDER — ALBUTEROL SULFATE HFA 108 (90 BASE) MCG/ACT IN AERS
2.0000 | INHALATION_SPRAY | RESPIRATORY_TRACT | Status: DC | PRN
  Administered 2021-06-27: 2 via RESPIRATORY_TRACT
  Filled 2021-06-27: qty 6.7

## 2021-06-27 MED ORDER — TRAMADOL HCL 50 MG PO TABS
50.0000 mg | ORAL_TABLET | Freq: Once | ORAL | Status: AC
  Administered 2021-06-27: 50 mg via ORAL
  Filled 2021-06-27: qty 1

## 2021-06-27 MED ORDER — ALUM & MAG HYDROXIDE-SIMETH 200-200-20 MG/5ML PO SUSP
30.0000 mL | Freq: Once | ORAL | Status: AC
  Administered 2021-06-27: 30 mL via ORAL
  Filled 2021-06-27: qty 30

## 2021-06-27 MED ORDER — MAGNESIUM HYDROXIDE 400 MG/5ML PO SUSP: 30.0000 mL | Freq: Every day | ORAL | Status: DC | PRN

## 2021-06-27 MED ORDER — ALUM & MAG HYDROXIDE-SIMETH 200-200-20 MG/5ML PO SUSP: 30.0000 mL | Freq: Once | ORAL | Status: DC

## 2021-06-27 MED ORDER — ACETAMINOPHEN 325 MG PO TABS
650.0000 mg | ORAL_TABLET | Freq: Four times a day (QID) | ORAL | Status: DC | PRN
  Administered 2021-06-27 – 2021-06-30 (×7): 650 mg via ORAL
  Filled 2021-06-27 (×7): qty 2

## 2021-06-27 MED ORDER — FENTANYL CITRATE PF 50 MCG/ML IJ SOSY
50.0000 ug | PREFILLED_SYRINGE | Freq: Once | INTRAMUSCULAR | Status: AC
Start: 2021-06-27 — End: 2021-06-27
  Administered 2021-06-27: 50 ug via INTRAVENOUS
  Filled 2021-06-27: qty 1

## 2021-06-27 MED ORDER — IOHEXOL 350 MG/ML SOLN
80.0000 mL | Freq: Once | INTRAVENOUS | Status: AC | PRN
  Administered 2021-06-27: 80 mL via INTRAVENOUS

## 2021-06-27 MED ORDER — ALBUTEROL SULFATE HFA 108 (90 BASE) MCG/ACT IN AERS
2.0000 | INHALATION_SPRAY | RESPIRATORY_TRACT | Status: DC | PRN
  Administered 2021-06-27 – 2021-06-30 (×3): 2 via RESPIRATORY_TRACT
  Filled 2021-06-27: qty 6.7

## 2021-06-27 MED ORDER — ALUM & MAG HYDROXIDE-SIMETH 200-200-20 MG/5ML PO SUSP
30.0000 mL | ORAL | Status: DC | PRN
  Filled 2021-06-27: qty 30

## 2021-06-27 MED ORDER — AMLODIPINE BESYLATE 5 MG PO TABS
10.0000 mg | ORAL_TABLET | Freq: Every day | ORAL | Status: DC
  Administered 2021-06-27: 10 mg via ORAL
  Filled 2021-06-27: qty 2

## 2021-06-27 MED ORDER — PRAMIPEXOLE DIHYDROCHLORIDE 0.25 MG PO TABS: 0.2500 mg | ORAL_TABLET | Freq: Every day | ORAL | Status: DC

## 2021-06-27 MED ORDER — LORAZEPAM 1 MG PO TABS
1.0000 mg | ORAL_TABLET | Freq: Once | ORAL | Status: AC
  Administered 2021-06-27: 1 mg via ORAL
  Filled 2021-06-27: qty 1

## 2021-06-27 MED ORDER — AMLODIPINE BESYLATE 5 MG PO TABS: 10.0000 mg | ORAL_TABLET | Freq: Every day | ORAL | Status: DC

## 2021-06-27 MED ORDER — HYDROXYZINE HCL 25 MG PO TABS: 25.0000 mg | ORAL_TABLET | Freq: Three times a day (TID) | ORAL | Status: DC | PRN

## 2021-06-27 MED ORDER — PRAZOSIN HCL 1 MG PO CAPS
2.0000 mg | ORAL_CAPSULE | Freq: Every day | ORAL | Status: DC
  Administered 2021-06-27: 2 mg via ORAL
  Filled 2021-06-27: qty 2

## 2021-06-27 MED ORDER — PRAMIPEXOLE DIHYDROCHLORIDE 0.25 MG PO TABS
0.2500 mg | ORAL_TABLET | Freq: Every day | ORAL | Status: DC
  Administered 2021-06-27: 0.25 mg via ORAL
  Filled 2021-06-27: qty 1

## 2021-06-27 MED ORDER — PRAMIPEXOLE DIHYDROCHLORIDE 0.25 MG PO TABS
0.2500 mg | ORAL_TABLET | Freq: Every day | ORAL | Status: DC
  Administered 2021-06-27 – 2021-06-29 (×3): 0.25 mg via ORAL
  Filled 2021-06-27 (×5): qty 1

## 2021-06-27 MED ORDER — ONDANSETRON HCL 4 MG/2ML IJ SOLN: 4.0000 mg | Freq: Once | INTRAMUSCULAR | Status: DC

## 2021-06-27 NOTE — ED Notes (Signed)
This nurse went and spoke with the provider Corene Cornea), of the patients request, an order for tramadol was put in for patient.
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
## 2021-06-27 NOTE — ED Provider Notes (Addendum)
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 Final   RDW 01/21/2021 17.0 (A) 11.5 - 15.5 % Final   Platelets 01/21/2021 421 (A) 150 - 400 K/uL  Final   nRBC 01/21/2021 0.0  0.0 - 0.2 % Final   Performed at [facility], [place] [address], [place], [place] [address]   Sodium 01/21/2021 138  135 - 145 mmol/L Final   Potassium 01/21/2021 3.9  3.5 - 5.1 mmol/L Final   Comment: DELTA CHECK NOTED NO VISIBLE HEMOLYSIS    Chloride 01/21/2021 103  98 - 111 mmol/L Final   CO2 01/21/2021 29  22 - 32 mmol/L Final   Glucose, Bld 01/21/2021 98  70 - 99 mg/dL Final   Glucose reference range applies only to samples taken after fasting for at least 8 hours.   BUN 01/21/2021 16  6 - 20 mg/dL Final   Creatinine, Ser 01/21/2021 0.73  0.44 - 1.00 mg/dL Final   Calcium 01/21/2021 9.2  8.9 - 10.3 mg/dL Final   Total Protein 01/21/2021 7.7  6.5 - 8.1 g/dL Final   Albumin  01/21/2021 3.7  3.5 - 5.0 g/dL Final   AST 01/21/2021 9 (A) 15 - 41 U/L Final   ALT 01/21/2021 8  0 - 44 U/L Final   Alkaline Phosphatase 01/21/2021 46  38 - 126 U/L Final   Total Bilirubin 01/21/2021 0.2 (A) 0.3 - 1.2 mg/dL Final   GFR, Estimated 01/21/2021 >60  >60 mL/min Final   Comment: (NOTE) Calculated using the CKD-EPI Creatinine Equation (2021)    Anion gap 01/21/2021 6  5 - 15 Final   Performed at [facility], [place] [address], [place], [place] [address]    Allergies: Other  PTA Medications: (Not in a hospital admission)   Medical Decision Making  40 year old woman with history of schizoaffective disorder, GAD, who presented to [facility] yesterday and chief complaint suicidal thoughts.  She was assessed by TTS and psych NP who recommended that she be transferred to the [facility] after medical clearance in the ED.   Labs reviewed-see CMP with mildly low sodium at 134 and low AST of 12 otherwise within normal limits.  CBC indicative of anemia.  UDS positive for THC, alcohol negative.  EKG reviewed from 10/12-QTC 454.Patient was transferred to the [facility] today for further treatment, safety, stabilization.  On interview today, she reports SI, HI  and paranoia. Restarted home medications as below. She remains appropriate for [facility].   Schizoaffective disorder Anxiety PTSD -continue latuda 80 mg -prn vistaril -continue prazosin 2 mg qhs  HTN -continue norvasc 10 mg -home medication of carvidolol 6.25 mg listed but patient reported not taking per med rec-will hold for now and consider restarting if BP is elevated  Asthma -continue albuterol  Dispo: Ongoing. Will consult with SW.     Recommendations  Based on my evaluation the patient does not appear to have an emergency medical condition.  Ival Bible, MD 06/27/21  4:35 PM

## 2021-06-27 NOTE — ED Notes (Addendum)
PT is complaining of real bad stomach cramps. Pt was advised that she has a PRN order for 650 mg of tylenol. Pt asked if she could have 1000 mg as that is what she takes at home. This nurse advised the patient I would have to speak with the provider because the order is only for 650. The patient stated at the emergency room she was given fentanyl. This nurse advised the patient that there is no order for fentanyl in her medication orders.

## 2021-06-27 NOTE — Progress Notes (Signed)
Patient reports severe lower abdominal pain. Patient lying in bed screaming. Upon palpation patient reports worsening lower abdominal pain. No guarding. No rebound tenderness. Denies nausea, vomiting, diarrhea. Reports last bowel movement 06/26/2021. Patient requests fentanyl for pain. Informed that we do not administer fentanyl in this setting. Reviewed Abdominal CT and ultrasound results from 06/27/2021 with patient. CT and ultrasound revealed no acute findings.   Patient agreeable to trying tramadol for pain.

## 2021-06-27 NOTE — Progress Notes (Addendum)
Called to unit by nursing staff. Patient is in the bathroom sitting on the toilet. Patient is drowsy. Continues to scream out intermittently stating that she is in pain. Upon palpation of abdomen, patient does not express tenderness. No guarding noted.   Due to patient's continued expression of severe abdominal pain, will transfer the patient to the ED for medical clearance. Discussed patient with Dr. Darl Householder who agrees that the patient is likely not experiencing a medical emergency. Dr. Darl Householder agrees to accept patient. [facility] AC feels that the patient needs medical clearance in the ED.   Patient can return to [facility] after medical clearance.  Patient reported resolution of pain after she was informed that there would be a long wait in the ED.

## 2021-06-27 NOTE — ED Notes (Signed)
TTS in progress 

## 2021-06-27 NOTE — ED Notes (Addendum)
Pt belonging bags placed in triage nursing station cabinet. Pt has 2 belonging bags

## 2021-06-27 NOTE — BH Assessment (Signed)
[facility] [place] Assessment Progress Note   Per Sheran Fava, NP, this pt would benefit from admission to [facility] at this time.  Ernie Hew, MD has agreed to accept pt.  Pt has signed Voluntary Admission and Consent for Treatment, as well as Consent to Release Information to [facility] staff and to her mother, and signed forms have been faxed to [facility].  EDP Malvin Johns, MD and pt's nurse, Sharrie Rothman, have been notified, and Sharrie Rothman agrees to send original paperwork along with pt via [employer], and to call report to [phone].  Jalene Mullet, [place] Coordinator [phone]

## 2021-06-27 NOTE — Consult Note (Signed)
[facility] Psych ED Progress Note  06/27/2021 11:13 AM Susan Walton  MRN:  [id number]   Method of visit?: Face to Face   Subjective:  Patient presents for suicidal thoughts with no plan, and homicidal ideations towards her ex partner. SHe also has some ongoing obsessive and intrusive thoughts of hearing deceased spouse voice. She continues to endorse suicidal ideations, worsening depression, and anxiety. She is able to contract for safety.   Brief note completed as patient was just seen by TTS 3 hours prior.  She meets criteria for crisis stabilization at [facility].   Principal Problem: <principal problem not specified> Diagnosis:  Active Problems:   * No active hospital problems. *  Total Time spent with patient: 30 minutes  Past Psychiatric History: MDD  Past Medical History:  Past Medical History:  Diagnosis Date   Anemia    Asthma attack 01/19/2021   Fibroid    Hypertension    Retinal detachment    Thyroid disease    Hypothyroidism    Past Surgical History:  Procedure Laterality Date   DILATION AND CURETTAGE OF UTERUS     EYE SURGERY     TUBAL LIGATION N/A 12/11/2017   Procedure: POST PARTUM TUBAL LIGATION;  Surgeon: [name], MD;  Location: [place];  Service: Gynecology;  Laterality: N/A;   Family History:  Family History  Problem Relation Age of Onset   COPD Maternal Grandfather    Liver disease Paternal Grandmother    Liver disease Paternal Grandfather    Family Psychiatric  History: Denies Social History:  Social History   Substance and Sexual Activity  Alcohol Use Not Currently     Social History   Substance and Sexual Activity  Drug Use Not Currently    Social History   Socioeconomic History   Marital status: Single    Spouse name: Not on file   Number of children: Not on file   Years of education: Not on file   Highest education level: Not on file  Occupational History   Not on file  Tobacco Use    Smoking status: Former    Packs/day: 0.50    Types: Cigarettes   Smokeless tobacco: Never  Vaping Use   Vaping Use: Never used  Substance and Sexual Activity   Alcohol use: Not Currently   Drug use: Not Currently   Sexual activity: Yes    Partners: Male    Birth control/protection: Condom    Comment: multiple parteners  Other Topics Concern   Not on file  Social History Narrative   ** Merged History Encounter **       Social Determinants of Health   Financial Resource Strain: High Risk   Difficulty of Paying Living Expenses: Very hard  Food Insecurity: No Food Insecurity   Worried About Charity fundraiser in the Last Year: Never true   Ran Out of Food in the Last Year: Never true  Transportation Needs: No Transportation Needs   Lack of Transportation (Medical): No   Lack of Transportation (Non-Medical): No  Physical Activity: Insufficiently Active   Days of Exercise per Week: 2 days   Minutes of Exercise per Session: 20 min  Stress: Stress Concern Present   Feeling of Stress : Very much  Social Connections: Socially Isolated   Frequency of Communication with Friends and Family: Never   Frequency of Social Gatherings with Friends and Family: Never   Attends Religious Services: Never   Active Member of  Clubs or Organizations: No   Attends Archivist Meetings: Never   Marital Status: Divorced    Sleep: Poor  Appetite:  Fair  Current Medications: Current Facility-Administered Medications  Medication Dose Route Frequency Provider Last Rate Last Admin   albuterol (VENTOLIN HFA) 108 (90 Base) MCG/ACT inhaler 2 puff  2 puff Inhalation Q2H PRN Montine Circle, PA-C   2 puff at 06/27/21 0300   amLODipine (NORVASC) tablet 10 mg  10 mg Oral Daily Montine Circle, PA-C   10 mg at 06/27/21 7096   hydrOXYzine (ATARAX/VISTARIL) tablet 25 mg  25 mg Oral TID PRN Montine Circle, PA-C   25 mg at 06/27/21 0413   lurasidone (LATUDA) tablet 80 mg  80 mg Oral Q  breakfast Montine Circle, PA-C       pramipexole (MIRAPEX) tablet 0.25 mg  0.25 mg Oral QHS Montine Circle, PA-C   0.25 mg at 06/27/21 0413   prazosin (MINIPRESS) capsule 2 mg  2 mg Oral QHS Montine Circle, PA-C   2 mg at 06/27/21 2836   Current Outpatient Medications  Medication Sig Dispense Refill   acetaminophen (TYLENOL) 500 MG tablet Take 1,000 mg by mouth every 3 (three) hours as needed (for severe pain).     bismuth subsalicylate (PEPTO BISMOL) 262 MG/15ML suspension Take 30 mLs by mouth every 6 (six) hours as needed for indigestion.     diphenhydrAMINE (BENADRYL) 25 MG tablet Take 25-50 mg by mouth every 6 (six) hours as needed for allergies.     fluticasone (FLONASE) 50 MCG/ACT nasal spray Place 2 sprays into both nostrils daily.     guaiFENesin-dextromethorphan (ROBITUSSIN DM) 100-10 MG/5ML syrup Take 15 mLs by mouth every 4 (four) hours as needed for cough.     PROAIR HFA 108 (90 Base) MCG/ACT inhaler Inhale 2 puffs into the lungs every 2 (two) hours as needed for shortness of breath.     sodium chloride (OCEAN) 0.65 % SOLN nasal spray Place 1 spray into both nostrils as needed for congestion. 30 mL 0   albuterol (VENTOLIN HFA) 108 (90 Base) MCG/ACT inhaler Inhale 1-2 puffs into the lungs every 6 (six) hours as needed for wheezing or shortness of breath. (Patient not taking: Reported on 06/26/2021) 18 g 0   amLODipine (NORVASC) 10 MG tablet Take 1 tablet (10 mg total) by mouth daily. 30 tablet 0   carvedilol (COREG) 6.25 MG tablet Take 1 tablet (6.25 mg total) by mouth 2 (two) times daily with a meal. (Patient not taking: No sig reported) 60 tablet 0   hydrOXYzine (VISTARIL) 25 MG capsule Take 25 mg by mouth 3 (three) times daily as needed for anxiety.     LATUDA 80 MG TABS tablet Take 80 mg by mouth daily with breakfast.     ondansetron (ZOFRAN) 4 MG tablet Take 4 mg by mouth every 6 (six) hours as needed for nausea or vomiting.     pramipexole (MIRAPEX) 0.25 MG tablet Take 0.25  mg by mouth at bedtime.     prazosin (MINIPRESS) 2 MG capsule Take 2 mg by mouth at bedtime.     traZODone (DESYREL) 100 MG tablet Take 100 mg by mouth at bedtime. (Patient not taking: No sig reported)      Lab Results:  Results for orders placed or performed during the hospital encounter of 06/26/21 (from the past 48 hour(s))  Comprehensive metabolic panel     Status: Abnormal   Collection Time: 06/26/21  7:08 PM  Result Value Ref Range  Sodium 134 (L) 135 - 145 mmol/L   Potassium 3.6 3.5 - 5.1 mmol/L   Chloride 101 98 - 111 mmol/L   CO2 24 22 - 32 mmol/L   Glucose, Bld 90 70 - 99 mg/dL    Comment: Glucose reference range applies only to samples taken after fasting for at least 8 hours.   BUN 13 6 - 20 mg/dL   Creatinine, Ser 0.71 0.44 - 1.00 mg/dL   Calcium 9.5 8.9 - 10.3 mg/dL   Total Protein 7.7 6.5 - 8.1 g/dL   Albumin 4.1 3.5 - 5.0 g/dL   AST 12 (L) 15 - 41 U/L   ALT 8 0 - 44 U/L   Alkaline Phosphatase 41 38 - 126 U/L   Total Bilirubin 0.4 0.3 - 1.2 mg/dL   GFR, Estimated >60 >60 mL/min    Comment: (NOTE) Calculated using the CKD-EPI Creatinine Equation (2021)    Anion gap 9 5 - 15    Comment: Performed at [facility], [place] [address], [place], [place] [address]  Ethanol     Status: None   Collection Time: 06/26/21  7:08 PM  Result Value Ref Range   Alcohol, Ethyl (B) <10 <10 mg/dL    Comment: (NOTE) Lowest detectable limit for serum alcohol is 10 mg/dL.  For medical purposes only. Performed at [facility], [place] [address], [place], [place] [address]   CBC with Diff     Status: Abnormal   Collection Time: 06/26/21  7:08 PM  Result Value Ref Range   WBC 7.9 4.0 - 10.5 K/uL   RBC 5.21 (H) 3.87 - 5.11 MIL/uL   Hemoglobin 11.9 (L) 12.0 - 15.0 g/dL   HCT 38.3 36.0 - 46.0 %   MCV 73.5 (L) 80.0 - 100.0 fL   MCH 22.8 (L) 26.0 - 34.0 pg   MCHC 31.1 30.0 - 36.0 g/dL   RDW 18.4 (H) 11.5 - 15.5 %   Platelets 357 150 - 400 K/uL    nRBC 0.0 0.0 - 0.2 %   Neutrophils Relative % 49 %   Neutro Abs 4.0 1.7 - 7.7 K/uL   Lymphocytes Relative 37 %   Lymphs Abs 2.9 0.7 - 4.0 K/uL   Monocytes Relative 6 %   Monocytes Absolute 0.5 0.1 - 1.0 K/uL   Eosinophils Relative 7 %   Eosinophils Absolute 0.6 (H) 0.0 - 0.5 K/uL   Basophils Relative 1 %   Basophils Absolute 0.0 0.0 - 0.1 K/uL   Immature Granulocytes 0 %   Abs Immature Granulocytes 0.03 0.00 - 0.07 K/uL    Comment: Performed at [facility], [place] [address], [place], [place] [address]  I-Stat beta hCG blood, ED     Status: None   Collection Time: 06/26/21  7:27 PM  Result Value Ref Range   I-stat hCG, quantitative <5.0 <5 mIU/mL   Comment 3            Comment:   GEST. AGE      CONC.  (mIU/mL)   <=1 WEEK        5 - 50     2 WEEKS       50 - 500     3 WEEKS       100 - 10,000     4 WEEKS     1,000 - 30,000        FEMALE AND NON-PREGNANT FEMALE:     LESS THAN 5 mIU/mL  Urine rapid drug screen (hosp performed)     Status: Abnormal   Collection Time: 06/26/21  9:17 PM  Result Value Ref Range   Opiates NONE DETECTED NONE DETECTED   Cocaine NONE DETECTED NONE DETECTED   Benzodiazepines NONE DETECTED NONE DETECTED   Amphetamines NONE DETECTED NONE DETECTED   Tetrahydrocannabinol POSITIVE (A) NONE DETECTED   Barbiturates NONE DETECTED NONE DETECTED    Comment: (NOTE) DRUG SCREEN FOR MEDICAL PURPOSES ONLY.  IF CONFIRMATION IS NEEDED FOR ANY PURPOSE, NOTIFY LAB WITHIN 5 DAYS.  LOWEST DETECTABLE LIMITS FOR URINE DRUG SCREEN Drug Class                     Cutoff (ng/mL) Amphetamine and metabolites    1000 Barbiturate and metabolites    200 Benzodiazepine                 502 Tricyclics and metabolites     300 Opiates and metabolites        300 Cocaine and metabolites        300 THC                            50 Performed at [facility], [place] [address] [place], [place] [address]   Resp Panel by RT-PCR (Flu A&B, Covid)  Nasopharyngeal Swab     Status: None   Collection Time: 06/26/21 11:00 PM   Specimen: Nasopharyngeal Swab; Nasopharyngeal(NP) swabs in vial transport medium  Result Value Ref Range   SARS Coronavirus 2 by RT PCR NEGATIVE NEGATIVE    Comment: (NOTE) SARS-CoV-2 target nucleic acids are NOT DETECTED.  The SARS-CoV-2 RNA is generally detectable in upper respiratory specimens during the acute phase of infection. The lowest concentration of SARS-CoV-2 viral copies this assay can detect is 138 copies/mL. A negative result does not preclude SARS-Cov-2 infection and should not be used as the sole basis for treatment or other patient management decisions. A negative result may occur with  improper specimen collection/handling, submission of specimen other than nasopharyngeal swab, presence of viral mutation(s) within the areas targeted by this assay, and inadequate number of viral copies(<138 copies/mL). A negative result must be combined with clinical observations, patient history, and epidemiological information. The expected result is Negative.  Fact Sheet for Patients:  EntrepreneurPulse.com.au  Fact Sheet for Healthcare Providers:  IncredibleEmployment.be  This test is no t yet approved or cleared by the [place] FDA and  has been authorized for detection and/or diagnosis of SARS-CoV-2 by FDA under an Emergency Use Authorization (EUA). This EUA will remain  in effect (meaning this test can be used) for the duration of the COVID-19 declaration under Section 564(b)(1) of the Act, 21 U.S.C.section 360bbb-3(b)(1), unless the authorization is terminated  or revoked sooner.       Influenza A by PCR NEGATIVE NEGATIVE   Influenza B by PCR NEGATIVE NEGATIVE    Comment: (NOTE) The Xpert Xpress SARS-CoV-2/FLU/RSV plus assay is intended as an aid in the diagnosis of influenza from Nasopharyngeal swab specimens and should not be used as a sole basis for  treatment. Nasal washings and aspirates are unacceptable for Xpert Xpress SARS-CoV-2/FLU/RSV testing.  Fact Sheet for Patients: EntrepreneurPulse.com.au  Fact Sheet for Healthcare Providers: IncredibleEmployment.be  This test is not yet approved or cleared by the [place] FDA and has been authorized for detection and/or diagnosis of SARS-CoV-2 by FDA under an Emergency Use Authorization (EUA). This  EUA will remain in effect (meaning this test can be used) for the duration of the COVID-19 declaration under Section 564(b)(1) of the Act, 21 U.S.C. section 360bbb-3(b)(1), unless the authorization is terminated or revoked.  Performed at [facility], [place] [address], [place], [place] [address]     Blood Alcohol level:  Lab Results  Component Value Date   ETH <10 06/26/2021   ETH <5 06/05/2017    Physical Findings: AIMS:  , ,  ,  ,    CIWA:    COWS:     Musculoskeletal: Strength & Muscle Tone: within normal limits Gait & Station: normal Patient leans: N/A  Psychiatric Specialty Exam:  Presentation  General Appearance: Disheveled  Eye Contact:None  Speech:Clear and Coherent; Normal Rate  Speech Volume:Normal  Handedness:Right   Mood and Affect  Mood:Depressed; Dysphoric; Hopeless; Worthless  Affect:Depressed; Social worker Processes:Linear  Descriptions of Associations:Intact  Orientation:Full (Time, Place and Person)  Thought Content:Logical  History of Schizophrenia/Schizoaffective disorder:No  Duration of Psychotic Symptoms:N/A  Hallucinations:Hallucinations: None  Ideas of Reference:None  Suicidal Thoughts:Suicidal Thoughts: Yes, Active SI Active Intent and/or Plan: With Intent; Without Plan; Without Means to Carry Out; Without Access to Means  Homicidal Thoughts:Homicidal Thoughts: No   Sensorium  Memory:Immediate Fair; Recent Fair; Remote  Fair  Judgment:Fair  Insight:Fair   Executive Functions  Concentration:Fair  Attention Span:Fair  Walker   Psychomotor Activity  Psychomotor Activity:Psychomotor Activity: Normal   Assets  Assets:Communication Skills; Leisure Time; Desire for Improvement; Financial Resources/Insurance; Housing; Resilience; Social Support   Sleep  Sleep:Sleep: Fair    Physical Exam: Physical Exam ROS Blood pressure 121/76, pulse 98, temperature 99.6 F (37.6 C), temperature source Oral, resp. rate 20, height 5\' 7"  (1.702 m), weight 77.1 kg, last menstrual period 06/03/2021, SpO2 99 %. Body mass index is 26.63 kg/m.  Treatment Plan Summary: Plan WIll need crisis stabilization, medication management, and therapy. At this time she is under review at [facility] for possible admission.   Suella Broad, FNP 06/27/2021, 11:13 AM

## 2021-06-27 NOTE — ED Notes (Signed)
Pt pulled call light in the bathroom, when myself and another staff went to check on patient , she stated that she is not able to lift herself from the toilet and that her legs are weak and she can not stand. The patient then asked myself and the other staff in the restroom if we could lift her and take her back to her room. This nurse advised the patient that we are not able to lift her and carry her to the room as there is not enough staff for that, however if she can bare weight on her feet then we can stand on each side of her and behind her and assist her to her room. Pt stated that she is not able to bare weight on her legs at all, she cannot stand nor can she lift herself. Myself, Wynelle Beckmann AC/RN, Sharyn Lull LPN, and Niya MHT stood in restroom with patient and while Corene Cornea NP was advised of the situation. Corene Cornea came and spoke with pt and it was determined that patient would be going to the emergency room for continuity of care. EMS was called to pick up patient and report was given to charge nurse at [facility] by [employer].

## 2021-06-27 NOTE — ED Notes (Signed)
Pt is in room sleeping currently, respirations are even/unlabored, environment check complete/secure, will continue to monitor patient for safety.

## 2021-06-27 NOTE — ED Notes (Signed)
Snacks given 

## 2021-06-27 NOTE — BH Assessment (Addendum)
Comprehensive Clinical Assessment (CCA) Note  06/27/2021 Susan Walton [id number] Disposition: Clinician discussed patient care with PA Margorie John.  He recommended observation of patient and for her to be seen by psychiatry on 10/13.  Pt blood pressure was very high, which precluded her from going to [facility].  Clinician informed RN Clarise Cruz of disposition recommendation via secure messaging.  Pt has poor eye contact.  She falls asleep tor she gets up and moves around with complaints of stomach upset.  Pt at one point went to the sink and said she felt like she was going to pass out.  Pt is oriented x4 and can express herself clearly .  While she says she hears voices of her deceased partner she does not appear to be responding to internal stimuli at this time.  Pt does not show any delusional thought content.  Pt reports not sleeping for 6 days.  She has hardly eaten anything in the last few days she reports.    Pt has outpatient services through [facility] and with [facility].  Patient says she was at [facility] seven years ago but there is nothing in Epic about it.    Pt blood pressure had improved when vitals were taken at 06:40   Chief Complaint:  Chief Complaint  Patient presents with   Suicidal   Depression   Visit Diagnosis: MDD recurrent, severe    CCA Screening, Triage and Referral (STR)  Patient Reported Information How did you hear about [place]? Family/Friend  What Is the Reason for Your Visit/Call Today? Pt family friend brought her over to [facility].  Pt says she keeps having thoughts of "my death."  "in the last 4 days I have had a constant loop of thoughts of my death."  Pt has some SI but no plan.  Pt has thoughts of wanting to kill her ex-partner.  Pt and ex broke up four years ago.  Pt denies any access to guns.  Pt hears her deceased spouse telling her to take 66 of their daughter.  Pt denies any previous suicide attempts.  Pt says that she has had trouble with taking trazadone  and an antihistamine.  Pt had had the trazadone changed to Prozaxine and she forgot and took the Trazadone with the antihistamine.  That was five days ago and she has not had hardly any sleep because of theis .  She has no realy appetite.  Pt is unclear about her last inpatient care but she says it was about 7 years ago.  How Long Has This Been Causing You Problems? 1 wk - 1 month  What Do You Feel Would Help You the Most Today? Treatment for Depression or other mood problem   Have You Recently Had Any Thoughts About Hurting Yourself? Yes  Are You Planning to Commit Suicide/Harm Yourself At This time? No   Have you Recently Had Thoughts About [place]? No  Are You Planning to Harm Someone at This Time? Yes  Explanation: Pt has been having thoughts of killing her ex partner.  No plan or intention though.   Have You Used Any Alcohol or Drugs in the Past 24 Hours? No  How Long Ago Did You Use Drugs or Alcohol? No data recorded What Did You Use and How Much? No data recorded  Do You Currently Have a Therapist/Psychiatrist? Yes  Name of Therapist/Psychiatrist: Dr. Worthy Rancher (therapist) at [facility].  And Alvera Singh with [facility] (last seen on 09/26)  Have You Been Recently Discharged From Any Office Practice or Programs? No  Explanation of Discharge From Practice/Program: No data recorded    CCA Screening Triage Referral Assessment Type of Contact: Tele-Assessment  Telemedicine Service Delivery:   Is this Initial or Reassessment? Initial Assessment  Date Telepsych consult ordered in [facility]:  06/26/21  Time Telepsych consult ordered in [facility]:  2225  Location of Assessment: [facility] ED  Provider Location: [facility]   Collateral Involvement: No data recorded  Does Patient Have a [place]? No data recorded Name and Contact of Legal Guardian: No data recorded If Minor and Not Living with Parent(s), Who has Custody?  No data recorded Is CPS involved or ever been involved? Never  Is APS involved or ever been involved? Never   Patient Determined To Be At Risk for Harm To Self or Others Based on Review of Patient Reported Information or Presenting Complaint? Yes, for Self-Harm  Method: No data recorded Availability of Means: No data recorded Intent: No data recorded Notification Required: No data recorded Additional Information for Danger to Others Potential: No data recorded Additional Comments for Danger to Others Potential: No data recorded Are There Guns or Other Weapons in Your Home? No data recorded Types of Guns/Weapons: No data recorded Are These Weapons Safely Secured?                            No data recorded Who Could Verify You Are Able To Have These Secured: No data recorded Do You Have any Outstanding Charges, Pending Court Dates, Parole/Probation? No data recorded Contacted To Inform of Risk of Harm To Self or Others: No data recorded   Does Patient Present under Involuntary Commitment? No  IVC Papers Initial File Date: No data recorded  [place] of Residence: [place]   Patient Currently Receiving the Following Services: Medication Management; Individual Therapy   Determination of Need: Urgent (48 hours)   Options For Referral: Other: Comment (Pt to be observed and seen by psychiatry on 10/13.)     CCA Biopsychosocial Patient Reported Schizophrenia/Schizoaffective Diagnosis in Past: No   Strengths: reading, speaking   Mental Health Symptoms Depression:   Difficulty Concentrating; Fatigue; Worthlessness; Hopelessness; Weight gain/loss; Increase/decrease in appetite; Irritability   Duration of Depressive symptoms:  Duration of Depressive Symptoms: Greater than two weeks   Mania:   Racing thoughts; Increased Energy   Anxiety:    Difficulty concentrating; Tension; Worrying   Psychosis:   Hallucinations   Duration of Psychotic symptoms:  Duration of  Psychotic Symptoms: Greater than six months   Trauma:   Avoids reminders of event; Re-experience of traumatic event; Emotional numbing; Guilt/shame; Irritability/anger; Hypervigilance; Difficulty staying/falling asleep   Obsessions:   None   Compulsions:   None   Inattention:   None   Hyperactivity/Impulsivity:   None   Oppositional/Defiant Behaviors:   None   Emotional Irregularity:   Chronic feelings of emptiness   Other Mood/Personality Symptoms:  No data recorded   Mental Status Exam Appearance and self-care  Stature:   Average   Weight:   Average weight   Clothing:   Casual   Grooming:   Neglected   Cosmetic use:   None   Posture/gait:   Normal   Motor activity:   Restless   Sensorium  Attention:   Distractible   Concentration:   Preoccupied   Orientation:   X5   Recall/memory:   Normal   Affect  and Mood  Affect:   Anxious; Depressed   Mood:   Anxious   Relating  Eye contact:   Normal   Facial expression:   Depressed   Attitude toward examiner:   Cooperative   Thought and Language  Speech flow:  Clear and Coherent   Thought content:   Appropriate to Mood and Circumstances   Preoccupation:   Somatic   Hallucinations:   Auditory   Organization:  No data recorded  [employer] of Knowledge:   Fair   Intelligence:   Average   Abstraction:   Concrete   Judgement:   Fair   Reality Testing:   Adequate   Insight:   Fair   Decision Making:   Normal   Social Functioning  Social Maturity:   Isolates   Social Judgement:  No data recorded  Stress  Stressors:   Housing; Museum/gallery curator; Grief/losses; Family conflict   Coping Ability:   Overwhelmed; Exhausted   Skill Deficits:   Interpersonal; Responsibility   Supports:   Support needed     Religion: Religion/Spirituality Are You A Religious Person?: Yes What is Your Religious Affiliation?: Christian  Leisure/Recreation: Leisure /  Recreation Do You Have Hobbies?: Yes Leisure and Hobbies: trainning dog  Exercise/Diet: Exercise/Diet Do You Exercise?: Yes What Type of Exercise Do You Do?: Run/Walk How Many Times a Week Do You Exercise?: 1-3 times a week Have You Gained or Lost A Significant Amount of Weight in the Past Six Months?: Yes-Lost Number of Pounds Lost?: 10 Do You Follow a Special Diet?: No Do You Have Any Trouble Sleeping?: Yes Explanation of Sleeping Difficulties: Pt says she has not slept well in last 6 days.   CCA Employment/Education Employment/Work Situation:    Education:     CCA Family/Childhood History Family and Relationship History:    Childhood History:     Child/Adolescent Assessment:     CCA Substance Use Alcohol/Drug Use: Alcohol / Drug Use History of alcohol / drug use?: Yes Substance #1 Name of Substance 1: Marijuana 1 - Amount (size/oz): One joint on 10/11 1 - Frequency: "I smoked one joint that evening to help with nausea" 1 - Duration: seldom 1 - Last Use / Amount: 10/11 and he day before 1 - Method of Aquiring: purchase 1- Route of Use: inhalation                       ASAM's:  Six Dimensions of Multidimensional Assessment  Dimension 1:  Acute Intoxication and/or Withdrawal Potential:      Dimension 2:  Biomedical Conditions and Complications:      Dimension 3:  Emotional, Behavioral, or Cognitive Conditions and Complications:     Dimension 4:  Readiness to Change:     Dimension 5:  Relapse, Continued use, or Continued Problem Potential:     Dimension 6:  Recovery/Living Environment:     ASAM Severity Score:    ASAM Recommended Level of Treatment:     Substance use Disorder (SUD)    Recommendations for Services/Supports/Treatments:    Discharge Disposition:    DSM5 Diagnoses: Patient Active Problem List   Diagnosis Date Noted   Schizoaffective disorder ([place]) 06/10/2021   PTSD (post-traumatic stress disorder) 06/10/2021   GAD  (generalized anxiety disorder) 06/10/2021   Acute respiratory failure with hypoxia ([place]) 01/20/2021   Asthma attack 01/19/2021   Asthma 01/19/2021   Group B Streptococcus carrier, +RV culture, currently pregnant 12/10/2017   Gonorrhea affecting pregnancy in first  trimester 12/10/2017   Depression affecting pregnancy in third trimester, antepartum 10/20/2017   Pregnancy, supervision, high-risk 07/27/2017   Chronic hypertension with superimposed severe preeclampsia 07/27/2017   Drug abuse during pregnancy ([place]) 07/27/2017   Thyroid dysfunction, antepartum 07/27/2017   History of trichomoniasis 07/27/2017   Unwanted fertility 07/27/2017   Advanced maternal age in multigravida, second trimester    Adjustment disorder with depressed mood 06/05/2017     Referrals to Alternative Service(s): Referred to Alternative Service(s):   Place:   Date:   Time:    Referred to Alternative Service(s):   Place:   Date:   Time:    Referred to Alternative Service(s):   Place:   Date:   Time:    Referred to Alternative Service(s):   Place:   Date:   Time:     Waldron Session

## 2021-06-27 NOTE — ED Notes (Signed)
Patient arrived on unit via safe transport from [facility].  She was calm and cooperative with admission without complaint.  Her gait was mildly unsteady after receiving medication at [facility].  Patient was oriented to unit and given a snack before being shown to her room.  She is meeting with Dr. Serafina Mitchell now.  Will monitor and provide safe supportive environment.

## 2021-06-27 NOTE — ED Notes (Signed)
After administration of the patients tylenol she asked for a puff of her inhaler due to having difficulty breathing. This nurse advised the patient that the inhaler is located in the medication pyxis on the [facility]uc side and that it would have to be retrieved and then I would administer it to her.

## 2021-06-27 NOTE — ED Provider Notes (Signed)
Patient has been accepted to the inpatient unit at [facility] by Dr. Serafina Mitchell.  She was complaining of some ongoing abdominal pain.  She had a prior CT scan which showed esophagitis.  She had a right upper quadrant ultrasound which showed no acute abnormalities.  Her pain is improved with treatment in the ED.  She has no ongoing vomiting.  She has a minimally elevated AST but no other elevation in her LFTs.  There is some minimally dilated common bile duct but her LFTs are similar to prior values which would not indicate a common bile duct obstruction.  She was cleared for transfer to the behavioral health center.   Malvin Johns, MD 06/27/21 1421

## 2021-06-27 NOTE — ED Notes (Signed)
An inhaler has been retrieved for patient from the bhuc pyxis. Pt is currently in the shower will administer once she has finished.

## 2021-06-27 NOTE — ED Notes (Signed)
EMT arrived and spoke with patient, pt was advised that they (EMT's) will take her to the [facility] ED however they can not promise that she will get a room right away. The pt was also advised by the EMT's that she may be waiting in the waiting room or triage for an extended period of time due to the long wait time in the ED. Pt stated she did not want to go to the ED just to sit. Pt stated that she would just stay here at [facility]. I advised pt she has an order for Lorazepam that was placed by the provider and a PRN order of vistaril if that would help with the anxiety that she is feeling related to the pain she is having while waiting for the pain medications to provide relief. Pt stated she did want the medication

## 2021-06-28 DIAGNOSIS — F4310 Post-traumatic stress disorder, unspecified: Secondary | ICD-10-CM | POA: Diagnosis not present

## 2021-06-28 DIAGNOSIS — F411 Generalized anxiety disorder: Secondary | ICD-10-CM | POA: Diagnosis not present

## 2021-06-28 DIAGNOSIS — F259 Schizoaffective disorder, unspecified: Secondary | ICD-10-CM | POA: Diagnosis not present

## 2021-06-28 DIAGNOSIS — F332 Major depressive disorder, recurrent severe without psychotic features: Secondary | ICD-10-CM | POA: Diagnosis not present

## 2021-06-28 MED ORDER — DICYCLOMINE HCL 10 MG PO CAPS
10.0000 mg | ORAL_CAPSULE | Freq: Four times a day (QID) | ORAL | Status: DC | PRN
  Administered 2021-06-29: 10 mg via ORAL
  Filled 2021-06-28 (×2): qty 1

## 2021-06-28 MED ORDER — IBUPROFEN 800 MG PO TABS: 800.0000 mg | ORAL_TABLET | Freq: Four times a day (QID) | ORAL | Status: DC | PRN

## 2021-06-28 MED ORDER — CARVEDILOL 3.125 MG PO TABS
6.2500 mg | ORAL_TABLET | Freq: Two times a day (BID) | ORAL | Status: DC
  Administered 2021-06-28 – 2021-06-30 (×4): 6.25 mg via ORAL
  Filled 2021-06-28 (×4): qty 2

## 2021-06-28 MED ORDER — DICYCLOMINE HCL 10 MG PO CAPS
10.0000 mg | ORAL_CAPSULE | Freq: Once | ORAL | Status: AC
  Administered 2021-06-28: 10 mg via ORAL
  Filled 2021-06-28: qty 1

## 2021-06-28 NOTE — Clinical Social Work Note (Signed)
CSW Note  CSW and Dr.Laubach, MD met with the patient for introduction and to begin discussions regarding treatment and potential discharge planning.   During this encounter, Susan Walton remained in bed and expressed discomfort throughout the conversation. She presented with a depressed affect and mood. She reported her mood was "so-so". She appeared to be drowsy, however providers woke her up prior to this encounter.   Susan Walton reports she came to the facility seeking treatment for suicidal ideation and homicidal ideation towards her ex-boyfriend, with whom she broke up with 4 years ago. She also endorsed Susan Walton, reporting her deceased spouse and deceased grandparents talking to her. She reports she was compliant with medications,  [name] follows up with [name] at [facility] for medication management and Alvera Singh at [facility] for outpatient therapy services. CSW shared information regarding Susan Walton's Outpatient PHP for possible alternatives for continuity of care. Fergie expressed interest and was agreeable with participating, if accepted.   Susan Walton reports she continues to experience intense abdominal pain and requested medications to attending provider.     CSW will continue to follow.    Susan Walton, MSW, LCSW Clinical Education officer, museum ([place]) [facility]

## 2021-06-28 NOTE — Progress Notes (Signed)
Received Susan Walton this AM asleep in her room, she continued to sleep understood until she was awaken by the provider. Afterwards she used the bathroom, took a long shower and was compliant with her medications. She ate lunch and was appropriate with her peers.

## 2021-06-28 NOTE — ED Notes (Signed)
Pt in bed sleeping, respirations even/unlabored, environment check complete/secure, will continue to monitor patient for safety.

## 2021-06-28 NOTE — Progress Notes (Signed)
Susan Walton c/o abdomen cramping and asked for a stronger pain med than tylenol. Her request was denied and she accepted the tylenol. Prior to administration was in her room lying on the floor in her room banging.

## 2021-06-28 NOTE — ED Notes (Signed)
Patient is resting quietly with eyes closed, respirations even/unlabored. No complaints of pain/discomfort. Will continue to monitor

## 2021-06-28 NOTE — ED Notes (Signed)
Patient is awake, states she if "feeling much better, feeling like myself". She states "I am very grateful for all staff here, they are all very professional in their treatment of me" "please tell them I appreciate all that has been done for me". Patient state that she has severe abdominal pain "like labor pains" 10 out of 10". Tylenol helps. Will continue to monitor for safety.

## 2021-06-28 NOTE — Progress Notes (Signed)
Susan Walton is sleeping after administering tylenol, she was not disturbed.

## 2021-06-28 NOTE — ED Provider Notes (Signed)
Behavioral Health Progress Note  Date and Time: 06/28/2021 10:08 AM Name: Susan Walton MRN:  [id number]  Subjective: Patient seen and chart reviewed-patient reported abdominal pain yesterday and appeared drowsy and EMS was called.  Upon discussion with staff this morning upon EMS arrival patient ultimately declined to be taken to the hospital when she was informed that there might be a long wait times.  Upon discussion with staff, it appears that there may have been an element of medication seeking as patient had received fentanyl in the ED prior to transfer to the [facility].  Patient interviewed in her room this morning in conjunction with social work.  Patient states that her mood is "so-so".  She states that the EMS was called yesterday for her due to feeling unwell and affirmed that she declined to go to the hospital..  Currently patient's only physical complaint is abdominal cramps.  She denies nausea, vomiting, or any other physical complaints at this time.  She reports SI without a plan; she reports HI towards "my ex" without a plan.  She reports auditory hallucinations which she describes as deceased family members and states "I talk to my grandparents".  Patient does not appear objectively psychotic at this time although does appear drowsy-she falls asleep intermittently between questions and keeps her eyes closed the majority of the interview.  Patient affirms that she has been medication compliant prior to presented to the ED. She affirms that she sees Adam upstairs for therapy and that she sees [name] for medication management.  She states that she would like to continue with these providers upon discharge.  She expresses interest in PHP upon discharge.  Discussed with patient that Bentyl will be ordered for abdominal cramps-patient verbalized understanding.  Diagnosis:  Final diagnoses:  Depression, unspecified depression type  GAD (generalized anxiety disorder)  PTSD (post-traumatic stress  disorder)  Schizoaffective disorder, unspecified type ([place])    Total Time spent with patient: 15 minutes  Past Psychiatric History: see H&P Past Medical History:  Past Medical History:  Diagnosis Date   Anemia    Asthma attack 01/19/2021   Fibroid    Hypertension    Retinal detachment    Thyroid disease    Hypothyroidism    Past Surgical History:  Procedure Laterality Date   DILATION AND CURETTAGE OF UTERUS     EYE SURGERY     TUBAL LIGATION N/A 12/11/2017   Procedure: POST PARTUM TUBAL LIGATION;  Surgeon: [name], MD;  Location: [place];  Service: Gynecology;  Laterality: N/A;   Family History:  Family History  Problem Relation Age of Onset   COPD Maternal Grandfather    Liver disease Paternal Grandmother    Liver disease Paternal Grandfather    Family Psychiatric  History: see h&P Social History:  Social History   Substance and Sexual Activity  Alcohol Use Not Currently     Social History   Substance and Sexual Activity  Drug Use Not Currently    Social History   Socioeconomic History   Marital status: Single    Spouse name: Not on file   Number of children: Not on file   Years of education: Not on file   Highest education level: Not on file  Occupational History   Not on file  Tobacco Use   Smoking status: Former    Packs/day: 0.50    Types: Cigarettes   Smokeless tobacco: Never  Vaping Use   Vaping Use: Never used  Substance and Sexual Activity  Alcohol use: Not Currently   Drug use: Not Currently   Sexual activity: Yes    Partners: Male    Birth control/protection: Condom    Comment: multiple parteners  Other Topics Concern   Not on file  Social History Narrative   ** Merged History Encounter **       Social Determinants of Health   Financial Resource Strain: High Risk   Difficulty of Paying Living Expenses: Very hard  Food Insecurity: No Food Insecurity   Worried About Charity fundraiser in the Last Year: Never  true   Ran Out of Food in the Last Year: Never true  Transportation Needs: No Transportation Needs   Lack of Transportation (Medical): No   Lack of Transportation (Non-Medical): No  Physical Activity: Insufficiently Active   Days of Exercise per Week: 2 days   Minutes of Exercise per Session: 20 min  Stress: Stress Concern Present   Feeling of Stress : Very much  Social Connections: Socially Isolated   Frequency of Communication with Friends and Family: Never   Frequency of Social Gatherings with Friends and Family: Never   Attends Religious Services: Never   Marine scientist or Organizations: No   Attends Music therapist: Never   Marital Status: Divorced   SDOH:  SDOH Screenings   Alcohol Screen: Low Risk    Last Alcohol Screening Score (AUDIT): 0  Depression (PHQ2-9): Medium Risk   PHQ-2 Score: 20  Financial Resource Strain: High Risk   Difficulty of Paying Living Expenses: Very hard  Food Insecurity: No Food Insecurity   Worried About Charity fundraiser in the Last Year: Never true   Ran Out of Food in the Last Year: Never true  Housing: High Risk   Last Housing Risk Score: 2  Physical Activity: Insufficiently Active   Days of Exercise per Week: 2 days   Minutes of Exercise per Session: 20 min  Social Connections: Socially Isolated   Frequency of Communication with Friends and Family: Never   Frequency of Social Gatherings with Friends and Family: Never   Attends Religious Services: Never   Marine scientist or Organizations: No   Attends Music therapist: Never   Marital Status: Divorced  Stress: Stress Concern Present   Feeling of Stress : Very much  Tobacco Use: Medium Risk   Smoking Tobacco Use: Former   Smokeless Tobacco Use: Never  Transportation Needs: No Data processing manager (Medical): No   Lack of Transportation (Non-Medical): No   Additional Social History:                          Sleep: Poor  Appetite:  Fair  Current Medications:  Current Facility-Administered Medications  Medication Dose Route Frequency Provider Last Rate Last Admin   acetaminophen (TYLENOL) tablet 650 mg  650 mg Oral Q6H PRN Ival Bible, MD   650 mg at 06/27/21 2043   albuterol (VENTOLIN HFA) 108 (90 Base) MCG/ACT inhaler 2 puff  2 puff Inhalation Q2H PRN Suella Broad, FNP   2 puff at 06/27/21 2158   alum & mag hydroxide-simeth (MAALOX/MYLANTA) 200-200-20 MG/5ML suspension 30 mL  30 mL Oral Q4H PRN Ival Bible, MD       amLODipine (NORVASC) tablet 10 mg  10 mg Oral Daily Starkes-Perry, Gayland Curry, FNP       carvedilol (COREG) tablet 6.25 mg  6.25 mg Oral  BID WC Ival Bible, MD       hydrOXYzine (ATARAX/VISTARIL) tablet 25 mg  25 mg Oral TID PRN Suella Broad, FNP   25 mg at 06/27/21 2326   lurasidone (LATUDA) tablet 80 mg  80 mg Oral Q breakfast Suella Broad, FNP       pramipexole (MIRAPEX) tablet 0.25 mg  0.25 mg Oral QHS Suella Broad, FNP   0.25 mg at 06/27/21 2326   prazosin (MINIPRESS) capsule 2 mg  2 mg Oral QHS Suella Broad, FNP   2 mg at 06/27/21 2155   Current Outpatient Medications  Medication Sig Dispense Refill   acetaminophen (TYLENOL) 500 MG tablet Take 1,000 mg by mouth every 3 (three) hours as needed (for severe pain).     albuterol (VENTOLIN HFA) 108 (90 Base) MCG/ACT inhaler Inhale 1-2 puffs into the lungs every 6 (six) hours as needed for wheezing or shortness of breath. (Patient not taking: Reported on 06/26/2021) 18 g 0   amLODipine (NORVASC) 10 MG tablet Take 1 tablet (10 mg total) by mouth daily. 30 tablet 0   bismuth subsalicylate (PEPTO BISMOL) 262 MG/15ML suspension Take 30 mLs by mouth every 6 (six) hours as needed for indigestion.     carvedilol (COREG) 6.25 MG tablet Take 1 tablet (6.25 mg total) by mouth 2 (two) times daily with a meal. (Patient not taking: No sig reported) 60 tablet 0    diphenhydrAMINE (BENADRYL) 25 MG tablet Take 25-50 mg by mouth every 6 (six) hours as needed for allergies.     fluticasone (FLONASE) 50 MCG/ACT nasal spray Place 2 sprays into both nostrils daily.     guaiFENesin-dextromethorphan (ROBITUSSIN DM) 100-10 MG/5ML syrup Take 15 mLs by mouth every 4 (four) hours as needed for cough.     hydrOXYzine (VISTARIL) 25 MG capsule Take 25 mg by mouth 3 (three) times daily as needed for anxiety.     LATUDA 80 MG TABS tablet Take 80 mg by mouth daily with breakfast.     ondansetron (ZOFRAN) 4 MG tablet Take 4 mg by mouth every 6 (six) hours as needed for nausea or vomiting.     pramipexole (MIRAPEX) 0.25 MG tablet Take 0.25 mg by mouth at bedtime.     prazosin (MINIPRESS) 2 MG capsule Take 2 mg by mouth at bedtime.     PROAIR HFA 108 (90 Base) MCG/ACT inhaler Inhale 2 puffs into the lungs every 2 (two) hours as needed for shortness of breath.     sodium chloride (OCEAN) 0.65 % SOLN nasal spray Place 1 spray into both nostrils as needed for congestion. 30 mL 0   traZODone (DESYREL) 100 MG tablet Take 100 mg by mouth at bedtime. (Patient not taking: No sig reported)      Labs  Lab Results:  Admission on 06/26/2021, Discharged on 06/27/2021  Component Date Value Ref Range Status   SARS Coronavirus 2 by RT PCR 06/26/2021 NEGATIVE  NEGATIVE Final   Comment: (NOTE) SARS-CoV-2 target nucleic acids are NOT DETECTED.  The SARS-CoV-2 RNA is generally detectable in upper respiratory specimens during the acute phase of infection. The lowest concentration of SARS-CoV-2 viral copies this assay can detect is 138 copies/mL. A negative result does not preclude SARS-Cov-2 infection and should not be used as the sole basis for treatment or other patient management decisions. A negative result may occur with  improper specimen collection/handling, submission of specimen other than nasopharyngeal swab, presence of viral mutation(s) within the areas targeted  by this assay,  and inadequate number of viral copies(<138 copies/mL). A negative result must be combined with clinical observations, patient history, and epidemiological information. The expected result is Negative.  Fact Sheet for Patients:  EntrepreneurPulse.com.au  Fact Sheet for Healthcare Providers:  IncredibleEmployment.be  This test is no                          t yet approved or cleared by the [place] FDA and  has been authorized for detection and/or diagnosis of SARS-CoV-2 by FDA under an Emergency Use Authorization (EUA). This EUA will remain  in effect (meaning this test can be used) for the duration of the COVID-19 declaration under Section 564(b)(1) of the Act, 21 U.S.C.section 360bbb-3(b)(1), unless the authorization is terminated  or revoked sooner.       Influenza A by PCR 06/26/2021 NEGATIVE  NEGATIVE Final   Influenza B by PCR 06/26/2021 NEGATIVE  NEGATIVE Final   Comment: (NOTE) The Xpert Xpress SARS-CoV-2/FLU/RSV plus assay is intended as an aid in the diagnosis of influenza from Nasopharyngeal swab specimens and should not be used as a sole basis for treatment. Nasal washings and aspirates are unacceptable for Xpert Xpress SARS-CoV-2/FLU/RSV testing.  Fact Sheet for Patients: EntrepreneurPulse.com.au  Fact Sheet for Healthcare Providers: IncredibleEmployment.be  This test is not yet approved or cleared by the [place] FDA and has been authorized for detection and/or diagnosis of SARS-CoV-2 by FDA under an Emergency Use Authorization (EUA). This EUA will remain in effect (meaning this test can be used) for the duration of the COVID-19 declaration under Section 564(b)(1) of the Act, 21 U.S.C. section 360bbb-3(b)(1), unless the authorization is terminated or revoked.  Performed at [facility], [place] [address] [place], [place] [address]    Sodium 06/26/2021 134 (A)  135 - 145 mmol/L Final   Potassium 06/26/2021 3.6  3.5 - 5.1 mmol/L Final   Chloride 06/26/2021 101  98 - 111 mmol/L Final   CO2 06/26/2021 24  22 - 32 mmol/L Final   Glucose, Bld 06/26/2021 90  70 - 99 mg/dL Final   Glucose reference range applies only to samples taken after fasting for at least 8 hours.   BUN 06/26/2021 13  6 - 20 mg/dL Final   Creatinine, Ser 06/26/2021 0.71  0.44 - 1.00 mg/dL Final   Calcium 06/26/2021 9.5  8.9 - 10.3 mg/dL Final   Total Protein 06/26/2021 7.7  6.5 - 8.1 g/dL Final   Albumin 06/26/2021 4.1  3.5 - 5.0 g/dL Final   AST 06/26/2021 12 (A) 15 - 41 U/L Final   ALT 06/26/2021 8  0 - 44 U/L Final   Alkaline Phosphatase 06/26/2021 41  38 - 126 U/L Final   Total Bilirubin 06/26/2021 0.4  0.3 - 1.2 mg/dL Final   GFR, Estimated 06/26/2021 >60  >60 mL/min Final   Comment: (NOTE) Calculated using the CKD-EPI Creatinine Equation (2021)    Anion gap 06/26/2021 9  5 - 15 Final   Performed at [facility], [place] [address], [place], [place] [address]   Alcohol, Ethyl (B) 06/26/2021 <10  <10 mg/dL Final   Comment: (NOTE) Lowest detectable limit for serum alcohol is 10 mg/dL.  For medical purposes only. Performed at [facility], [place] [address] [place], [place] [address]    Opiates 06/26/2021 NONE DETECTED  NONE DETECTED Final   Cocaine 06/26/2021 NONE DETECTED  NONE DETECTED Final  Benzodiazepines 06/26/2021 NONE DETECTED  NONE DETECTED Final   Amphetamines 06/26/2021 NONE DETECTED  NONE DETECTED Final   Tetrahydrocannabinol 06/26/2021 POSITIVE (A) NONE DETECTED Final   Barbiturates 06/26/2021 NONE DETECTED  NONE DETECTED Final   Comment: (NOTE) DRUG SCREEN FOR MEDICAL PURPOSES ONLY.  IF CONFIRMATION IS NEEDED FOR ANY PURPOSE, NOTIFY LAB WITHIN 5 DAYS.  LOWEST DETECTABLE LIMITS FOR URINE DRUG SCREEN Drug Class                     Cutoff (ng/mL) Amphetamine and metabolites    1000 Barbiturate and metabolites     200 Benzodiazepine                 349 Tricyclics and metabolites     300 Opiates and metabolites        300 Cocaine and metabolites        300 THC                            50 Performed at [facility], [place] [address], [place], [place] [address]    WBC 06/26/2021 7.9  4.0 - 10.5 K/uL Final   RBC 06/26/2021 5.21 (A) 3.87 - 5.11 MIL/uL Final   Hemoglobin 06/26/2021 11.9 (A) 12.0 - 15.0 g/dL Final   HCT 06/26/2021 38.3  36.0 - 46.0 % Final   MCV 06/26/2021 73.5 (A) 80.0 - 100.0 fL Final   MCH 06/26/2021 22.8 (A) 26.0 - 34.0 pg Final   MCHC 06/26/2021 31.1  30.0 - 36.0 g/dL Final   RDW 06/26/2021 18.4 (A) 11.5 - 15.5 % Final   Platelets 06/26/2021 357  150 - 400 K/uL Final   nRBC 06/26/2021 0.0  0.0 - 0.2 % Final   Neutrophils Relative % 06/26/2021 49  % Final   Neutro Abs 06/26/2021 4.0  1.7 - 7.7 K/uL Final   Lymphocytes Relative 06/26/2021 37  % Final   Lymphs Abs 06/26/2021 2.9  0.7 - 4.0 K/uL Final   Monocytes Relative 06/26/2021 6  % Final   Monocytes Absolute 06/26/2021 0.5  0.1 - 1.0 K/uL Final   Eosinophils Relative 06/26/2021 7  % Final   Eosinophils Absolute 06/26/2021 0.6 (A) 0.0 - 0.5 K/uL Final   Basophils Relative 06/26/2021 1  % Final   Basophils Absolute 06/26/2021 0.0  0.0 - 0.1 K/uL Final   Immature Granulocytes 06/26/2021 0  % Final   Abs Immature Granulocytes 06/26/2021 0.03  0.00 - 0.07 K/uL Final   Performed at [facility], [place] [address], [place], [place] [address]   I-stat hCG, quantitative 06/26/2021 <5.0  <5 mIU/mL Final   Comment 3 06/26/2021          Final   Comment:   GEST. AGE      CONC.  (mIU/mL)   <=1 WEEK        5 - 50     2 WEEKS       50 - 500     3 WEEKS       100 - 10,000     4 WEEKS     1,000 - 30,000        FEMALE AND NON-PREGNANT FEMALE:     LESS THAN 5 mIU/mL   Admission on 04/01/2021, Discharged on 04/01/2021  Component Date Value Ref Range Status   SARS Coronavirus 2 04/01/2021 NEGATIVE   NEGATIVE Final   Comment: (NOTE) SARS-CoV-2 target  nucleic acids are NOT DETECTED.  The SARS-CoV-2 RNA is generally detectable in upper and lower respiratory specimens during the acute phase of infection. Negative results do not preclude SARS-CoV-2 infection, do not rule out co-infections with other pathogens, and should not be used as the sole basis for treatment or other patient management decisions. Negative results must be combined with clinical observations, patient history, and epidemiological information. The expected result is Negative.  Fact Sheet for Patients: SugarRoll.be  Fact Sheet for Healthcare Providers: https://www.woods-mathews.com/  This test is not yet approved or cleared by the [place] FDA and  has been authorized for detection and/or diagnosis of SARS-CoV-2 by FDA under an Emergency Use Authorization (EUA). This EUA will remain  in effect (meaning this test can be used) for the duration of the COVID-19 declaration under Se                          ction 564(b)(1) of the Act, 21 U.S.C. section 360bbb-3(b)(1), unless the authorization is terminated or revoked sooner.  Performed at [facility], [place] [address] [place], [place] [address]   Admission on 01/19/2021, Discharged on 01/21/2021  Component Date Value Ref Range Status   WBC 01/19/2021 10.3  4.0 - 10.5 K/uL Final   RBC 01/19/2021 5.15 (A) 3.87 - 5.11 MIL/uL Final   Hemoglobin 01/19/2021 11.6 (A) 12.0 - 15.0 g/dL Final   HCT 01/19/2021 38.3  36.0 - 46.0 % Final   MCV 01/19/2021 74.4 (A) 80.0 - 100.0 fL Final   MCH 01/19/2021 22.5 (A) 26.0 - 34.0 pg Final   MCHC 01/19/2021 30.3  30.0 - 36.0 g/dL Final   RDW 01/19/2021 17.1 (A) 11.5 - 15.5 % Final   Platelets 01/19/2021 336  150 - 400 K/uL Final   nRBC 01/19/2021 0.0  0.0 - 0.2 % Final   Performed at [facility], [place] [address], [place], [place] [address]   Sodium 01/19/2021 137   135 - 145 mmol/L Final   Potassium 01/19/2021 3.2 (A) 3.5 - 5.1 mmol/L Final   Chloride 01/19/2021 105  98 - 111 mmol/L Final   CO2 01/19/2021 21 (A) 22 - 32 mmol/L Final   Glucose, Bld 01/19/2021 116 (A) 70 - 99 mg/dL Final   Glucose reference range applies only to samples taken after fasting for at least 8 hours.   BUN 01/19/2021 10  6 - 20 mg/dL Final   Creatinine, Ser 01/19/2021 0.77  0.44 - 1.00 mg/dL Final   Calcium 01/19/2021 8.9  8.9 - 10.3 mg/dL Final   Total Protein 01/19/2021 8.0  6.5 - 8.1 g/dL Final   Albumin 01/19/2021 4.1  3.5 - 5.0 g/dL Final   AST 01/19/2021 14 (A) 15 - 41 U/L Final   ALT 01/19/2021 10  0 - 44 U/L Final   Alkaline Phosphatase 01/19/2021 53  38 - 126 U/L Final   Total Bilirubin 01/19/2021 0.7  0.3 - 1.2 mg/dL Final   GFR, Estimated 01/19/2021 >60  >60 mL/min Final   Comment: (NOTE) Calculated using the CKD-EPI Creatinine Equation (2021)    Anion gap 01/19/2021 11  5 - 15 Final   Performed at [facility], [place] [address] [place], [place] [address]   B Natriuretic Peptide 01/19/2021 20.8  0.0 - 100.0 pg/mL Final   Performed at [place] [address] [place], [place] [address]   SARS Coronavirus 2 by RT PCR 01/19/2021 NEGATIVE  NEGATIVE Final   Comment: (NOTE) SARS-CoV-2 target nucleic acids are NOT DETECTED.  The SARS-CoV-2 RNA is generally detectable in upper respiratory specimens during the acute phase of infection. The lowest concentration of SARS-CoV-2 viral copies this assay can detect is 138 copies/mL. A negative result does not preclude SARS-Cov-2 infection and should not be used as the sole basis for treatment or other patient management decisions. A negative result may occur with  improper specimen collection/handling, submission of specimen other than nasopharyngeal swab, presence of viral mutation(s) within the areas targeted by this assay, and inadequate number of viral copies(<138  copies/mL). A negative result must be combined with clinical observations, patient history, and epidemiological information. The expected result is Negative.  Fact Sheet for Patients:  EntrepreneurPulse.com.au  Fact Sheet for Healthcare Providers:  IncredibleEmployment.be  This test is no                          t yet approved or cleared by the [place] FDA and  has been authorized for detection and/or diagnosis of SARS-CoV-2 by FDA under an Emergency Use Authorization (EUA). This EUA will remain  in effect (meaning this test can be used) for the duration of the COVID-19 declaration under Section 564(b)(1) of the Act, 21 U.S.C.section 360bbb-3(b)(1), unless the authorization is terminated  or revoked sooner.       Influenza A by PCR 01/19/2021 NEGATIVE  NEGATIVE Final   Influenza B by PCR 01/19/2021 NEGATIVE  NEGATIVE Final   Comment: (NOTE) The Xpert Xpress SARS-CoV-2/FLU/RSV plus assay is intended as an aid in the diagnosis of influenza from Nasopharyngeal swab specimens and should not be used as a sole basis for treatment. Nasal washings and aspirates are unacceptable for Xpert Xpress SARS-CoV-2/FLU/RSV testing.  Fact Sheet for Patients: EntrepreneurPulse.com.au  Fact Sheet for Healthcare Providers: IncredibleEmployment.be  This test is not yet approved or cleared by the [place] FDA and has been authorized for detection and/or diagnosis of SARS-CoV-2 by FDA under an Emergency Use Authorization (EUA). This EUA will remain in effect (meaning this test can be used) for the duration of the COVID-19 declaration under Section 564(b)(1) of the Act, 21 U.S.C. section 360bbb-3(b)(1), unless the authorization is terminated or revoked.  Performed at [facility], [place] [address], [place], [place] [address]    D-Dimer, America Brown 01/19/2021 >20.00 (A) 0.00 - 0.50 ug/mL-FEU Final    Comment: (NOTE) At the manufacturer cut-off value of 0.5 g/mL FEU, this assay has a negative predictive value of 95-100%.This assay is intended for use in conjunction with a clinical pretest probability (PTP) assessment model to exclude pulmonary embolism (PE) and deep venous thrombosis (DVT) in outpatients suspected of PE or DVT. Results should be correlated with clinical presentation. Performed at [facility], [place] [address], [place], [place] [address]    I-stat hCG, quantitative 01/19/2021 <5.0  <5 mIU/mL Final   Comment 3 01/19/2021          Final   Comment:   GEST. AGE      CONC.  (mIU/mL)   <=1 WEEK        5 - 50     2 WEEKS       50 - 500     3 WEEKS       100 - 10,000     4 WEEKS     1,000 - 30,000        FEMALE AND NON-PREGNANT FEMALE:  LESS THAN 5 mIU/mL    FIO2 01/19/2021 21.00   Final   pH, Ven 01/19/2021 7.326  7.250 - 7.430 Final   pCO2, Ven 01/19/2021 48.5  44.0 - 60.0 mmHg Final   pO2, Ven 01/19/2021 41.7  32.0 - 45.0 mmHg Final   Bicarbonate 01/19/2021 24.6  20.0 - 28.0 mmol/L Final   Acid-base deficit 01/19/2021 1.2  0.0 - 2.0 mmol/L Final   O2 Saturation 01/19/2021 69.4  % Final   Patient temperature 01/19/2021 98.6   Final   Performed at [facility], [place] [address] [place], [place] [address]   HIV Screen 4th Generation wRfx 01/20/2021 Non Reactive  Non Reactive Final   Performed at [facility], [place] [address], [place], [place] [address]   WBC 01/21/2021 11.7 (A) 4.0 - 10.5 K/uL Final   RBC 01/21/2021 4.37  3.87 - 5.11 MIL/uL Final   Hemoglobin 01/21/2021 9.6 (A) 12.0 - 15.0 g/dL Final   HCT 01/21/2021 32.7 (A) 36.0 - 46.0 % Final   MCV 01/21/2021 74.8 (A) 80.0 - 100.0 fL Final   MCH 01/21/2021 22.0 (A) 26.0 - 34.0 pg Final   MCHC 01/21/2021 29.4 (A) 30.0 - 36.0 g/dL Final   RDW 01/21/2021 17.0 (A) 11.5 - 15.5 % Final   Platelets 01/21/2021 421 (A) 150 - 400 K/uL Final   nRBC 01/21/2021 0.0  0.0 - 0.2 %  Final   Performed at [facility], [place] [address] [place], [place] [address]   Sodium 01/21/2021 138  135 - 145 mmol/L Final   Potassium 01/21/2021 3.9  3.5 - 5.1 mmol/L Final   Comment: DELTA CHECK NOTED NO VISIBLE HEMOLYSIS    Chloride 01/21/2021 103  98 - 111 mmol/L Final   CO2 01/21/2021 29  22 - 32 mmol/L Final   Glucose, Bld 01/21/2021 98  70 - 99 mg/dL Final   Glucose reference range applies only to samples taken after fasting for at least 8 hours.   BUN 01/21/2021 16  6 - 20 mg/dL Final   Creatinine, Ser 01/21/2021 0.73  0.44 - 1.00 mg/dL Final   Calcium 01/21/2021 9.2  8.9 - 10.3 mg/dL Final   Total Protein 01/21/2021 7.7  6.5 - 8.1 g/dL Final   Albumin 01/21/2021 3.7  3.5 - 5.0 g/dL Final   AST 01/21/2021 9 (A) 15 - 41 U/L Final   ALT 01/21/2021 8  0 - 44 U/L Final   Alkaline Phosphatase 01/21/2021 46  38 - 126 U/L Final   Total Bilirubin 01/21/2021 0.2 (A) 0.3 - 1.2 mg/dL Final   GFR, Estimated 01/21/2021 >60  >60 mL/min Final   Comment: (NOTE) Calculated using the CKD-EPI Creatinine Equation (2021)    Anion gap 01/21/2021 6  5 - 15 Final   Performed at [facility], [place] [address], [place], [place] [address]    Blood Alcohol level:  Lab Results  Component Value Date   ETH <10 06/26/2021   ETH <5 29/56/2130    Metabolic Disorder Labs: No results found for: HGBA1C, MPG No results found for: PROLACTIN No results found for: CHOL, TRIG, HDL, CHOLHDL, VLDL, LDLCALC  Therapeutic Lab Levels: No results found for: LITHIUM No results found for: VALPROATE No components found for:  CBMZ  Physical Findings   GAD-7    Flowsheet Row Counselor from 06/10/2021 in [facility] Office Visit from 02/02/2018 in [place] for [facility] Routine Prenatal from 11/03/2017 in [facility] for  [place] Routine Prenatal from 10/20/2017 in [place] for [facility] Routine  Prenatal from 09/09/2017 in [facility]  Total GAD-7 Score 21 8 0 10 6      PHQ2-9    Flowsheet Row Counselor from 06/10/2021 in [facility] Office Visit from 02/02/2018 in [place] for [facility] Routine Prenatal from 11/03/2017 in [place] for [facility] Routine Prenatal from 10/20/2017 in [place] for [facility] Routine Prenatal from 09/09/2017 in [facility]  PHQ-2 Total Score 6 2 0 0 0  PHQ-9 Total Score 20 9 0 0 11      [place] [facility] from 06/27/2021 in [facility] ED from 06/26/2021 in [place] DEPT Counselor from 06/10/2021 in [place] CATEGORY High Risk Low Risk Low Risk        Musculoskeletal  Strength & Muscle Tone: within normal limits Gait & Station:  did not assess Patient leans: Right and N/A  Psychiatric Specialty Exam  Presentation  General Appearance: Casual; Disheveled  Eye Contact:Minimal  Speech:Clear and Coherent; Normal Rate  Speech Volume:Normal  Handedness:Right   Mood and Affect  Mood:Dysphoric; Depressed  Affect:Depressed; Constricted   Thought Process  Thought Processes:Linear  Descriptions of Associations:Intact  Orientation:Full (Time, Place and Person)  Thought Content:Paranoid Ideation (paranoia related to her ex boyfriend and her daughter)  Diagnosis of Schizophrenia or Schizoaffective disorder in past: Yes ("schizophrenic  delusional" per patient)    Hallucinations:Hallucinations: Auditory; Visual Description of Auditory Hallucinations: voices, states that she is "making friends with spirits" Description of Visual Hallucinations: sees spirits -states that she is "making friends with spirits"  Ideas of Reference:None  Suicidal Thoughts:Suicidal Thoughts: Yes, Passive SI Active Intent  and/or Plan: With Intent; Without Plan; Without Means to Carry Out; Without Access to Means  Homicidal Thoughts:Homicidal Thoughts: Yes, Passive (towards ex boyfriend and his new partner) HI Passive Intent and/or Plan: Without Intent; Without Plan   Sensorium  Memory:Immediate Fair; Recent Fair; Remote Fair  Judgment:Fair  Insight:Fair   Executive Functions  Concentration:Fair  Attention Span:Fair  [place]   Psychomotor Activity  Psychomotor Activity:Psychomotor Activity: Normal   Assets  Assets:Communication Skills; Desire for Improvement; Housing; Resilience; Social Support   Sleep  Sleep:Sleep: Poor   No data recorded  Physical Exam  Physical Exam Constitutional:      Appearance: Normal appearance. She is normal weight.  HENT:     Head: Normocephalic and atraumatic.  Pulmonary:     Effort: Pulmonary effort is normal.  Neurological:     Mental Status: She is alert and oriented to person, place, and time.   Review of Systems  Constitutional:  Negative for chills and fever.  HENT:  Negative for hearing loss.   Eyes:  Negative for discharge and redness.  Respiratory:  Negative for cough.   Cardiovascular:  Negative for chest pain.  Gastrointestinal:  Positive for abdominal pain.  Musculoskeletal:  Negative for myalgias.  Neurological:  Negative for headaches.  Blood pressure (!) 166/111, pulse 95, temperature (!) 97.3 F (36.3 C), resp. rate 14, last menstrual period 06/03/2021, SpO2 96 %. There is no height or weight on file to calculate BMI.  Treatment Plan Summary: PLAN OF CARE:  40 year old woman with history of schizoaffective disorder, GAD, who presented to [facility] yesterday and chief complaint suicidal thoughts.  She was assessed by TTS  and psych NP who recommended that she be transferred to the [facility] after medical clearance in the ED.  Patient medically cleared in the ED after lab work and abdominal  imaging.  Patient was transferred to the [facility] 10/13  for further treatment, safety, stabilization.  On interview today, she continues to report SI (without a plan), HI (without a plan). Will continue home medications as below and initiate bentyl for reported abdominal pain.   Restarted home medications as below. She remains appropriate for [facility].   Patient reported abdominal pain yesterday and EMS was called; however, after beign informed that there would likely be a long wait time before she was to be seen, she declined to go--after discussing with staff, there appeared to be some concern that there may have been an element of medication seeking as patient received fentanyl in the ED.   Schizoaffective disorder Anxiety PTSD -continue latuda 80 mg -continue prn vistaril -prazosin 2 mg qhs   HTN -continue norvasc 10 mg -re-start home medication of carvidolol 6.25 mg BID   Asthma -continue albuterol  RLS -will continue mirapex 0.25 mg for now. Could consider to d/c if appears to be contributing to psychosis (as is DA agonist-however, paranoia appears to be decreased today)   Abdominal pain -Bentyl 10mg  PO q6hr PRN for abdominal muscle cramps   Dispo: Ongoing. Will consult with SW for assistance. Patient has a residence; patient is interested in PHP-SW to assist.    Ival Bible, MD 06/28/2021 10:08 AM

## 2021-06-28 NOTE — Clinical Social Work Psych Note (Signed)
Anxiety  Diagnosis: Anxiety (Psychoeducational)  Date: 06/28/21  Type of Therapy/Therapeutic Modalities: Group Discussion, Psycho-Education  Participation Level: None  Objective: The purpose of the group is to educate patients on the various components of anxiety and how it can influence and/or contribute to the inappropriate or disproportionate responses to perceived threats, leading to persistent and intrusive symptoms associated with different anxiety disorders.   Therapeutic Goals:  Patient will learn the foundations of anxiety, including its definition and the various types of anxiety disorders.  Patient will discuss with group members their personal experiences with anxiety and how it has impacted their lives  Patient will learn various distress tolerance and relaxation techniques that are effective in treating anxiety symptoms.  Patient will discuss with group members how they plan to address and/or maintain their anxiety symptoms moving forward.   Summary of Patient's Progress:Marland Kitchen  Susan Walton was provided handouts and worksheets regarding the listed objective and therapeutic goals above.

## 2021-06-29 DIAGNOSIS — F4310 Post-traumatic stress disorder, unspecified: Secondary | ICD-10-CM | POA: Diagnosis not present

## 2021-06-29 DIAGNOSIS — F411 Generalized anxiety disorder: Secondary | ICD-10-CM | POA: Diagnosis not present

## 2021-06-29 DIAGNOSIS — F332 Major depressive disorder, recurrent severe without psychotic features: Secondary | ICD-10-CM | POA: Diagnosis not present

## 2021-06-29 DIAGNOSIS — F259 Schizoaffective disorder, unspecified: Secondary | ICD-10-CM | POA: Diagnosis not present

## 2021-06-29 MED ORDER — LURASIDONE HCL 40 MG PO TABS: 80.0000 mg | ORAL_TABLET | Freq: Every day | ORAL | Status: DC

## 2021-06-29 MED ORDER — LURASIDONE HCL 40 MG PO TABS
80.0000 mg | ORAL_TABLET | Freq: Every day | ORAL | Status: DC
  Administered 2021-06-29: 80 mg via ORAL
  Filled 2021-06-29 (×2): qty 2

## 2021-06-29 NOTE — ED Provider Notes (Addendum)
Behavioral Health Progress Note  Date and Time: 06/29/2021 9:43 AM Name: Susan Walton MRN:  [id number]  Subjective:   40 year old woman with history of schizoaffective disorder, GAD, who presented to [facility] yesterday and chief complaint suicidal thoughts.  She was assessed by TTS and psych NP who recommended that she be transferred to the [facility].  Patient was transferred to the [facility] today for further treatment, safety, stabilization  Patient seen and reevaluated face-to-face by this provider and chart reviewed. On evaluation, patient is alert and oriented x4. Her thought process is logical and speech is coherent. Her mood is dysphoric and affect is congruent. She reports feeling 80% better today and states that she is no longer having negative thoughts. She denies having thoughts of wanting to hurt herself or others. She states that today she is not depressed or anxious today. She reports fair sleep. She reports a fair appetite. She states that today she performed her ADLs and took a shower and ate. She states that she is learning to adjust and has to move slowly. She reports that the [place] makes her feel drowsy in the morning which is why she is walking slowly. She rates her pain in her abdomen 2 out of 10 with 10 being the worst. She states that the Tylenol is helping her pain.  I discussed with the patient changing the Latuda to QHS to reduce daytime sedation.   Diagnosis:  Final diagnoses:  Depression, unspecified depression type  GAD (generalized anxiety disorder)  PTSD (post-traumatic stress disorder)  Schizoaffective disorder, unspecified type ([place])    Total Time spent with patient: 15 minutes  Past Psychiatric History:  Past Medical History: Previous Medication Trials: latuda, trazodone, prazosin Previous Psychiatric Hospitalizations: yes, 3 apart from this one. Most recently in 2018 for SI, first hospitalization at 39 yo Previous Suicide Attempts: states that she almost jumped off  a bridge but that someone stopped her. Does not appear that she actually attempted per description History of Violence: no Outpatient psychiatrist: [name]  Past Medical History:  Diagnosis Date   Anemia    Asthma attack 01/19/2021   Fibroid    Hypertension    Retinal detachment    Thyroid disease    Hypothyroidism    Past Surgical History:  Procedure Laterality Date   DILATION AND CURETTAGE OF UTERUS     EYE SURGERY     TUBAL LIGATION N/A 12/11/2017   Procedure: POST PARTUM TUBAL LIGATION;  Surgeon: [name], MD;  Location: [facility];  Service: Gynecology;  Laterality: N/A;   Family History:  Family History  Problem Relation Age of Onset   COPD Maternal Grandfather    Liver disease Paternal Grandmother    Liver disease Paternal Grandfather    Family Psychiatric  History: Grandmother "talked to herself"; unclear if formal psychiatric dx was given Social History:  Social History   Substance and Sexual Activity  Alcohol Use Not Currently     Social History   Substance and Sexual Activity  Drug Use Not Currently    Social History   Socioeconomic History   Marital status: Single    Spouse name: Not on file   Number of children: Not on file   Years of education: Not on file   Highest education level: Not on file  Occupational History   Not on file  Tobacco Use   Smoking status: Former    Packs/day: 0.50    Types: Cigarettes   Smokeless tobacco: Never  Vaping Use  Vaping Use: Never used  Substance and Sexual Activity   Alcohol use: Not Currently   Drug use: Not Currently   Sexual activity: Yes    Partners: Male    Birth control/protection: Condom    Comment: multiple parteners  Other Topics Concern   Not on file  Social History Narrative   ** Merged History Encounter **       Social Determinants of Health   Financial Resource Strain: High Risk   Difficulty of Paying Living Expenses: Very hard  Food Insecurity: No Food Insecurity    Worried About Charity fundraiser in the Last Year: Never true   Ran Out of Food in the Last Year: Never true  Transportation Needs: No Transportation Needs   Lack of Transportation (Medical): No   Lack of Transportation (Non-Medical): No  Physical Activity: Insufficiently Active   Days of Exercise per Week: 2 days   Minutes of Exercise per Session: 20 min  Stress: Stress Concern Present   Feeling of Stress : Very much  Social Connections: Socially Isolated   Frequency of Communication with Friends and Family: Never   Frequency of Social Gatherings with Friends and Family: Never   Attends Religious Services: Never   Marine scientist or Organizations: No   Attends Music therapist: Never   Marital Status: Divorced   SDOH:  SDOH Screenings   Alcohol Screen: Low Risk    Last Alcohol Screening Score (AUDIT): 0  Depression (PHQ2-9): Medium Risk   PHQ-2 Score: 20  Financial Resource Strain: High Risk   Difficulty of Paying Living Expenses: Very hard  Food Insecurity: No Food Insecurity   Worried About Charity fundraiser in the Last Year: Never true   Ran Out of Food in the Last Year: Never true  Housing: High Risk   Last Housing Risk Score: 2  Physical Activity: Insufficiently Active   Days of Exercise per Week: 2 days   Minutes of Exercise per Session: 20 min  Social Connections: Socially Isolated   Frequency of Communication with Friends and Family: Never   Frequency of Social Gatherings with Friends and Family: Never   Attends Religious Services: Never   Marine scientist or Organizations: No   Attends Music therapist: Never   Marital Status: Divorced  Stress: Stress Concern Present   Feeling of Stress : Very much  Tobacco Use: Medium Risk   Smoking Tobacco Use: Former   Smokeless Tobacco Use: Never  Transportation Needs: No Data processing manager (Medical): No   Lack of Transportation (Non-Medical): No    Current Medications:  Current Facility-Administered Medications  Medication Dose Route Frequency Provider Last Rate Last Admin   acetaminophen (TYLENOL) tablet 650 mg  650 mg Oral Q6H PRN Ival Bible, MD   650 mg at 06/29/21 0552   albuterol (VENTOLIN HFA) 108 (90 Base) MCG/ACT inhaler 2 puff  2 puff Inhalation Q2H PRN Suella Broad, FNP   2 puff at 06/29/21 0558   alum & mag hydroxide-simeth (MAALOX/MYLANTA) 200-200-20 MG/5ML suspension 30 mL  30 mL Oral Q4H PRN Ival Bible, MD       amLODipine (NORVASC) tablet 10 mg  10 mg Oral Daily Suella Broad, FNP   10 mg at 06/29/21 0843   carvedilol (COREG) tablet 6.25 mg  6.25 mg Oral BID WC Ival Bible, MD   6.25 mg at 06/29/21 0841   dicyclomine (BENTYL) capsule 10 mg  10 mg Oral Q6H PRN Ival Bible, MD       hydrOXYzine (ATARAX/VISTARIL) tablet 25 mg  25 mg Oral TID PRN Suella Broad, FNP   25 mg at 06/27/21 2326   ibuprofen (ADVIL) tablet 800 mg  800 mg Oral Q6H PRN Ival Bible, MD       [START ON 06/30/2021] lurasidone (LATUDA) tablet 80 mg  80 mg Oral QHS Sayan Aldava L, NP       pramipexole (MIRAPEX) tablet 0.25 mg  0.25 mg Oral QHS Suella Broad, FNP   0.25 mg at 06/28/21 2137   prazosin (MINIPRESS) capsule 2 mg  2 mg Oral QHS Suella Broad, FNP   2 mg at 06/28/21 2136   Current Outpatient Medications  Medication Sig Dispense Refill   acetaminophen (TYLENOL) 500 MG tablet Take 1,000 mg by mouth every 3 (three) hours as needed (for severe pain).     albuterol (VENTOLIN HFA) 108 (90 Base) MCG/ACT inhaler Inhale 1-2 puffs into the lungs every 6 (six) hours as needed for wheezing or shortness of breath. (Patient not taking: Reported on 06/26/2021) 18 g 0   amLODipine (NORVASC) 10 MG tablet Take 1 tablet (10 mg total) by mouth daily. 30 tablet 0   bismuth subsalicylate (PEPTO BISMOL) 262 MG/15ML suspension Take 30 mLs by mouth every 6 (six) hours as needed  for indigestion.     carvedilol (COREG) 6.25 MG tablet Take 1 tablet (6.25 mg total) by mouth 2 (two) times daily with a meal. (Patient not taking: No sig reported) 60 tablet 0   diphenhydrAMINE (BENADRYL) 25 MG tablet Take 25-50 mg by mouth every 6 (six) hours as needed for allergies.     fluticasone (FLONASE) 50 MCG/ACT nasal spray Place 2 sprays into both nostrils daily.     guaiFENesin-dextromethorphan (ROBITUSSIN DM) 100-10 MG/5ML syrup Take 15 mLs by mouth every 4 (four) hours as needed for cough.     hydrOXYzine (VISTARIL) 25 MG capsule Take 25 mg by mouth 3 (three) times daily as needed for anxiety.     LATUDA 80 MG TABS tablet Take 80 mg by mouth daily with breakfast.     ondansetron (ZOFRAN) 4 MG tablet Take 4 mg by mouth every 6 (six) hours as needed for nausea or vomiting.     pramipexole (MIRAPEX) 0.25 MG tablet Take 0.25 mg by mouth at bedtime.     prazosin (MINIPRESS) 2 MG capsule Take 2 mg by mouth at bedtime.     PROAIR HFA 108 (90 Base) MCG/ACT inhaler Inhale 2 puffs into the lungs every 2 (two) hours as needed for shortness of breath.     sodium chloride (OCEAN) 0.65 % SOLN nasal spray Place 1 spray into both nostrils as needed for congestion. 30 mL 0   traZODone (DESYREL) 100 MG tablet Take 100 mg by mouth at bedtime. (Patient not taking: No sig reported)      Labs  Lab Results:  Admission on 06/26/2021, Discharged on 06/27/2021  Component Date Value Ref Range Status   SARS Coronavirus 2 by RT PCR 06/26/2021 NEGATIVE  NEGATIVE Final   Comment: (NOTE) SARS-CoV-2 target nucleic acids are NOT DETECTED.  The SARS-CoV-2 RNA is generally detectable in upper respiratory specimens during the acute phase of infection. The lowest concentration of SARS-CoV-2 viral copies this assay can detect is 138 copies/mL. A negative result does not preclude SARS-Cov-2 infection and should not be used as the sole basis for treatment or other patient management  decisions. A negative result may  occur with  improper specimen collection/handling, submission of specimen other than nasopharyngeal swab, presence of viral mutation(s) within the areas targeted by this assay, and inadequate number of viral copies(<138 copies/mL). A negative result must be combined with clinical observations, patient history, and epidemiological information. The expected result is Negative.  Fact Sheet for Patients:  EntrepreneurPulse.com.au  Fact Sheet for Healthcare Providers:  IncredibleEmployment.be  This test is no                          t yet approved or cleared by the [place] FDA and  has been authorized for detection and/or diagnosis of SARS-CoV-2 by FDA under an Emergency Use Authorization (EUA). This EUA will remain  in effect (meaning this test can be used) for the duration of the COVID-19 declaration under Section 564(b)(1) of the Act, 21 U.S.C.section 360bbb-3(b)(1), unless the authorization is terminated  or revoked sooner.       Influenza A by PCR 06/26/2021 NEGATIVE  NEGATIVE Final   Influenza B by PCR 06/26/2021 NEGATIVE  NEGATIVE Final   Comment: (NOTE) The Xpert Xpress SARS-CoV-2/FLU/RSV plus assay is intended as an aid in the diagnosis of influenza from Nasopharyngeal swab specimens and should not be used as a sole basis for treatment. Nasal washings and aspirates are unacceptable for Xpert Xpress SARS-CoV-2/FLU/RSV testing.  Fact Sheet for Patients: EntrepreneurPulse.com.au  Fact Sheet for Healthcare Providers: IncredibleEmployment.be  This test is not yet approved or cleared by the [place] FDA and has been authorized for detection and/or diagnosis of SARS-CoV-2 by FDA under an Emergency Use Authorization (EUA). This EUA will remain in effect (meaning this test can be used) for the duration of the COVID-19 declaration under Section 564(b)(1) of the Act, 21 U.S.C. section  360bbb-3(b)(1), unless the authorization is terminated or revoked.  Performed at [facility], [place] [address] [place], [place] [address]    Sodium 06/26/2021 134 (A) 135 - 145 mmol/L Final   Potassium 06/26/2021 3.6  3.5 - 5.1 mmol/L Final   Chloride 06/26/2021 101  98 - 111 mmol/L Final   CO2 06/26/2021 24  22 - 32 mmol/L Final   Glucose, Bld 06/26/2021 90  70 - 99 mg/dL Final   Glucose reference range applies only to samples taken after fasting for at least 8 hours.   BUN 06/26/2021 13  6 - 20 mg/dL Final   Creatinine, Ser 06/26/2021 0.71  0.44 - 1.00 mg/dL Final   Calcium 06/26/2021 9.5  8.9 - 10.3 mg/dL Final   Total Protein 06/26/2021 7.7  6.5 - 8.1 g/dL Final   Albumin 06/26/2021 4.1  3.5 - 5.0 g/dL Final   AST 06/26/2021 12 (A) 15 - 41 U/L Final   ALT 06/26/2021 8  0 - 44 U/L Final   Alkaline Phosphatase 06/26/2021 41  38 - 126 U/L Final   Total Bilirubin 06/26/2021 0.4  0.3 - 1.2 mg/dL Final   GFR, Estimated 06/26/2021 >60  >60 mL/min Final   Comment: (NOTE) Calculated using the CKD-EPI Creatinine Equation (2021)    Anion gap 06/26/2021 9  5 - 15 Final   Performed at [facility], [place] [address], [place], [place] [address]   Alcohol, Ethyl (B) 06/26/2021 <10  <10 mg/dL Final   Comment: (NOTE) Lowest detectable limit for serum alcohol is 10 mg/dL.  For medical purposes only. Performed at [facility], [place] [address]  [place], [place] [address]    Opiates 06/26/2021 NONE DETECTED  NONE DETECTED Final   Cocaine 06/26/2021 NONE DETECTED  NONE DETECTED Final   Benzodiazepines 06/26/2021 NONE DETECTED  NONE DETECTED Final   Amphetamines 06/26/2021 NONE DETECTED  NONE DETECTED Final   Tetrahydrocannabinol 06/26/2021 POSITIVE (A) NONE DETECTED Final   Barbiturates 06/26/2021 NONE DETECTED  NONE DETECTED Final   Comment: (NOTE) DRUG SCREEN FOR MEDICAL PURPOSES ONLY.  IF CONFIRMATION IS NEEDED FOR ANY PURPOSE, NOTIFY  LAB WITHIN 5 DAYS.  LOWEST DETECTABLE LIMITS FOR URINE DRUG SCREEN Drug Class                     Cutoff (ng/mL) Amphetamine and metabolites    1000 Barbiturate and metabolites    200 Benzodiazepine                 950 Tricyclics and metabolites     300 Opiates and metabolites        300 Cocaine and metabolites        300 THC                            50 Performed at [facility], [place] [address] [place], [place] [address]    WBC 06/26/2021 7.9  4.0 - 10.5 K/uL Final   RBC 06/26/2021 5.21 (A) 3.87 - 5.11 MIL/uL Final   Hemoglobin 06/26/2021 11.9 (A) 12.0 - 15.0 g/dL Final   HCT 06/26/2021 38.3  36.0 - 46.0 % Final   MCV 06/26/2021 73.5 (A) 80.0 - 100.0 fL Final   MCH 06/26/2021 22.8 (A) 26.0 - 34.0 pg Final   MCHC 06/26/2021 31.1  30.0 - 36.0 g/dL Final   RDW 06/26/2021 18.4 (A) 11.5 - 15.5 % Final   Platelets 06/26/2021 357  150 - 400 K/uL Final   nRBC 06/26/2021 0.0  0.0 - 0.2 % Final   Neutrophils Relative % 06/26/2021 49  % Final   Neutro Abs 06/26/2021 4.0  1.7 - 7.7 K/uL Final   Lymphocytes Relative 06/26/2021 37  % Final   Lymphs Abs 06/26/2021 2.9  0.7 - 4.0 K/uL Final   Monocytes Relative 06/26/2021 6  % Final   Monocytes Absolute 06/26/2021 0.5  0.1 - 1.0 K/uL Final   Eosinophils Relative 06/26/2021 7  % Final   Eosinophils Absolute 06/26/2021 0.6 (A) 0.0 - 0.5 K/uL Final   Basophils Relative 06/26/2021 1  % Final   Basophils Absolute 06/26/2021 0.0  0.0 - 0.1 K/uL Final   Immature Granulocytes 06/26/2021 0  % Final   Abs Immature Granulocytes 06/26/2021 0.03  0.00 - 0.07 K/uL Final   Performed at [facility], [place] [address] [place], [place] [address]   I-stat hCG, quantitative 06/26/2021 <5.0  <5 mIU/mL Final   Comment 3 06/26/2021          Final   Comment:   GEST. AGE      CONC.  (mIU/mL)   <=1 WEEK        5 - 50     2 WEEKS       50 - 500     3 WEEKS       100 - 10,000     4 WEEKS     1,000 - 30,000        FEMALE AND  NON-PREGNANT FEMALE:     LESS THAN 5 mIU/mL   Admission on 04/01/2021,  Discharged on 04/01/2021  Component Date Value Ref Range Status   SARS Coronavirus 2 04/01/2021 NEGATIVE  NEGATIVE Final   Comment: (NOTE) SARS-CoV-2 target nucleic acids are NOT DETECTED.  The SARS-CoV-2 RNA is generally detectable in upper and lower respiratory specimens during the acute phase of infection. Negative results do not preclude SARS-CoV-2 infection, do not rule out co-infections with other pathogens, and should not be used as the sole basis for treatment or other patient management decisions. Negative results must be combined with clinical observations, patient history, and epidemiological information. The expected result is Negative.  Fact Sheet for Patients: SugarRoll.be  Fact Sheet for Healthcare Providers: https://www.woods-mathews.com/  This test is not yet approved or cleared by the [place] FDA and  has been authorized for detection and/or diagnosis of SARS-CoV-2 by FDA under an Emergency Use Authorization (EUA). This EUA will remain  in effect (meaning this test can be used) for the duration of the COVID-19 declaration under Se                          ction 564(b)(1) of the Act, 21 U.S.C. section 360bbb-3(b)(1), unless the authorization is terminated or revoked sooner.  Performed at [facility], [place] [address] [place], [place] [address]   Admission on 01/19/2021, Discharged on 01/21/2021  Component Date Value Ref Range Status   WBC 01/19/2021 10.3  4.0 - 10.5 K/uL Final   RBC 01/19/2021 5.15 (A) 3.87 - 5.11 MIL/uL Final   Hemoglobin 01/19/2021 11.6 (A) 12.0 - 15.0 g/dL Final   HCT 01/19/2021 38.3  36.0 - 46.0 % Final   MCV 01/19/2021 74.4 (A) 80.0 - 100.0 fL Final   MCH 01/19/2021 22.5 (A) 26.0 - 34.0 pg Final   MCHC 01/19/2021 30.3  30.0 - 36.0 g/dL Final   RDW 01/19/2021 17.1 (A) 11.5 - 15.5 % Final   Platelets 01/19/2021  336  150 - 400 K/uL Final   nRBC 01/19/2021 0.0  0.0 - 0.2 % Final   Performed at [facility], [place] [address], [place], [place] [address]   Sodium 01/19/2021 137  135 - 145 mmol/L Final   Potassium 01/19/2021 3.2 (A) 3.5 - 5.1 mmol/L Final   Chloride 01/19/2021 105  98 - 111 mmol/L Final   CO2 01/19/2021 21 (A) 22 - 32 mmol/L Final   Glucose, Bld 01/19/2021 116 (A) 70 - 99 mg/dL Final   Glucose reference range applies only to samples taken after fasting for at least 8 hours.   BUN 01/19/2021 10  6 - 20 mg/dL Final   Creatinine, Ser 01/19/2021 0.77  0.44 - 1.00 mg/dL Final   Calcium 01/19/2021 8.9  8.9 - 10.3 mg/dL Final   Total Protein 01/19/2021 8.0  6.5 - 8.1 g/dL Final   Albumin 01/19/2021 4.1  3.5 - 5.0 g/dL Final   AST 01/19/2021 14 (A) 15 - 41 U/L Final   ALT 01/19/2021 10  0 - 44 U/L Final   Alkaline Phosphatase 01/19/2021 53  38 - 126 U/L Final   Total Bilirubin 01/19/2021 0.7  0.3 - 1.2 mg/dL Final   GFR, Estimated 01/19/2021 >60  >60 mL/min Final   Comment: (NOTE) Calculated using the CKD-EPI Creatinine Equation (2021)    Anion gap 01/19/2021 11  5 - 15 Final   Performed at [facility], [place] [address] [place], [place] [address]   B Natriuretic Peptide 01/19/2021 20.8  0.0 - 100.0 pg/mL  Final   Performed at [facility], [place] [address] [place], [place] [address]   SARS Coronavirus 2 by RT PCR 01/19/2021 NEGATIVE  NEGATIVE Final   Comment: (NOTE) SARS-CoV-2 target nucleic acids are NOT DETECTED.  The SARS-CoV-2 RNA is generally detectable in upper respiratory specimens during the acute phase of infection. The lowest concentration of SARS-CoV-2 viral copies this assay can detect is 138 copies/mL. A negative result does not preclude SARS-Cov-2 infection and should not be used as the sole basis for treatment or other patient management decisions. A negative result may occur with  improper specimen  collection/handling, submission of specimen other than nasopharyngeal swab, presence of viral mutation(s) within the areas targeted by this assay, and inadequate number of viral copies(<138 copies/mL). A negative result must be combined with clinical observations, patient history, and epidemiological information. The expected result is Negative.  Fact Sheet for Patients:  EntrepreneurPulse.com.au  Fact Sheet for Healthcare Providers:  IncredibleEmployment.be  This test is no                          t yet approved or cleared by the [place] FDA and  has been authorized for detection and/or diagnosis of SARS-CoV-2 by FDA under an Emergency Use Authorization (EUA). This EUA will remain  in effect (meaning this test can be used) for the duration of the COVID-19 declaration under Section 564(b)(1) of the Act, 21 U.S.C.section 360bbb-3(b)(1), unless the authorization is terminated  or revoked sooner.       Influenza A by PCR 01/19/2021 NEGATIVE  NEGATIVE Final   Influenza B by PCR 01/19/2021 NEGATIVE  NEGATIVE Final   Comment: (NOTE) The Xpert Xpress SARS-CoV-2/FLU/RSV plus assay is intended as an aid in the diagnosis of influenza from Nasopharyngeal swab specimens and should not be used as a sole basis for treatment. Nasal washings and aspirates are unacceptable for Xpert Xpress SARS-CoV-2/FLU/RSV testing.  Fact Sheet for Patients: EntrepreneurPulse.com.au  Fact Sheet for Healthcare Providers: IncredibleEmployment.be  This test is not yet approved or cleared by the [place] FDA and has been authorized for detection and/or diagnosis of SARS-CoV-2 by FDA under an Emergency Use Authorization (EUA). This EUA will remain in effect (meaning this test can be used) for the duration of the COVID-19 declaration under Section 564(b)(1) of the Act, 21 U.S.C. section 360bbb-3(b)(1), unless the authorization is  terminated or revoked.  Performed at [facility], [place] [address], [place], [place] [address]    D-Dimer, America Brown 01/19/2021 >20.00 (A) 0.00 - 0.50 ug/mL-FEU Final   Comment: (NOTE) At the manufacturer cut-off value of 0.5 g/mL FEU, this assay has a negative predictive value of 95-100%.This assay is intended for use in conjunction with a clinical pretest probability (PTP) assessment model to exclude pulmonary embolism (PE) and deep venous thrombosis (DVT) in outpatients suspected of PE or DVT. Results should be correlated with clinical presentation. Performed at [facility], [place] [address] [place],  [address]    I-stat hCG, quantitative 01/19/2021 <5.0  <5 mIU/mL Final   Comment 3 01/19/2021          Final   Comment:   GEST. AGE      CONC.  (mIU/mL)   <=1 WEEK        5 - 50     2 WEEKS       50 - 500     3 WEEKS  100 - 10,000     4 WEEKS     1,000 - 30,000        FEMALE AND NON-PREGNANT FEMALE:     LESS THAN 5 mIU/mL    FIO2 01/19/2021 21.00   Final   pH, Ven 01/19/2021 7.326  7.250 - 7.430 Final   pCO2, Ven 01/19/2021 48.5  44.0 - 60.0 mmHg Final   pO2, Ven 01/19/2021 41.7  32.0 - 45.0 mmHg Final   Bicarbonate 01/19/2021 24.6  20.0 - 28.0 mmol/L Final   Acid-base deficit 01/19/2021 1.2  0.0 - 2.0 mmol/L Final   O2 Saturation 01/19/2021 69.4  % Final   Patient temperature 01/19/2021 98.6   Final   Performed at [place] [address], [place], [place] [address]   HIV Screen 4th Generation wRfx 01/20/2021 Non Reactive  Non Reactive Final   Performed at [facility], [place] [address], [place], [place] [address]   WBC 01/21/2021 11.7 (A) 4.0 - 10.5 K/uL Final   RBC 01/21/2021 4.37  3.87 - 5.11 MIL/uL Final   Hemoglobin 01/21/2021 9.6 (A) 12.0 - 15.0 g/dL Final   HCT 01/21/2021 32.7 (A) 36.0 - 46.0 % Final   MCV 01/21/2021 74.8 (A) 80.0 - 100.0 fL Final   MCH 01/21/2021 22.0 (A) 26.0 - 34.0 pg Final    MCHC 01/21/2021 29.4 (A) 30.0 - 36.0 g/dL Final   RDW 01/21/2021 17.0 (A) 11.5 - 15.5 % Final   Platelets 01/21/2021 421 (A) 150 - 400 K/uL Final   nRBC 01/21/2021 0.0  0.0 - 0.2 % Final   Performed at [facility], [place] [address] [place], [place] [address]   Sodium 01/21/2021 138  135 - 145 mmol/L Final   Potassium 01/21/2021 3.9  3.5 - 5.1 mmol/L Final   Comment: DELTA CHECK NOTED NO VISIBLE HEMOLYSIS    Chloride 01/21/2021 103  98 - 111 mmol/L Final   CO2 01/21/2021 29  22 - 32 mmol/L Final   Glucose, Bld 01/21/2021 98  70 - 99 mg/dL Final   Glucose reference range applies only to samples taken after fasting for at least 8 hours.   BUN 01/21/2021 16  6 - 20 mg/dL Final   Creatinine, Ser 01/21/2021 0.73  0.44 - 1.00 mg/dL Final   Calcium 01/21/2021 9.2  8.9 - 10.3 mg/dL Final   Total Protein 01/21/2021 7.7  6.5 - 8.1 g/dL Final   Albumin 01/21/2021 3.7  3.5 - 5.0 g/dL Final   AST 01/21/2021 9 (A) 15 - 41 U/L Final   ALT 01/21/2021 8  0 - 44 U/L Final   Alkaline Phosphatase 01/21/2021 46  38 - 126 U/L Final   Total Bilirubin 01/21/2021 0.2 (A) 0.3 - 1.2 mg/dL Final   GFR, Estimated 01/21/2021 >60  >60 mL/min Final   Comment: (NOTE) Calculated using the CKD-EPI Creatinine Equation (2021)    Anion gap 01/21/2021 6  5 - 15 Final   Performed at [facility], [place] [address], [place], [place] [address]    Blood Alcohol level:  Lab Results  Component Value Date   ETH <10 06/26/2021   ETH <5 53/64/6803    Metabolic Disorder Labs: No results found for: HGBA1C, MPG No results found for: PROLACTIN No results found for: CHOL, TRIG, HDL, CHOLHDL, VLDL, LDLCALC  Therapeutic Lab Levels: No results found for: LITHIUM No results found for: VALPROATE No components found for:  CBMZ  Physical Findings   GAD-7  Flowsheet Row Counselor from 06/10/2021 in [facility] Office Visit from 02/02/2018 in [place] for [facility] Routine Prenatal from 11/03/2017 in [place] for [facility] Routine Prenatal from 10/20/2017 in [place] for [facility] Routine Prenatal from 09/09/2017 in [place] for [facility]  Total GAD-7 Score 21 8 0 10 6      PHQ2-9    Flowsheet Row Counselor from 06/10/2021 in [facility] Office Visit from 02/02/2018 in [place] for [facility] Routine Prenatal from 11/03/2017 in [place] for [facility] Routine Prenatal from 10/20/2017 in [place] for [facility] Routine Prenatal from 09/09/2017 in [place] for [facility]  PHQ-2 Total Score 6 2 0 0 0  PHQ-9 Total Score 20 9 0 0 11      [place] [facility] from 06/27/2021 in [facility] ED from 06/26/2021 in [place] DEPT Counselor from 06/10/2021 in [place] CATEGORY High Risk Low Risk Low Risk        Musculoskeletal  Strength & Muscle Tone: within normal limits Gait & Station: normal Patient leans: N/A  Psychiatric Specialty Exam  Presentation  General Appearance: Appropriate for Environment  Eye Contact:Fair  Speech:Clear and Coherent  Speech Volume:Normal  Handedness:Right   Mood and Affect  Mood:Dysphoric  Affect:Congruent   Thought Process  Thought Processes:Coherent  Descriptions of Associations:Intact  Orientation:Full (Time, Place and Person)  Thought Content:Logical  Diagnosis of Schizophrenia or Schizoaffective disorder in past: Yes ("schizophrenic  delusional" per patient)    Hallucinations:Hallucinations: None  Ideas of Reference:None  Suicidal Thoughts:Suicidal Thoughts: No  Homicidal Thoughts:Homicidal Thoughts: No   Sensorium  Memory:Immediate Fair; Recent Fair; Remote Fair  Judgment:Fair  Insight:Fair   Executive Functions   Concentration:Fair  Attention Span:Fair  [place]   Psychomotor Activity  Psychomotor Activity:Psychomotor Activity: Normal   Assets  Assets:Communication Skills; Desire for Improvement   Sleep  Sleep:Sleep: Fair Number of Hours of Sleep: 5   No data recorded  Physical Exam  Physical Exam Constitutional:      Appearance: Normal appearance.  HENT:     Head: Atraumatic.  Eyes:     Conjunctiva/sclera: Conjunctivae normal.  Cardiovascular:     Rate and Rhythm: Tachycardia present.  Pulmonary:     Effort: Pulmonary effort is normal.  Musculoskeletal:        General: Normal range of motion.     Cervical back: Normal range of motion.  Neurological:     Mental Status: She is alert and oriented to person, place, and time.   Review of Systems  Constitutional: Negative.   HENT: Negative.    Eyes: Negative.   Respiratory: Negative.    Cardiovascular: Negative.   Gastrointestinal: Negative.   Genitourinary: Negative.   Skin: Negative.   Neurological: Negative.   Endo/Heme/Allergies: Negative.   Blood pressure (!) 136/100, pulse 86, temperature 98 F (36.7 C), temperature source Oral, resp. rate 18, last menstrual period 06/03/2021, SpO2 98 %. There is no height or weight on file to calculate BMI.  Treatment Plan Summary:  PLAN OF CARE:  40 year old woman with history of schizoaffective disorder, GAD, who presented to [facility] yesterday and chief complaint suicidal thoughts.  She was assessed by TTS and psych NP who recommended that she be transferred to the [facility] after medical clearance in the ED.  Patient medically cleared in the ED after  lab work and abdominal imaging. Patient was transferred to the [facility] 10/13  for further treatment, safety, stabilization.    Patient reported abdominal pain 10/13 and EMS was called; however, after beign informed that there would likely be a long wait time before she was to be seen, she  declined to go--after discussing with staff, there appeared to be some concern that there may have been an element of medication seeking as patient received fentanyl in the ED.   Schizoaffective disorder Anxiety PTSD -continue latuda 80 mg (changed to QHS) -continue prn vistaril -prazosin 2 mg qhs   HTN -continue norvasc 10 mg -continue of carvidolol 6.25 mg BID   Asthma -continue albuterol  RLS -will continue mirapex 0.25 mg for now. Could consider to d/c if appears to be contributing to psychosis (as is DA agonist-however, paranoia appears to be decreased today)     Abdominal pain -Bentyl 10mg  PO q6hr PRN for abdominal muscle cramps   Dispo: Ongoing. Will consult with SW for assistance. Patient has a residence; patient is interested in PHP-SW to assist.   Marissa Calamity, NP 06/29/2021 9:43 AM

## 2021-06-29 NOTE — ED Notes (Signed)
Pt did not participate in group, she did coloring and read magazines

## 2021-06-29 NOTE — ED Notes (Signed)
Patient resting quietly with eyes closed. Respirations even and unlabored. No Signs Symptoms of distress. Will continue to monitor for safety.

## 2021-06-29 NOTE — ED Notes (Signed)
Patient requested Tylenol for her abdominal cramps. Pain 7 out of 10.

## 2021-06-29 NOTE — ED Notes (Addendum)
Patient in shower in no acute distress. Safety maintained.

## 2021-06-29 NOTE — ED Notes (Signed)
Patient quiet in room.  No C/O at this time - denies SI,HI, AVH - no distress noted - will continue to monitor for safety

## 2021-06-29 NOTE — ED Notes (Signed)
Pt resting in room in no acute distress. Refused meal at present. Denies concerns. Safety maintained.

## 2021-06-29 NOTE — ED Notes (Addendum)
Pt sitting up in hallway coloring. Pt stated, "I feel a 10 today. I got some good sleep last night and now everything is clear. I am not suicidal nor do I want to hurt anyone else. I feel like I can take on the world now. I am 80% today". Support given. Pt refused to take Latuda this am. Stated it makes her sedated and request to take medication at HS. NP already aware per pt. Will follow up with NP. Safety maintained.

## 2021-06-29 NOTE — ED Notes (Addendum)
Pt resting in no acute distress. RR even and unlabored. Safety maintained. 

## 2021-06-29 NOTE — ED Notes (Signed)
Patient is resting quietly in bed with eyes closed no S/S of distress noted. Respirations even and unlabored. Will continue to monitor for safety.

## 2021-06-29 NOTE — ED Notes (Signed)
Patient's updated BP 160/98 (manually), reported to NP via secure chat. Pt received her scheduled Coreg and educated on foods low in Na+ and sugars. Safety maintained.

## 2021-06-30 ENCOUNTER — Emergency Department (HOSPITAL_COMMUNITY)
Admission: EM | Admit: 2021-06-30 | Discharge: 2021-06-30 | Disposition: A | Discharge status: Home / Self Care | Payer: Medicaid Other | Attending: Student | Admitting: Student

## 2021-06-30 DIAGNOSIS — Z5321 Procedure and treatment not carried out due to patient leaving prior to being seen by health care provider: Secondary | ICD-10-CM | POA: Insufficient documentation

## 2021-06-30 DIAGNOSIS — F411 Generalized anxiety disorder: Secondary | ICD-10-CM | POA: Diagnosis not present

## 2021-06-30 DIAGNOSIS — F4310 Post-traumatic stress disorder, unspecified: Secondary | ICD-10-CM | POA: Diagnosis not present

## 2021-06-30 DIAGNOSIS — I10 Essential (primary) hypertension: Secondary | ICD-10-CM | POA: Insufficient documentation

## 2021-06-30 DIAGNOSIS — F332 Major depressive disorder, recurrent severe without psychotic features: Secondary | ICD-10-CM | POA: Diagnosis not present

## 2021-06-30 DIAGNOSIS — F259 Schizoaffective disorder, unspecified: Secondary | ICD-10-CM | POA: Diagnosis not present

## 2021-06-30 DIAGNOSIS — R109 Unspecified abdominal pain: Secondary | ICD-10-CM | POA: Insufficient documentation

## 2021-06-30 LAB — COMPREHENSIVE METABOLIC PANEL
ALT: 8 U/L (ref 0–44)
AST: 13 U/L — ABNORMAL LOW (ref 15–41)
Albumin: 3.7 g/dL (ref 3.5–5.0)
Alkaline Phosphatase: 38 U/L (ref 38–126)
Anion gap: 9 (ref 5–15)
BUN: 9 mg/dL (ref 6–20)
CO2: 24 mmol/L (ref 22–32)
Calcium: 9.1 mg/dL (ref 8.9–10.3)
Chloride: 101 mmol/L (ref 98–111)
Creatinine, Ser: 0.73 mg/dL (ref 0.44–1.00)
GFR, Estimated: >60 mL/min (ref >60)
Glucose, Bld: 109 mg/dL — ABNORMAL HIGH (ref 70–99)
Potassium: 3.5 mmol/L (ref 3.5–5.1)
Sodium: 134 mmol/L — ABNORMAL LOW (ref 135–145)
Total Bilirubin: 0.4 mg/dL (ref 0.3–1.2)
Total Protein: 7 g/dL (ref 6.5–8.1)

## 2021-06-30 LAB — URINALYSIS, ROUTINE W REFLEX MICROSCOPIC
Bilirubin Urine: NEGATIVE
Glucose, UA: NEGATIVE mg/dL
Ketones, ur: 20 mg/dL — AB
Nitrite: NEGATIVE
Protein, ur: 30 mg/dL — AB
Specific Gravity, Urine: 1.031 — ABNORMAL HIGH (ref 1.005–1.030)
pH: 5 (ref 5.0–8.0)

## 2021-06-30 LAB — CBC WITH DIFFERENTIAL/PLATELET
Abs Immature Granulocytes: 0.02 K/uL (ref 0.00–0.07)
Basophils Absolute: 0.1 K/uL (ref 0.0–0.1)
Basophils Relative: 1 %
Eosinophils Absolute: 1.2 K/uL — ABNORMAL HIGH (ref 0.0–0.5)
Eosinophils Relative: 17 %
HCT: 36.6 % (ref 36.0–46.0)
Hemoglobin: 11.2 g/dL — ABNORMAL LOW (ref 12.0–15.0)
Immature Granulocytes: 0 %
Lymphocytes Relative: 27 %
Lymphs Abs: 1.9 K/uL (ref 0.7–4.0)
MCH: 22.4 pg — ABNORMAL LOW (ref 26.0–34.0)
MCHC: 30.6 g/dL (ref 30.0–36.0)
MCV: 73.2 fL — ABNORMAL LOW (ref 80.0–100.0)
Monocytes Absolute: 0.4 K/uL (ref 0.1–1.0)
Monocytes Relative: 5 %
Neutro Abs: 3.5 K/uL (ref 1.7–7.7)
Neutrophils Relative %: 50 %
Platelets: 392 K/uL (ref 150–400)
RBC: 5 MIL/uL (ref 3.87–5.11)
RDW: 17.9 % — ABNORMAL HIGH (ref 11.5–15.5)
WBC: 7.1 K/uL (ref 4.0–10.5)
nRBC: 0 % (ref 0.0–0.2)

## 2021-06-30 LAB — LIPASE, BLOOD: Lipase: 28 U/L (ref 11–51)

## 2021-06-30 MED ORDER — GUAIFENESIN ER 600 MG PO TB12: 600.0000 mg | ORAL_TABLET | Freq: Once | ORAL | Status: DC | PRN

## 2021-06-30 NOTE — ED Provider Notes (Signed)
Emergency Medicine Provider Triage Evaluation Note  Susan Walton , a 40 y.o. female  was evaluated in triage.  Pt complains of abdominal pain x2 weeks.  Was previously admitted in psych, had ultrasound and lab work done.  Nothing acute.  Patient states the same change,.  Review of Systems  Positive: Abdominal pain Negative: Fever  Physical Exam  BP (!) 155/125 (BP Location: Right Arm)   Pulse 89   Temp 99 F (37.2 C) (Oral)   Resp 19   LMP 06/03/2021 (Approximate) Comment: negativebeta HCG 06/26/21  SpO2 100%  Gen:   Awake, no distress   Resp:  Normal effort  MSK:   Moves extremities without difficulty  Other:  Diffuse abdominal pain, soft without  Medical Decision Making  Medically screening exam initiated at 2:00 PM.  Appropriate orders placed.  Deretha Ertle was informed that the remainder of the evaluation will be completed by another provider, this initial triage assessment does not replace that evaluation, and the importance of remaining in the ED until their evaluation is complete.  Abdominal labs   Sherrill Raring, Hershal Coria 06/30/21 [place], Nathan, MD 06/30/21 [id number]

## 2021-06-30 NOTE — ED Notes (Signed)
Patient discharge home. Patient denies SI/HI and A/V/H. AV'S reviewed by patient and she verbalized understanding. All belongings returned to patent at discharge. Patient escorted off unit by staff to catch the bus.

## 2021-06-30 NOTE — ED Triage Notes (Signed)
Pt. Stated, Im concerned because I m having high BP because of the stomach cramping and I cant go to the bathroom it hurts.

## 2021-06-30 NOTE — ED Notes (Signed)
REFUSED LUNCH

## 2021-06-30 NOTE — ED Provider Notes (Addendum)
Discharge summary note  Date and Time: 06/30/2021 8:34 AM Name: Susan Walton MRN:  [id number]  Subjective:  40 year old woman with history of schizoaffective disorder, GAD, who presented to [facility] yesterday and chief complaint suicidal thoughts.  She was assessed by TTS and psych NP who recommended that she be transferred to the [facility]. Patient was transferred to the [facility] today for further treatment, safety, stabilization   Patient seen and reevaluated face-to-face by this provider, chart reviewed and cased discussed with [name]. On evaluation, patient is alert and oriented x4. Her thought process is logical and speech is coherent. Her mood appears dysphoric and affect is congruent. She denies having thoughts of wanting to hurt herself or others. She states that her mood is "okay" and she "feels happy today." She reports hearing voices last night telling her someone is trying to drug her and trying to take her baby. She reports waking up out of her sleep having outbursts and caught herself yelling and talking to herself. She denies AVH currently. She does not appear to be responding to internal or external stimuli. She reports a fair appetite. She is compliant with taking the prescribed medications and denies any side effects to the Latuda this morning. She rates her pain in her abdomen 0/10.  Update: Patient requesting to be discharged home. She denies SI-HI-AVH. She reports having intermittent abdominal pain for the past 2 weeks. She reports that she initially went to the hospital and was evaluated for her abdominal pain but continues to have the pain. She describes the pain as intermittent contractions in the right upper quadrant of her abdomen. She refused to take as needed medications from the nursing staff when offered. She is requesting to be discharged today so she can follow up with her primary regarding her pain. She states that a good friend of hers named [name] will pick her up from  this facility.  With the patient's consent I called the patient's daughter Susan Walton who is 14 years old who the patient resides with.  She states that she has no safety concerns with her mother returning home.  She states that her mother does not have access to weapons including guns and knives in the home.  She states that her mother has been having abdominal pain for a while.  She confirms that [name] can pick up the patient from the facility today.  Safety Plan [name] will reach out to [name] or mother (evelyn), call 911 or call mobile crisis if condition worsens or if suicidal thoughts become active Patients' will follow up with [name] and Dr. [name] for outpatient services (medication management).  The suicide prevention education provided includes the following: Suicide risk factors Suicide prevention and interventions National Suicide Hotline telephone number [facility] assessment telephone number [place] and/or Residential Mobile Crisis Unit telephone number Request made of family/significant other to:  Susan Walton (daughter) Engineer, maintenance (e.g., guns, rifles, knives), all items previously/currently identified as safety concern.   Remove drugs/medications (over the counter, prescriptions, illicit drugs), all items previously/currently identified as a safety concern.    Diagnosis:  Final diagnoses:  Depression, unspecified depression type  GAD (generalized anxiety disorder)  PTSD (post-traumatic stress disorder)  Schizoaffective disorder, unspecified type ([place])    Total Time spent with patient: 15 minutes  Past Psychiatric History: Previous Medication Trials: latuda, trazodone, prazosin Previous Psychiatric Hospitalizations: yes, 3 apart from this one. Most recently in 2018 for SI, first hospitalization at  40 yo Previous Suicide Attempts: states that she almost jumped off a bridge but that someone stopped her.  Does not appear that she actually attempted per description History of Violence: no Outpatient psychiatrist: [name]  Past Medical History:  Past Medical History:  Diagnosis Date   Anemia    Asthma attack 01/19/2021   Fibroid    Hypertension    Retinal detachment    Thyroid disease    Hypothyroidism    Past Surgical History:  Procedure Laterality Date   DILATION AND CURETTAGE OF UTERUS     EYE SURGERY     TUBAL LIGATION N/A 12/11/2017   Procedure: POST PARTUM TUBAL LIGATION;  Surgeon: [name], MD;  Location: [place];  Service: Gynecology;  Laterality: N/A;   Family History:  Family History  Problem Relation Age of Onset   COPD Maternal Grandfather    Liver disease Paternal Grandmother    Liver disease Paternal Grandfather    Family Psychiatric  History: randmother "talked to herself"; unclear if formal psychiatric dx was given  Social History:  Social History   Substance and Sexual Activity  Alcohol Use Not Currently     Social History   Substance and Sexual Activity  Drug Use Not Currently    Social History   Socioeconomic History   Marital status: Single    Spouse name: Not on file   Number of children: Not on file   Years of education: Not on file   Highest education level: Not on file  Occupational History   Not on file  Tobacco Use   Smoking status: Former    Packs/day: 0.50    Types: Cigarettes   Smokeless tobacco: Never  Vaping Use   Vaping Use: Never used  Substance and Sexual Activity   Alcohol use: Not Currently   Drug use: Not Currently   Sexual activity: Yes    Partners: Male    Birth control/protection: Condom    Comment: multiple parteners  Other Topics Concern   Not on file  Social History Narrative   ** Merged History Encounter **       Social Determinants of Health   Financial Resource Strain: High Risk   Difficulty of Paying Living Expenses: Very hard  Food Insecurity: No Food Insecurity   Worried About  Charity fundraiser in the Last Year: Never true   Ran Out of Food in the Last Year: Never true  Transportation Needs: No Transportation Needs   Lack of Transportation (Medical): No   Lack of Transportation (Non-Medical): No  Physical Activity: Insufficiently Active   Days of Exercise per Week: 2 days   Minutes of Exercise per Session: 20 min  Stress: Stress Concern Present   Feeling of Stress : Very much  Social Connections: Socially Isolated   Frequency of Communication with Friends and Family: Never   Frequency of Social Gatherings with Friends and Family: Never   Attends Religious Services: Never   Marine scientist or Organizations: No   Attends Music therapist: Never   Marital Status: Divorced   SDOH:  SDOH Screenings   Alcohol Screen: Low Risk    Last Alcohol Screening Score (AUDIT): 0  Depression (PHQ2-9): Medium Risk   PHQ-2 Score: 20  Financial Resource Strain: High Risk   Difficulty of Paying Living Expenses: Very hard  Food Insecurity: No Food Insecurity   Worried About Charity fundraiser in the Last Year: Never true   Ran Out of  Food in the Last Year: Never true  Housing: High Risk   Last Housing Risk Score: 2  Physical Activity: Insufficiently Active   Days of Exercise per Week: 2 days   Minutes of Exercise per Session: 20 min  Social Connections: Socially Isolated   Frequency of Communication with Friends and Family: Never   Frequency of Social Gatherings with Friends and Family: Never   Attends Religious Services: Never   Marine scientist or Organizations: No   Attends Music therapist: Never   Marital Status: Divorced  Stress: Stress Concern Present   Feeling of Stress : Very much  Tobacco Use: Medium Risk   Smoking Tobacco Use: Former   Smokeless Tobacco Use: Never  Transportation Needs: No Data processing manager (Medical): No   Lack of Transportation (Non-Medical): No   Current  Medications:  Current Facility-Administered Medications  Medication Dose Route Frequency Provider Last Rate Last Admin   acetaminophen (TYLENOL) tablet 650 mg  650 mg Oral Q6H PRN Ival Bible, MD   650 mg at 06/29/21 2108   albuterol (VENTOLIN HFA) 108 (90 Base) MCG/ACT inhaler 2 puff  2 puff Inhalation Q2H PRN Suella Broad, FNP   2 puff at 06/30/21 0359   alum & mag hydroxide-simeth (MAALOX/MYLANTA) 200-200-20 MG/5ML suspension 30 mL  30 mL Oral Q4H PRN Ival Bible, MD       amLODipine (NORVASC) tablet 10 mg  10 mg Oral Daily Suella Broad, FNP   10 mg at 06/29/21 0843   carvedilol (COREG) tablet 6.25 mg  6.25 mg Oral BID WC Ival Bible, MD   6.25 mg at 06/29/21 1654   dicyclomine (BENTYL) capsule 10 mg  10 mg Oral Q6H PRN Ival Bible, MD   10 mg at 06/29/21 1655   guaiFENesin (MUCINEX) 12 hr tablet 600 mg  600 mg Oral Once PRN Bobbitt, Shalon E, NP       hydrOXYzine (ATARAX/VISTARIL) tablet 25 mg  25 mg Oral TID PRN Suella Broad, FNP   25 mg at 06/27/21 2326   ibuprofen (ADVIL) tablet 800 mg  800 mg Oral Q6H PRN Ival Bible, MD       lurasidone (LATUDA) tablet 80 mg  80 mg Oral QHS Lynnex Fulp L, NP   80 mg at 06/29/21 2109   pramipexole (MIRAPEX) tablet 0.25 mg  0.25 mg Oral QHS Suella Broad, FNP   0.25 mg at 06/29/21 2108   prazosin (MINIPRESS) capsule 2 mg  2 mg Oral QHS Suella Broad, FNP   2 mg at 06/29/21 2108   Current Outpatient Medications  Medication Sig Dispense Refill   acetaminophen (TYLENOL) 500 MG tablet Take 1,000 mg by mouth every 3 (three) hours as needed (for severe pain).     albuterol (VENTOLIN HFA) 108 (90 Base) MCG/ACT inhaler Inhale 1-2 puffs into the lungs every 6 (six) hours as needed for wheezing or shortness of breath. (Patient not taking: Reported on 06/26/2021) 18 g 0   amLODipine (NORVASC) 10 MG tablet Take 1 tablet (10 mg total) by mouth daily. 30 tablet 0   bismuth  subsalicylate (PEPTO BISMOL) 262 MG/15ML suspension Take 30 mLs by mouth every 6 (six) hours as needed for indigestion.     carvedilol (COREG) 6.25 MG tablet Take 1 tablet (6.25 mg total) by mouth 2 (two) times daily with a meal. (Patient not taking: No sig reported) 60 tablet 0   diphenhydrAMINE (BENADRYL)  25 MG tablet Take 25-50 mg by mouth every 6 (six) hours as needed for allergies.     fluticasone (FLONASE) 50 MCG/ACT nasal spray Place 2 sprays into both nostrils daily.     guaiFENesin-dextromethorphan (ROBITUSSIN DM) 100-10 MG/5ML syrup Take 15 mLs by mouth every 4 (four) hours as needed for cough.     hydrOXYzine (VISTARIL) 25 MG capsule Take 25 mg by mouth 3 (three) times daily as needed for anxiety.     LATUDA 80 MG TABS tablet Take 80 mg by mouth daily with breakfast.     ondansetron (ZOFRAN) 4 MG tablet Take 4 mg by mouth every 6 (six) hours as needed for nausea or vomiting.     pramipexole (MIRAPEX) 0.25 MG tablet Take 0.25 mg by mouth at bedtime.     prazosin (MINIPRESS) 2 MG capsule Take 2 mg by mouth at bedtime.     PROAIR HFA 108 (90 Base) MCG/ACT inhaler Inhale 2 puffs into the lungs every 2 (two) hours as needed for shortness of breath.     sodium chloride (OCEAN) 0.65 % SOLN nasal spray Place 1 spray into both nostrils as needed for congestion. 30 mL 0   traZODone (DESYREL) 100 MG tablet Take 100 mg by mouth at bedtime. (Patient not taking: No sig reported)      Labs  Lab Results:  Admission on 06/26/2021, Discharged on 06/27/2021  Component Date Value Ref Range Status   SARS Coronavirus 2 by RT PCR 06/26/2021 NEGATIVE  NEGATIVE Final   Comment: (NOTE) SARS-CoV-2 target nucleic acids are NOT DETECTED.  The SARS-CoV-2 RNA is generally detectable in upper respiratory specimens during the acute phase of infection. The lowest concentration of SARS-CoV-2 viral copies this assay can detect is 138 copies/mL. A negative result does not preclude SARS-Cov-2 infection and should  not be used as the sole basis for treatment or other patient management decisions. A negative result may occur with  improper specimen collection/handling, submission of specimen other than nasopharyngeal swab, presence of viral mutation(s) within the areas targeted by this assay, and inadequate number of viral copies(<138 copies/mL). A negative result must be combined with clinical observations, patient history, and epidemiological information. The expected result is Negative.  Fact Sheet for Patients:  EntrepreneurPulse.com.au  Fact Sheet for Healthcare Providers:  IncredibleEmployment.be  This test is no                          t yet approved or cleared by the [place] FDA and  has been authorized for detection and/or diagnosis of SARS-CoV-2 by FDA under an Emergency Use Authorization (EUA). This EUA will remain  in effect (meaning this test can be used) for the duration of the COVID-19 declaration under Section 564(b)(1) of the Act, 21 U.S.C.section 360bbb-3(b)(1), unless the authorization is terminated  or revoked sooner.       Influenza A by PCR 06/26/2021 NEGATIVE  NEGATIVE Final   Influenza B by PCR 06/26/2021 NEGATIVE  NEGATIVE Final   Comment: (NOTE) The Xpert Xpress SARS-CoV-2/FLU/RSV plus assay is intended as an aid in the diagnosis of influenza from Nasopharyngeal swab specimens and should not be used as a sole basis for treatment. Nasal washings and aspirates are unacceptable for Xpert Xpress SARS-CoV-2/FLU/RSV testing.  Fact Sheet for Patients: EntrepreneurPulse.com.au  Fact Sheet for Healthcare Providers: IncredibleEmployment.be  This test is not yet approved or cleared by the [place] FDA and has been authorized for detection and/or diagnosis of  SARS-CoV-2 by FDA under an Emergency Use Authorization (EUA). This EUA will remain in effect (meaning this test can be used) for the  duration of the COVID-19 declaration under Section 564(b)(1) of the Act, 21 U.S.C. section 360bbb-3(b)(1), unless the authorization is terminated or revoked.  Performed at [facility], [place] [address], [place], [place] [address]    Sodium 06/26/2021 134 (A) 135 - 145 mmol/L Final   Potassium 06/26/2021 3.6  3.5 - 5.1 mmol/L Final   Chloride 06/26/2021 101  98 - 111 mmol/L Final   CO2 06/26/2021 24  22 - 32 mmol/L Final   Glucose, Bld 06/26/2021 90  70 - 99 mg/dL Final   Glucose reference range applies only to samples taken after fasting for at least 8 hours.   BUN 06/26/2021 13  6 - 20 mg/dL Final   Creatinine, Ser 06/26/2021 0.71  0.44 - 1.00 mg/dL Final   Calcium 06/26/2021 9.5  8.9 - 10.3 mg/dL Final   Total Protein 06/26/2021 7.7  6.5 - 8.1 g/dL Final   Albumin 06/26/2021 4.1  3.5 - 5.0 g/dL Final   AST 06/26/2021 12 (A) 15 - 41 U/L Final   ALT 06/26/2021 8  0 - 44 U/L Final   Alkaline Phosphatase 06/26/2021 41  38 - 126 U/L Final   Total Bilirubin 06/26/2021 0.4  0.3 - 1.2 mg/dL Final   GFR, Estimated 06/26/2021 >60  >60 mL/min Final   Comment: (NOTE) Calculated using the CKD-EPI Creatinine Equation (2021)    Anion gap 06/26/2021 9  5 - 15 Final   Performed at [facility], [place] [address] [place], [place] [address]   Alcohol, Ethyl (B) 06/26/2021 <10  <10 mg/dL Final   Comment: (NOTE) Lowest detectable limit for serum alcohol is 10 mg/dL.  For medical purposes only. Performed at [facility], [place] [address], [place], [place] [address]    Opiates 06/26/2021 NONE DETECTED  NONE DETECTED Final   Cocaine 06/26/2021 NONE DETECTED  NONE DETECTED Final   Benzodiazepines 06/26/2021 NONE DETECTED  NONE DETECTED Final   Amphetamines 06/26/2021 NONE DETECTED  NONE DETECTED Final   Tetrahydrocannabinol 06/26/2021 POSITIVE (A) NONE DETECTED Final   Barbiturates 06/26/2021 NONE DETECTED  NONE DETECTED Final   Comment:  (NOTE) DRUG SCREEN FOR MEDICAL PURPOSES ONLY.  IF CONFIRMATION IS NEEDED FOR ANY PURPOSE, NOTIFY LAB WITHIN 5 DAYS.  LOWEST DETECTABLE LIMITS FOR URINE DRUG SCREEN Drug Class                     Cutoff ([place]/mL) Amphetamine and metabolites    1000 Barbiturate and metabolites    200 Benzodiazepine                 355 Tricyclics and metabolites     300 Opiates and metabolites        300 Cocaine and metabolites        300 THC                            50 Performed at [facility], [place] [address], [place], [place] [address]    WBC 06/26/2021 7.9  4.0 - 10.5 K/uL Final   RBC 06/26/2021 5.21 (A) 3.87 - 5.11 MIL/uL Final   Hemoglobin 06/26/2021 11.9 (A) 12.0 - 15.0 g/dL Final   HCT 06/26/2021 38.3  36.0 - 46.0 % Final   MCV 06/26/2021 73.5 (A) 80.0 -  100.0 fL Final   MCH 06/26/2021 22.8 (A) 26.0 - 34.0 pg Final   MCHC 06/26/2021 31.1  30.0 - 36.0 g/dL Final   RDW 06/26/2021 18.4 (A) 11.5 - 15.5 % Final   Platelets 06/26/2021 357  150 - 400 K/uL Final   nRBC 06/26/2021 0.0  0.0 - 0.2 % Final   Neutrophils Relative % 06/26/2021 49  % Final   Neutro Abs 06/26/2021 4.0  1.7 - 7.7 K/uL Final   Lymphocytes Relative 06/26/2021 37  % Final   Lymphs Abs 06/26/2021 2.9  0.7 - 4.0 K/uL Final   Monocytes Relative 06/26/2021 6  % Final   Monocytes Absolute 06/26/2021 0.5  0.1 - 1.0 K/uL Final   Eosinophils Relative 06/26/2021 7  % Final   Eosinophils Absolute 06/26/2021 0.6 (A) 0.0 - 0.5 K/uL Final   Basophils Relative 06/26/2021 1  % Final   Basophils Absolute 06/26/2021 0.0  0.0 - 0.1 K/uL Final   Immature Granulocytes 06/26/2021 0  % Final   Abs Immature Granulocytes 06/26/2021 0.03  0.00 - 0.07 K/uL Final   Performed at [facility], [place] [address] [place], [place] [address]   I-stat hCG, quantitative 06/26/2021 <5.0  <5 mIU/mL Final   Comment 3 06/26/2021          Final   Comment:   GEST. AGE      CONC.  (mIU/mL)   <=1 WEEK        5 - 50     2  WEEKS       50 - 500     3 WEEKS       100 - 10,000     4 WEEKS     1,000 - 30,000        FEMALE AND NON-PREGNANT FEMALE:     LESS THAN 5 mIU/mL   Admission on 04/01/2021, Discharged on 04/01/2021  Component Date Value Ref Range Status   SARS Coronavirus 2 04/01/2021 NEGATIVE  NEGATIVE Final   Comment: (NOTE) SARS-CoV-2 target nucleic acids are NOT DETECTED.  The SARS-CoV-2 RNA is generally detectable in upper and lower respiratory specimens during the acute phase of infection. Negative results do not preclude SARS-CoV-2 infection, do not rule out co-infections with other pathogens, and should not be used as the sole basis for treatment or other patient management decisions. Negative results must be combined with clinical observations, patient history, and epidemiological information. The expected result is Negative.  Fact Sheet for Patients: SugarRoll.be  Fact Sheet for Healthcare Providers: https://www.woods-mathews.com/  This test is not yet approved or cleared by the [place] FDA and  has been authorized for detection and/or diagnosis of SARS-CoV-2 by FDA under an Emergency Use Authorization (EUA). This EUA will remain  in effect (meaning this test can be used) for the duration of the COVID-19 declaration under Se                          ction 564(b)(1) of the Act, 21 U.S.C. section 360bbb-3(b)(1), unless the authorization is terminated or revoked sooner.  Performed at [facility], [place] [address] [place], [place] [address]   Admission on 01/19/2021, Discharged on 01/21/2021  Component Date Value Ref Range Status   WBC 01/19/2021 10.3  4.0 - 10.5 K/uL Final   RBC 01/19/2021 5.15 (A) 3.87 - 5.11 MIL/uL Final   Hemoglobin 01/19/2021 11.6 (A) 12.0 - 15.0 g/dL Final   HCT 01/19/2021 38.3  36.0 - 46.0 % Final   MCV 01/19/2021 74.4 (A) 80.0 - 100.0 fL Final   MCH 01/19/2021 22.5 (A) 26.0 - 34.0 pg Final   MCHC 01/19/2021  30.3  30.0 - 36.0 g/dL Final   RDW 01/19/2021 17.1 (A) 11.5 - 15.5 % Final   Platelets 01/19/2021 336  150 - 400 K/uL Final   nRBC 01/19/2021 0.0  0.0 - 0.2 % Final   Performed at [facility], [place] [address] [place], [place] [address]   Sodium 01/19/2021 137  135 - 145 mmol/L Final   Potassium 01/19/2021 3.2 (A) 3.5 - 5.1 mmol/L Final   Chloride 01/19/2021 105  98 - 111 mmol/L Final   CO2 01/19/2021 21 (A) 22 - 32 mmol/L Final   Glucose, Bld 01/19/2021 116 (A) 70 - 99 mg/dL Final   Glucose reference range applies only to samples taken after fasting for at least 8 hours.   BUN 01/19/2021 10  6 - 20 mg/dL Final   Creatinine, Ser 01/19/2021 0.77  0.44 - 1.00 mg/dL Final   Calcium 01/19/2021 8.9  8.9 - 10.3 mg/dL Final   Total Protein 01/19/2021 8.0  6.5 - 8.1 g/dL Final   Albumin 01/19/2021 4.1  3.5 - 5.0 g/dL Final   AST 01/19/2021 14 (A) 15 - 41 U/L Final   ALT 01/19/2021 10  0 - 44 U/L Final   Alkaline Phosphatase 01/19/2021 53  38 - 126 U/L Final   Total Bilirubin 01/19/2021 0.7  0.3 - 1.2 mg/dL Final   GFR, Estimated 01/19/2021 >60  >60 mL/min Final   Comment: (NOTE) Calculated using the CKD-EPI Creatinine Equation (2021)    Anion gap 01/19/2021 11  5 - 15 Final   Performed at [facility], [place] [address] [place], [place] [address]   B Natriuretic Peptide 01/19/2021 20.8  0.0 - 100.0 pg/mL Final   Performed at [place] [address] [place], [place] [address]   SARS Coronavirus 2 by RT PCR 01/19/2021 NEGATIVE  NEGATIVE Final   Comment: (NOTE) SARS-CoV-2 target nucleic acids are NOT DETECTED.  The SARS-CoV-2 RNA is generally detectable in upper respiratory specimens during the acute phase of infection. The lowest concentration of SARS-CoV-2 viral copies this assay can detect is 138 copies/mL. A negative result does not preclude SARS-Cov-2 infection and should not be used as the sole basis for treatment or other  patient management decisions. A negative result may occur with  improper specimen collection/handling, submission of specimen other than nasopharyngeal swab, presence of viral mutation(s) within the areas targeted by this assay, and inadequate number of viral copies(<138 copies/mL). A negative result must be combined with clinical observations, patient history, and epidemiological information. The expected result is Negative.  Fact Sheet for Patients:  EntrepreneurPulse.com.au  Fact Sheet for Healthcare Providers:  IncredibleEmployment.be  This test is no                          t yet approved or cleared by the [place] FDA and  has been authorized for detection and/or diagnosis of SARS-CoV-2 by FDA under an Emergency Use Authorization (EUA). This EUA will remain  in effect (meaning this test can be used) for the duration of the COVID-19 declaration under Section 564(b)(1) of the Act, 21 U.S.C.section 360bbb-3(b)(1), unless the authorization is terminated  or revoked sooner.       Influenza A by PCR 01/19/2021 NEGATIVE  NEGATIVE Final  Influenza B by PCR 01/19/2021 NEGATIVE  NEGATIVE Final   Comment: (NOTE) The Xpert Xpress SARS-CoV-2/FLU/RSV plus assay is intended as an aid in the diagnosis of influenza from Nasopharyngeal swab specimens and should not be used as a sole basis for treatment. Nasal washings and aspirates are unacceptable for Xpert Xpress SARS-CoV-2/FLU/RSV testing.  Fact Sheet for Patients: EntrepreneurPulse.com.au  Fact Sheet for Healthcare Providers: IncredibleEmployment.be  This test is not yet approved or cleared by the [place] FDA and has been authorized for detection and/or diagnosis of SARS-CoV-2 by FDA under an Emergency Use Authorization (EUA). This EUA will remain in effect (meaning this test can be used) for the duration of the COVID-19 declaration under Section  564(b)(1) of the Act, 21 U.S.C. section 360bbb-3(b)(1), unless the authorization is terminated or revoked.  Performed at [facility], [place] [address], [place], [place] [address]    D-Dimer, America Brown 01/19/2021 >20.00 (A) 0.00 - 0.50 ug/mL-FEU Final   Comment: (NOTE) At the manufacturer cut-off value of 0.5 g/mL FEU, this assay has a negative predictive value of 95-100%.This assay is intended for use in conjunction with a clinical pretest probability (PTP) assessment model to exclude pulmonary embolism (PE) and deep venous thrombosis (DVT) in outpatients suspected of PE or DVT. Results should be correlated with clinical presentation. Performed at [facility], [place] [address], [place], [place] [address]    I-stat hCG, quantitative 01/19/2021 <5.0  <5 mIU/mL Final   Comment 3 01/19/2021          Final   Comment:   GEST. AGE      CONC.  (mIU/mL)   <=1 WEEK        5 - 50     2 WEEKS       50 - 500     3 WEEKS       100 - 10,000     4 WEEKS     1,000 - 30,000        FEMALE AND NON-PREGNANT FEMALE:     LESS THAN 5 mIU/mL    FIO2 01/19/2021 21.00   Final   pH, Ven 01/19/2021 7.326  7.250 - 7.430 Final   pCO2, Ven 01/19/2021 48.5  44.0 - 60.0 mmHg Final   pO2, Ven 01/19/2021 41.7  32.0 - 45.0 mmHg Final   Bicarbonate 01/19/2021 24.6  20.0 - 28.0 mmol/L Final   Acid-base deficit 01/19/2021 1.2  0.0 - 2.0 mmol/L Final   O2 Saturation 01/19/2021 69.4  % Final   Patient temperature 01/19/2021 98.6   Final   Performed at [facility], [place] [address] [place], [place] [address]   HIV Screen 4th Generation wRfx 01/20/2021 Non Reactive  Non Reactive Final   Performed at [facility], [place] [address] [place], [place] [address]   WBC 01/21/2021 11.7 (A) 4.0 - 10.5 K/uL Final   RBC 01/21/2021 4.37  3.87 - 5.11 MIL/uL Final   Hemoglobin 01/21/2021 9.6 (A) 12.0 - 15.0 g/dL Final   HCT 01/21/2021 32.7 (A) 36.0 - 46.0 % Final   MCV  01/21/2021 74.8 (A) 80.0 - 100.0 fL Final   MCH 01/21/2021 22.0 (A) 26.0 - 34.0 pg Final   MCHC 01/21/2021 29.4 (A) 30.0 - 36.0 g/dL Final   RDW 01/21/2021 17.0 (A) 11.5 - 15.5 % Final   Platelets 01/21/2021 421 (A) 150 - 400 K/uL Final   nRBC 01/21/2021 0.0  0.0 - 0.2 % Final   Performed at  [facility], [place] [address] [place], [place] [address]   Sodium 01/21/2021 138  135 - 145 mmol/L Final   Potassium 01/21/2021 3.9  3.5 - 5.1 mmol/L Final   Comment: DELTA CHECK NOTED NO VISIBLE HEMOLYSIS    Chloride 01/21/2021 103  98 - 111 mmol/L Final   CO2 01/21/2021 29  22 - 32 mmol/L Final   Glucose, Bld 01/21/2021 98  70 - 99 mg/dL Final   Glucose reference range applies only to samples taken after fasting for at least 8 hours.   BUN 01/21/2021 16  6 - 20 mg/dL Final   Creatinine, Ser 01/21/2021 0.73  0.44 - 1.00 mg/dL Final   Calcium 01/21/2021 9.2  8.9 - 10.3 mg/dL Final   Total Protein 01/21/2021 7.7  6.5 - 8.1 g/dL Final   Albumin 01/21/2021 3.7  3.5 - 5.0 g/dL Final   AST 01/21/2021 9 (A) 15 - 41 U/L Final   ALT 01/21/2021 8  0 - 44 U/L Final   Alkaline Phosphatase 01/21/2021 46  38 - 126 U/L Final   Total Bilirubin 01/21/2021 0.2 (A) 0.3 - 1.2 mg/dL Final   GFR, Estimated 01/21/2021 >60  >60 mL/min Final   Comment: (NOTE) Calculated using the CKD-EPI Creatinine Equation (2021)    Anion gap 01/21/2021 6  5 - 15 Final   Performed at [facility], [place] [address] [place], [place] [address]    Blood Alcohol level:  Lab Results  Component Value Date   ETH <10 06/26/2021   ETH <5 32/08/2481    Metabolic Disorder Labs: No results found for: HGBA1C, MPG No results found for: PROLACTIN No results found for: CHOL, TRIG, HDL, CHOLHDL, VLDL, LDLCALC  Therapeutic Lab Levels: No results found for: LITHIUM No results found for: VALPROATE No components found for:  CBMZ  Physical Findings   GAD-7    Flowsheet Row Counselor from 06/10/2021 in  [facility] Office Visit from 02/02/2018 in [place] for [facility] Routine Prenatal from 11/03/2017 in [place] for [facility] Routine Prenatal from 10/20/2017 in [place] for [facility] Routine Prenatal from 09/09/2017 in [place] for [facility]  Total GAD-7 Score 21 8 0 10 6      PHQ2-9    Flowsheet Row Counselor from 06/10/2021 in [facility] Office Visit from 02/02/2018 in [place] for [facility] Routine Prenatal from 11/03/2017 in [place] for [facility] Routine Prenatal from 10/20/2017 in [place] for [facility] Routine Prenatal from 09/09/2017 in [place] for [facility]  PHQ-2 Total Score 6 2 0 0 0  PHQ-9 Total Score 20 9 0 0 11      [place] ED from 06/27/2021 in [facility] ED from 06/26/2021 in [place] DEPT Counselor from 06/10/2021 in [place] CATEGORY High Risk Low Risk Low Risk        Musculoskeletal  Strength & Muscle Tone: within normal limits Gait & Station: normal Patient leans: N/A  Psychiatric Specialty Exam  Presentation  General Appearance: Appropriate for Environment  Eye Contact:Fair  Speech:Clear and Coherent  Speech Volume:Normal  Handedness:Right   Mood and Affect  Mood:Dysphoric  Affect:Congruent   Thought Process  Thought Processes:Coherent  Descriptions of Associations:Intact  Orientation:Full (Time, Place and Person)  Thought Content:Logical  Diagnosis of Schizophrenia or Schizoaffective disorder in past: Yes ("schizophrenic  delusional" per patient)  Hallucinations:Hallucinations: Visual; Auditory  Ideas of Reference:None  Suicidal Thoughts:Suicidal Thoughts: No  Homicidal Thoughts:Homicidal Thoughts: No   Sensorium   Memory:Immediate Fair; Recent Fair; Remote Fair  Judgment:Fair  Insight:Fair   Executive Functions  Concentration:Fair  Attention Span:Fair  [place]   Psychomotor Activity  Psychomotor Activity:Psychomotor Activity: Normal   Assets  Assets:Communication Skills; Desire for Improvement   Sleep  Sleep:Sleep: Fair Number of Hours of Sleep: 5   Physical Exam  Physical Exam Constitutional:      Appearance: Normal appearance.  HENT:     Head: Atraumatic.     Nose: Nose normal.  Eyes:     Conjunctiva/sclera: Conjunctivae normal.  Cardiovascular:     Rate and Rhythm: Normal rate.  Pulmonary:     Effort: Pulmonary effort is normal.  Musculoskeletal:     Cervical back: Normal range of motion.  Neurological:     Mental Status: She is alert and oriented to person, place, and time.   Review of Systems  Constitutional: Negative.   HENT: Negative.    Eyes: Negative.   Respiratory: Negative.    Cardiovascular: Negative.   Gastrointestinal: Negative.   Genitourinary: Negative.   Musculoskeletal: Negative.   Skin: Negative.   Neurological: Negative.   Endo/Heme/Allergies: Negative.   Psychiatric/Behavioral:  Positive for hallucinations.   Blood pressure (!) 141/91, pulse 78, temperature 97.8 F (36.6 C), resp. rate 16, last menstrual period 06/03/2021, SpO2 98 %. There is no height or weight on file to calculate BMI.  Treatment Plan Summary: PLAN OF CARE:  40 year old woman with history of schizoaffective disorder, GAD, who presented to [facility] yesterday and chief complaint suicidal thoughts.  She was assessed by TTS and psych NP who recommended that she be transferred to the [facility] after medical clearance in the ED.  Patient medically cleared in the ED after lab work and abdominal imaging. Patient was transferred to the [facility] 10/13  for further treatment, safety, stabilization.     Schizoaffective  disorder Anxiety PTSD -continue latuda 80 mg (changed to QHS) -continue prn vistaril -prazosin 2 mg qhs   HTN -continue norvasc 10 mg -continue of carvidolol 6.25 mg BID   Asthma -continue albuterol  RLS -will continue mirapex 0.25 mg for now. Could consider to d/c if appears to be contributing to psychosis (as is DA agonist-however, paranoia appears to be decreased today)     Abdominal pain -Bentyl 10mg  PO q6hr PRN for abdominal muscle cramps   Dispo: discharge to home.   [place]. Call.   Specialty: Behavioral Health Why: Please contact your therapist, Alvera Singh regarding a follow up appointment for outpatient therapy services. Contact information: [place] [phone]        Medicine, [facility]. Call.   Specialty: Family Medicine Why: Please arrange a follow up appointment with [name] for medication management services following discharge. Please be prepared to provide any discharge paperwork, including a list of medications precribed. Contact information: [place] [phone] [place] Follow up.   Specialty: Behavioral Health Why: A referral has been made on your behalf. Contact information: [place] [address] [phone]                 Marissa Calamity, NP 06/30/2021 8:34 AM

## 2021-06-30 NOTE — ED Notes (Signed)
Pt asking for ambulance said she is cramping too bad

## 2021-06-30 NOTE — Consult Note (Signed)
[facility] Discharge Suicide Risk Assessment   Principal Problem: Schizoaffective disorder [facility]) Discharge Diagnoses: Principal Problem:   Schizoaffective disorder ([place]) Active Problems:   PTSD (post-traumatic stress disorder)   Depression   MDD (major depressive disorder), recurrent severe, without psychosis ([place])   Total Time spent with patient: 15 minutes  Musculoskeletal: Strength & Muscle Tone: within normal limits Gait & Station: normal Patient leans: N/A  Psychiatric Specialty Exam  Presentation  General Appearance: Appropriate for Environment  Eye Contact:Fair  Speech:Clear and Coherent  Speech Volume:Normal  Handedness:Right   Mood and Affect  Mood:Dysphoric  Duration of Depression Symptoms: Greater than two weeks  Affect:Congruent   Thought Process  Thought Processes:Coherent  Descriptions of Associations:Intact  Orientation:Full (Time, Place and Person)  Thought Content:Logical  History of Schizophrenia/Schizoaffective disorder:Yes ("schizophrenic  delusional" per patient)  Duration of Psychotic Symptoms:-- (unk)  Hallucinations:Hallucinations: Visual; Auditory  Ideas of Reference:None  Suicidal Thoughts:Suicidal Thoughts: No  Homicidal Thoughts:Homicidal Thoughts: No   Sensorium  Memory:Immediate Fair; Recent Fair; Remote Fair  Judgment:Fair  Insight:Fair   Executive Functions  Concentration:Fair  Attention Span:Fair  [place]   Psychomotor Activity  Psychomotor Activity:Psychomotor Activity: Normal   Assets  Assets:Communication Skills; Desire for Improvement   Sleep  Sleep:Sleep: Fair Number of Hours of Sleep: 5   Physical Exam: Physical Exam Constitutional:      Appearance: Normal appearance.  HENT:     Head: Atraumatic.     Nose: Nose normal.  Eyes:     Conjunctiva/sclera: Conjunctivae normal.  Cardiovascular:     Rate and Rhythm: Normal rate.  Pulmonary:      Effort: Pulmonary effort is normal.  Musculoskeletal:        General: Normal range of motion.     Cervical back: Normal range of motion.  Neurological:     Mental Status: She is alert and oriented to person, place, and time.   Review of Systems  Constitutional: Negative.   HENT: Negative.    Eyes: Negative.   Respiratory: Negative.    Cardiovascular: Negative.   Genitourinary: Negative.   Musculoskeletal: Negative.   Skin: Negative.   Neurological: Negative.   Endo/Heme/Allergies: Negative.   Blood pressure (!) 141/91, pulse 78, temperature 97.8 F (36.6 C), resp. rate 16, last menstrual period 06/03/2021, SpO2 98 %. There is no height or weight on file to calculate BMI.  Mental Status Per Nursing Assessment::   On Admission:     Demographic Factors:  Low socioeconomic status  Loss Factors: Financial problems/change in socioeconomic status  Historical Factors: Impulsivity  Risk Reduction Factors:   Responsible for children under 16 years of age, Sense of responsibility to family, and Living with another person, especially a relative  Continued Clinical Symptoms:  Previous Psychiatric Diagnoses and Treatments  Cognitive Features That Contribute To Risk:  None    Suicide Risk:  Minimal: No identifiable suicidal ideation.  Patients presenting with no risk factors but with morbid ruminations; may be classified as minimal risk based on the severity of the depressive symptoms   Follow-up [place]. Call.   Specialty: Behavioral Health Why: Please contact your therapist, Alvera Singh regarding a follow up appointment for outpatient therapy services. Contact information: [place] [phone]        Medicine, [facility]. Call.   Specialty: Family Medicine Why: Please arrange a follow up appointment with [name] for medication management services  following discharge.  Please be prepared to provide any discharge paperwork, including a list of medications precribed. Contact information: [place] [phone] [place] Follow up.   Specialty: Behavioral Health Why: A referral has been made on your behalf. Contact information: [place] [phone]                Plan Of Care/Follow-up recommendations:  Activity:  as tolerated  Dason Mosley L, NP 06/30/2021, 1:11 PM

## 2021-06-30 NOTE — ED Notes (Signed)
Pt notified ED staff that she was leaving

## 2021-06-30 NOTE — Discharge Instructions (Addendum)

## 2021-06-30 NOTE — ED Notes (Signed)
Patient resting with no sxs of distress noted - will continue to monitor for safety 

## 2021-06-30 NOTE — ED Notes (Signed)
Patient is alert and verbal. Patient denies SI/HI and A/V/H. Patient requesting to be discharge home today.

## 2021-07-03 ENCOUNTER — Telehealth (HOSPITAL_COMMUNITY): Payer: Self-pay

## 2021-07-03 NOTE — BH Assessment (Signed)
Care Management - Follow Up [facility] Discharges   Writer attempted to make contact with patient today and was unsuccessful.  Writer left a HIPPA compliant voice message.   

## 2021-07-15 ENCOUNTER — Emergency Department (HOSPITAL_COMMUNITY)
Admission: EM | Admit: 2021-07-15 | Discharge: 2021-07-15 | Disposition: A | Discharge status: Home / Self Care | Payer: Medicaid Other | Attending: Emergency Medicine | Admitting: Emergency Medicine

## 2021-07-15 ENCOUNTER — Encounter (HOSPITAL_COMMUNITY): Payer: Self-pay | Admitting: *Deleted

## 2021-07-15 ENCOUNTER — Other Ambulatory Visit: Payer: Self-pay

## 2021-07-15 ENCOUNTER — Ambulatory Visit
Admission: EM | Admit: 2021-07-15 | Discharge: 2021-07-15 | Disposition: A | Discharge status: Home / Self Care | Payer: Medicaid Other

## 2021-07-15 ENCOUNTER — Encounter: Payer: Self-pay | Admitting: Emergency Medicine

## 2021-07-15 DIAGNOSIS — Z87891 Personal history of nicotine dependence: Secondary | ICD-10-CM | POA: Insufficient documentation

## 2021-07-15 DIAGNOSIS — R1011 Right upper quadrant pain: Secondary | ICD-10-CM | POA: Insufficient documentation

## 2021-07-15 DIAGNOSIS — I10 Essential (primary) hypertension: Secondary | ICD-10-CM | POA: Insufficient documentation

## 2021-07-15 DIAGNOSIS — J45909 Unspecified asthma, uncomplicated: Secondary | ICD-10-CM | POA: Insufficient documentation

## 2021-07-15 DIAGNOSIS — Z79899 Other long term (current) drug therapy: Secondary | ICD-10-CM | POA: Diagnosis not present

## 2021-07-15 DIAGNOSIS — R197 Diarrhea, unspecified: Secondary | ICD-10-CM | POA: Diagnosis not present

## 2021-07-15 DIAGNOSIS — R112 Nausea with vomiting, unspecified: Secondary | ICD-10-CM | POA: Insufficient documentation

## 2021-07-15 DIAGNOSIS — R1013 Epigastric pain: Secondary | ICD-10-CM | POA: Diagnosis present

## 2021-07-15 DIAGNOSIS — R109 Unspecified abdominal pain: Secondary | ICD-10-CM

## 2021-07-15 DIAGNOSIS — N9489 Other specified conditions associated with female genital organs and menstrual cycle: Secondary | ICD-10-CM | POA: Insufficient documentation

## 2021-07-15 LAB — CBC
HCT: 36.6 % (ref 36.0–46.0)
Hemoglobin: 10.6 g/dL — ABNORMAL LOW (ref 12.0–15.0)
MCH: 21.8 pg — ABNORMAL LOW (ref 26.0–34.0)
MCHC: 29 g/dL — ABNORMAL LOW (ref 30.0–36.0)
MCV: 75.3 fL — ABNORMAL LOW (ref 80.0–100.0)
Platelets: 418 10*3/uL — ABNORMAL HIGH (ref 150–400)
RBC: 4.86 MIL/uL (ref 3.87–5.11)
RDW: 17 % — ABNORMAL HIGH (ref 11.5–15.5)
WBC: 8.4 10*3/uL (ref 4.0–10.5)
nRBC: 0 % (ref 0.0–0.2)

## 2021-07-15 LAB — COMPREHENSIVE METABOLIC PANEL
ALT: 9 U/L (ref 0–44)
AST: 14 U/L — ABNORMAL LOW (ref 15–41)
Albumin: 3.7 g/dL (ref 3.5–5.0)
Alkaline Phosphatase: 44 U/L (ref 38–126)
Anion gap: 10 (ref 5–15)
BUN: 5 mg/dL — ABNORMAL LOW (ref 6–20)
CO2: 25 mmol/L (ref 22–32)
Calcium: 9.2 mg/dL (ref 8.9–10.3)
Chloride: 102 mmol/L (ref 98–111)
Creatinine, Ser: 0.68 mg/dL (ref 0.44–1.00)
GFR, Estimated: >60 mL/min (ref >60)
Glucose, Bld: 104 mg/dL — ABNORMAL HIGH (ref 70–99)
Potassium: 3.6 mmol/L (ref 3.5–5.1)
Sodium: 137 mmol/L (ref 135–145)
Total Bilirubin: 0.4 mg/dL (ref 0.3–1.2)
Total Protein: 7.5 g/dL (ref 6.5–8.1)

## 2021-07-15 LAB — I-STAT BETA HCG BLOOD, ED (MC, WL, AP ONLY): I-stat hCG, quantitative: <5 m[IU]/mL (ref <5)

## 2021-07-15 LAB — LIPASE, BLOOD: Lipase: 28 U/L (ref 11–51)

## 2021-07-15 MED ORDER — ONDANSETRON 4 MG PO TBDP
4.0000 mg | ORAL_TABLET | Freq: Once | ORAL | Status: AC
  Administered 2021-07-15: 4 mg via ORAL
  Filled 2021-07-15: qty 1

## 2021-07-15 MED ORDER — PANTOPRAZOLE SODIUM 20 MG PO TBEC: 20.0000 mg | DELAYED_RELEASE_TABLET | Freq: Every day | ORAL | 0 refills | Status: DC

## 2021-07-15 MED ORDER — ONDANSETRON HCL 4 MG PO TABS: 4.0000 mg | ORAL_TABLET | Freq: Three times a day (TID) | ORAL | 0 refills | Status: DC | PRN

## 2021-07-15 NOTE — ED Provider Notes (Signed)
[facility]    CSN: [id number] Arrival date & time: 07/15/21  0903      History   Chief Complaint Chief Complaint  Patient presents with   Nausea   Emesis   Abdominal Pain    HPI Susan Walton is a 40 y.o. female.   Patient presents with nausea, vomiting, diarrhea, severe abdominal pain that started a few weeks prior.  Patient rates pain 10/10 on pain scale and describes pain as "contractions".  Patient is not able to characterize pain in any other way.  Pain is constant.  No aggravating factors for pain.  Patient is having approximately 5 episodes of vomiting daily and 7 episodes of diarrhea daily.  Denies any blood in stool or emesis.  Denies chest pain or shortness of breath.  Denies any known fevers or sick contacts.  Patient also has been having difficulty swallowing that she attributes to "all the vomiting that she has been having".  She was recently seen at the ED on 06/26/2021.  Imaging of the abdomen revealed mild inflammation of the bile duct and abnormal LFTs.  No other abnormalities or worrisome etiologies were found, so patient was discharged to behavioral health.  Patient reports that pain has been continuous and seems to becoming more severe.   Emesis Abdominal Pain  Past Medical History:  Diagnosis Date   Anemia    Asthma attack 01/19/2021   Fibroid    Hypertension    Retinal detachment    Thyroid disease    Hypothyroidism    Patient Active Problem List   Diagnosis Date Noted   Depression 06/27/2021   MDD (major depressive disorder), recurrent severe, without psychosis ([place]) 06/27/2021   Schizoaffective disorder ([place]) 06/10/2021   PTSD (post-traumatic stress disorder) 06/10/2021   GAD (generalized anxiety disorder) 06/10/2021   Acute respiratory failure with hypoxia ([place]) 01/20/2021   Asthma attack 01/19/2021   Asthma 01/19/2021   Group B Streptococcus carrier, +RV culture, currently pregnant 12/10/2017   Gonorrhea affecting pregnancy in  first trimester 12/10/2017   Depression affecting pregnancy in third trimester, antepartum 10/20/2017   Pregnancy, supervision, high-risk 07/27/2017   Chronic hypertension with superimposed severe preeclampsia 07/27/2017   Drug abuse during pregnancy ([place]) 07/27/2017   Thyroid dysfunction, antepartum 07/27/2017   History of trichomoniasis 07/27/2017   Unwanted fertility 07/27/2017   Advanced maternal age in multigravida, second trimester    Adjustment disorder with depressed mood 06/05/2017    Past Surgical History:  Procedure Laterality Date   DILATION AND CURETTAGE OF UTERUS     EYE SURGERY     TUBAL LIGATION N/A 12/11/2017   Procedure: POST PARTUM TUBAL LIGATION;  Surgeon: [name], MD;  Location: [place];  Service: Gynecology;  Laterality: N/A;    OB History     Gravida  8   Para  5   Term  5   Preterm  0   AB  3   Living  5      SAB  2   IAB  1   Ectopic  0   Multiple  0   Live Births  5            Home Medications    Prior to Admission medications   Medication Sig Start Date End Date Taking? Authorizing Provider  acetaminophen (TYLENOL) 500 MG tablet Take 1,000 mg by mouth every 3 (three) hours as needed (for severe pain).    [provider]  albuterol (VENTOLIN HFA) 108 (90 Base)  MCG/ACT inhaler Inhale 1-2 puffs into the lungs every 6 (six) hours as needed for wheezing or shortness of breath. Patient not taking: Reported on 06/26/2021 11/13/20   Chase Picket, MD  amLODipine (NORVASC) 10 MG tablet Take 1 tablet (10 mg total) by mouth daily. 01/22/21   Little Ishikawa, MD  bismuth subsalicylate (PEPTO BISMOL) 262 MG/15ML suspension Take 30 mLs by mouth every 6 (six) hours as needed for indigestion.    [provider]  carvedilol (COREG) 6.25 MG tablet Take 1 tablet (6.25 mg total) by mouth 2 (two) times daily with a meal. Patient not taking: No sig reported 01/21/21   Little Ishikawa, MD  fluticasone  [facility]) 50 MCG/ACT nasal spray Place 2 sprays into both nostrils daily. 09/12/20   [provider]  hydrOXYzine (VISTARIL) 25 MG capsule Take 25 mg by mouth 3 (three) times daily as needed for anxiety.    [provider]  LATUDA 80 MG TABS tablet Take 80 mg by mouth daily with breakfast.    [provider]  prazosin (MINIPRESS) 2 MG capsule Take 2 mg by mouth at bedtime.    [provider]  PROAIR HFA 108 [phone] Base) MCG/ACT inhaler Inhale 2 puffs into the lungs every 2 (two) hours as needed for shortness of breath.    [provider]  sodium chloride (OCEAN) 0.65 % SOLN nasal spray Place 1 spray into both nostrils as needed for congestion. 04/01/21   Charlesetta Shanks, MD  traZODone (DESYREL) 100 MG tablet Take 100 mg by mouth at bedtime. Patient not taking: No sig reported 11/07/20   [provider]  labetalol (NORMODYNE) 100 MG tablet Take 1 tablet (100 mg total) by mouth 2 (two) times daily. 05/24/19 11/13/20  Jaynee Eagles, PA-C  NIFEdipine (PROCARDIA-XL/ADALAT CC) 60 MG 24 hr tablet Take 1 tablet (60 mg total) by mouth 2 (two) times daily. Patient not taking: Reported on 03/28/2019 12/13/17 05/24/19  Osborne Oman, MD  omeprazole (PRILOSEC) 40 MG capsule Take 1 capsule (40 mg total) by mouth daily. 30 minutes before breakfast Patient not taking: Reported on 03/28/2019 05/12/18 05/24/19  Mauri Pole, MD    Family History Family History  Problem Relation Age of Onset   COPD Maternal Grandfather    Liver disease Paternal Grandmother    Liver disease Paternal Grandfather     Social History Social History   Tobacco Use   Smoking status: Former    Packs/day: 0.50    Types: Cigarettes   Smokeless tobacco: Never  Vaping Use   Vaping Use: Never used  Substance Use Topics   Alcohol use: Not Currently   Drug use: Not Currently     Allergies   Other   Review of Systems Review of Systems Per HPI  Physical Exam Triage Vital  Signs ED Triage Vitals [07/15/21 1018]  Enc Vitals Group     BP (!) 165/126     Pulse Rate 87     Resp 16     Temp 98.6 F (37 C)     Temp Source Oral     SpO2 97 %     Weight      Height      Head Circumference      Peak Flow      Pain Score      Pain Loc      Pain Edu?      Excl. in [place]?    No data found.  Updated Vital  Signs BP (!) 165/126 (BP Location: Left Arm)   Pulse 87   Temp 98.6 F (37 C) (Oral)   Resp 16   SpO2 97%   Visual Acuity Right Eye Distance:   Left Eye Distance:   Bilateral Distance:    Right Eye Near:   Left Eye Near:    Bilateral Near:     Physical Exam Constitutional:      General: She is not in acute distress.    Appearance: Normal appearance. She is not toxic-appearing or diaphoretic.  HENT:     Head: Normocephalic and atraumatic.     Mouth/Throat:     Mouth: Mucous membranes are moist.     Pharynx: Oropharynx is clear. No posterior oropharyngeal erythema.  Eyes:     Extraocular Movements: Extraocular movements intact.     Conjunctiva/sclera: Conjunctivae normal.  Cardiovascular:     Rate and Rhythm: Normal rate and regular rhythm.     Pulses: Normal pulses.     Heart sounds: Normal heart sounds.  Pulmonary:     Effort: Pulmonary effort is normal. No respiratory distress.     Breath sounds: Normal breath sounds.  Abdominal:     General: Bowel sounds are normal. There is no distension.     Palpations: Abdomen is soft.     Tenderness: There is abdominal tenderness in the right upper quadrant and right lower quadrant. There is no right CVA tenderness, left CVA tenderness, guarding or rebound. Negative signs include Murphy's sign, Rovsing's sign, McBurney's sign, psoas sign and obturator sign.  Musculoskeletal:        General: Normal range of motion.  Skin:    General: Skin is warm and dry.  Neurological:     General: No focal deficit present.     Mental Status: She is alert and oriented to person, place, and time. Mental status  is at baseline.  Psychiatric:        Mood and Affect: Mood normal.        Behavior: Behavior normal.        Thought Content: Thought content normal.        Judgment: Judgment normal.     UC Treatments / Results  Labs (all labs ordered are listed, but only abnormal results are displayed) Labs Reviewed - No data to display  EKG   Radiology No results found.  Procedures Procedures (including critical care time)  Medications Ordered in UC Medications - No data to display  Initial Impression / Assessment and Plan / UC Course  I have reviewed the triage vital signs and the nursing notes.  Pertinent labs & imaging results that were available during my care of the patient were reviewed by me and considered in my medical decision making (see chart for details).     Due to patient having severe abdominal pain, patient was advised to go to the hospital for further evaluation management.  Patient was agreeable plan.  Vital signs stable at discharge.  Agree with patient self transport to the hospital. Final Clinical Impressions(s) / UC Diagnoses   Final diagnoses:  Continuous severe abdominal pain     Discharge Instructions      Please go the hospital as soon as you leave urgent care for further evaluation and management.     ED Prescriptions   None    PDMP not reviewed this encounter.   Teodora Medici, [place] 07/15/21 1113

## 2021-07-15 NOTE — Discharge Instructions (Addendum)
Your symptoms could be due to heartburn.  Take medication prescribed.  However, it is important for you to call and follow-up closely with a gastroenterologist for further assessments of your gallbladder as this could be related to your gallbladder as well.  Eat bland food in the meantime, avoid any alcohol or anti-inflammatory medication as it can aggravate your discomfort.

## 2021-07-15 NOTE — ED Triage Notes (Addendum)
Nausea, vomiting, diarrhea, with right and left upper abdominal pain described as a contraction.   Says she had ultrasounds of pancrease and kidneys recently for this.   States she has been having problems swallowing with all the vomiting she's been doing. Reports she feels like she may have a pill stuck in her throat.

## 2021-07-15 NOTE — Discharge Instructions (Signed)
Please go the hospital as soon as you leave urgent care for further evaluation and management.

## 2021-07-15 NOTE — ED Provider Notes (Signed)
[facility] EMERGENCY DEPARTMENT Provider Note   CSN: [id number] Arrival date & time: 07/15/21  1214     History Chief Complaint  Patient presents with   Abdominal Pain    Susan Walton is a 40 y.o. female.  The history is provided by the patient and medical records. No language interpreter was used.  Abdominal Pain  40 year old female significant history of schizophrenia, hypertension, uterine fibroid, asthma who presents for evaluation of abdominal pain.  Patient report for the past month she has had recurrent right upper quadrant and epigastric abdominal pain.  She described pain as a crampy uncomfortable sensation with indigestion as well as having bouts of nausea vomiting diarrhea.  Vomitus is bilious and sometimes she spit up phlegm.  She endorsed postprandial pain and is afraid to eat.  She is drinking plenty of fluid.  Pain has been waxing and waning and patient decided not to eat anything today therefore her pain is well controlled at this time.  As she mention been seen evaluated for this condition several weeks ago and had an ultrasound and CT scan.  She was told that her "tube" is enlarged.  As she is here today requesting to be treated.  No history of diabetes.  At this time pain is minimal.  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/27/2021   Schizoaffective disorder ([place]) 06/10/2021   PTSD (post-traumatic stress disorder) 06/10/2021   GAD (generalized anxiety disorder) 06/10/2021   Acute respiratory failure with hypoxia ([place]) 01/20/2021   Asthma attack 01/19/2021   Asthma 01/19/2021   Group B Streptococcus carrier, +RV culture, currently pregnant 12/10/2017   Gonorrhea affecting pregnancy in first trimester  12/10/2017   Depression affecting pregnancy in third trimester, antepartum 10/20/2017   Pregnancy, supervision, high-risk 07/27/2017   Chronic hypertension with superimposed severe preeclampsia 07/27/2017   Drug abuse during pregnancy ([place]) 07/27/2017   Thyroid dysfunction, antepartum 07/27/2017   History of trichomoniasis 07/27/2017   Unwanted fertility 07/27/2017   Advanced maternal age in multigravida, second trimester    Adjustment disorder with depressed mood 06/05/2017    Past Surgical History:  Procedure Laterality Date   DILATION AND CURETTAGE OF UTERUS     EYE SURGERY     TUBAL LIGATION N/A 12/11/2017   Procedure: POST PARTUM TUBAL LIGATION;  Surgeon: [name], MD;  Location: [facility];  Service: Gynecology;  Laterality: N/A;     OB History     Gravida  8   Para  5   Term  5   Preterm  0   AB  3   Living  5      SAB  2   IAB  1   Ectopic  0   Multiple  0   Live Births  5           Family History  Problem Relation Age of Onset   COPD Maternal Grandfather    Liver disease Paternal Grandmother    Liver disease Paternal Grandfather     Social History   Tobacco Use   Smoking status: Former    Packs/day: 0.50    Types: Cigarettes   Smokeless tobacco: Never  Vaping Use   Vaping Use:  Never used  Substance Use Topics   Alcohol use: Yes   Drug use: Not Currently    Home Medications Prior to Admission medications   Medication Sig Start Date End Date Taking? Authorizing Provider  acetaminophen (TYLENOL) 500 MG tablet Take 1,000 mg by mouth every 3 (three) hours as needed (for severe pain).    [provider]  albuterol (VENTOLIN HFA) 108 (90 Base) MCG/ACT inhaler Inhale 1-2 puffs into the lungs every 6 (six) hours as needed for wheezing or shortness of breath. Patient not taking: Reported on 06/26/2021 11/13/20   Chase Picket, MD  amLODipine (NORVASC) 10 MG tablet Take 1 tablet (10 mg total) by mouth daily. 01/22/21    Little Ishikawa, MD  bismuth subsalicylate (PEPTO BISMOL) 262 MG/15ML suspension Take 30 mLs by mouth every 6 (six) hours as needed for indigestion.    [provider]  carvedilol (COREG) 6.25 MG tablet Take 1 tablet (6.25 mg total) by mouth 2 (two) times daily with a meal. Patient not taking: No sig reported 01/21/21   Little Ishikawa, MD  fluticasone [facility]) 50 MCG/ACT nasal spray Place 2 sprays into both nostrils daily. 09/12/20   [provider]  hydrOXYzine (VISTARIL) 25 MG capsule Take 25 mg by mouth 3 (three) times daily as needed for anxiety.    [provider]  LATUDA 80 MG TABS tablet Take 80 mg by mouth daily with breakfast.    [provider]  prazosin (MINIPRESS) 2 MG capsule Take 2 mg by mouth at bedtime.    [provider]  PROAIR HFA [phone] Base) MCG/ACT inhaler Inhale 2 puffs into the lungs every 2 (two) hours as needed for shortness of breath.    [provider]  sodium chloride (OCEAN) 0.65 % SOLN nasal spray Place 1 spray into both nostrils as needed for congestion. 04/01/21   Charlesetta Shanks, MD  traZODone (DESYREL) 100 MG tablet Take 100 mg by mouth at bedtime. Patient not taking: No sig reported 11/07/20   [provider]  labetalol (NORMODYNE) 100 MG tablet Take 1 tablet (100 mg total) by mouth 2 (two) times daily. 05/24/19 11/13/20  Jaynee Eagles, PA-C  NIFEdipine (PROCARDIA-XL/ADALAT CC) 60 MG 24 hr tablet Take 1 tablet (60 mg total) by mouth 2 (two) times daily. Patient not taking: Reported on 03/28/2019 12/13/17 05/24/19  Osborne Oman, MD  omeprazole (PRILOSEC) 40 MG capsule Take 1 capsule (40 mg total) by mouth daily. 30 minutes before breakfast Patient not taking: Reported on 03/28/2019 05/12/18 05/24/19  Mauri Pole, MD    Allergies    Other  Review of Systems   Review of Systems  Gastrointestinal:  Positive for abdominal pain.  All other systems reviewed and are negative.  Physical  Exam Updated Vital Signs BP (!) 148/101   Pulse 75   Temp 98.7 F (37.1 C)   Resp 16   Ht 5\' 7"  (1.702 m)   Wt 81.6 kg   SpO2 100%   BMI 28.19 kg/m   Physical Exam Vitals and nursing note reviewed.  Constitutional:      General: She is not in acute distress.    Appearance: She is well-developed.  HENT:     Head: Atraumatic.  Eyes:     Conjunctiva/sclera: Conjunctivae normal.  Cardiovascular:     Rate and Rhythm: Normal rate and regular rhythm.  Pulmonary:     Effort: Pulmonary effort is normal.  Abdominal:     General: Abdomen is  flat.     Palpations: Abdomen is soft.     Tenderness: There is abdominal tenderness (Very mild epigastric tenderness.  Negative Murphy sign, no pain at McBurney's point.).  Musculoskeletal:     Cervical back: Neck supple.  Skin:    Findings: No rash.  Neurological:     Mental Status: She is alert.  Psychiatric:        Mood and Affect: Mood normal.    ED Results / Procedures / Treatments   Labs (all labs ordered are listed, but only abnormal results are displayed) Labs Reviewed  COMPREHENSIVE METABOLIC PANEL - Abnormal; Notable for the following components:      Result Value   Glucose, Bld 104 (*)    BUN 5 (*)    AST 14 (*)    All other components within normal limits  CBC - Abnormal; Notable for the following components:   Hemoglobin 10.6 (*)    MCV 75.3 (*)    MCH 21.8 (*)    MCHC 29.0 (*)    RDW 17.0 (*)    Platelets 418 (*)    All other components within normal limits  LIPASE, BLOOD  URINALYSIS, ROUTINE W REFLEX MICROSCOPIC  I-STAT BETA HCG BLOOD, ED ([facility], [facility], [facility] ONLY)    EKG None  Radiology No results found.  Procedures Procedures   Medications Ordered in ED Medications  ondansetron (ZOFRAN-ODT) disintegrating tablet 4 mg (4 mg Oral Given 07/15/21 1307)    ED Course  I have reviewed the triage vital signs and the nursing notes.  Pertinent labs & imaging results that were available during my care of the  patient were reviewed by me and considered in my medical decision making (see chart for details).    MDM Rules/Calculators/A&P                           BP (!) 169/114   Pulse 86   Temp 98.7 F (37.1 C)   Resp 16   Ht 5\' 7"  (1.702 m)   Wt 81.6 kg   SpO2 100%   BMI 28.19 kg/m  The patient was noted to be hypertensive today in the emergency department. I have spoken with the patient regarding hypertension and the need for improved management. I instructed the patient to followup with the Primary care doctor within 4 days to improve the management of the patient's hypertension. I also counseled the patient regarding the signs and symptoms which would require an emergent visit to an emergency department for hypertensive urgency and/or hypertensive emergency. The patient understood the need for improved hypertensive management.  Final Clinical Impression(s) / ED Diagnoses Final diagnoses:  Epigastric pain    Rx / DC Orders ED Discharge Orders          Ordered    pantoprazole (PROTONIX) 20 MG tablet  Daily        07/15/21 2223    ondansetron (ZOFRAN) 4 MG tablet  Every 8 hours PRN        07/15/21 2223           9:55 PM Patient here with recurrent upper abdominal pain specifically postprandial pain for the past month.  Also endorsing nausea vomiting diarrhea associate with it.  Has been seen on 10/13 for her complaint.  At that time abnormal pelvis CT scan obtained showing lower esophageal wall thickening which may indicate reflux disease or esophagitis.  A limited abdominal ultrasound was obtained showing minimal enlargement of  the common bile duct measuring up to 7 mm without any obstructive lesion and no intra hepatic biliary dilatation and no evidence of acute cholecystitis.  Today her labs are reassuring, low suspicion for acute cholecystitis on exam.  Patient likely benefit from outpatient evaluation by GI as she would need additional evaluation to identify the cause.  Will  discharge home with PPI, recommend eating bland food, antinausea medication, and return precaution.  Care discussed with Dr. Alvino Chapel.    Domenic Moras, PA-C 07/15/21 2225    Davonna Belling, MD 07/16/21 [phone]

## 2021-07-15 NOTE — ED Provider Notes (Signed)
Emergency Medicine Provider Triage Evaluation Note  Susan Walton , a 40 y.o. female  was evaluated in triage.  Pt complains of abdominal pain x30 days.  She is describing her pain as upper abdominal cramping, no aggravating relieving factors.  Notes that she is having about 5 episodes of nonbilious nonbloody emesis, and about 6 episodes of diarrhea per day.  She was seen in urgent care and told to present to the emergency department for further evaluation.  She has been trying Tylenol without relief of her pain.  She also complaining of sore throat after vomiting.  Review of Systems  Positive: Abdominal pain, nausea, vomiting, diarrhea, chills, sore throat Negative: Chest pain, shortness of breath, dysuria  Physical Exam  BP (!) 151/107 (BP Location: Right Arm)   Pulse 72   Temp 98.7 F (37.1 C)   Resp 18   Ht 5\' 7"  (1.702 m)   Wt 81.6 kg   SpO2 97%   BMI 28.19 kg/m  Gen:   Awake, no distress   Resp:  Normal effort  MSK:   Moves extremities without difficulty  Other:  Abdomen soft, nondistended, epigastrium tenderness to palpation, no guarding or rigidity  Medical Decision Making  Medically screening exam initiated at 1:02 PM.  Appropriate orders placed.  Charolette Bultman was informed that the remainder of the evaluation will be completed by another provider, this initial triage assessment does not replace that evaluation, and the importance of remaining in the ED until their evaluation is complete.     Kateri Plummer, PA-C 07/15/21 1303    Lorelle Gibbs, DO 07/16/21 9518

## 2021-07-15 NOTE — ED Triage Notes (Signed)
C/o abd. Pain with abd. Pain with vomiting and diarrhea x 30 days no different today , states she was seen at [facility] and told to come to ed for further eval.

## 2021-07-16 ENCOUNTER — Encounter: Payer: Self-pay | Admitting: Physician Assistant

## 2021-07-16 ENCOUNTER — Ambulatory Visit (INDEPENDENT_AMBULATORY_CARE_PROVIDER_SITE_OTHER): Payer: Medicaid Other | Admitting: Licensed Clinical Social Worker

## 2021-07-16 DIAGNOSIS — F411 Generalized anxiety disorder: Secondary | ICD-10-CM

## 2021-07-16 DIAGNOSIS — F259 Schizoaffective disorder, unspecified: Secondary | ICD-10-CM

## 2021-07-16 DIAGNOSIS — F4310 Post-traumatic stress disorder, unspecified: Secondary | ICD-10-CM

## 2021-07-16 NOTE — Progress Notes (Signed)
   THERAPIST PROGRESS NOTE  Session Time: 76  Virtual Visit via Video Note  I connected with Andrey Spearman on 07/16/21 at  2:00 PM EDT by a video enabled telemedicine application and verified that I am speaking with the correct person using two identifiers.  Location: Patient: [facility]  Provider: [facility]   I discussed the limitations of evaluation and management by telemedicine and the availability of in person appointments. The patient expressed understanding and agreed to proceed.  I discussed the assessment and treatment plan with the patient. The patient was provided an opportunity to ask questions and all were answered. The patient agreed with the plan and demonstrated an understanding of the instructions.   The patient was advised to call back or seek an in-person evaluation if the symptoms worsen or if the condition fails to improve as anticipated.  I provided 40 minutes of non-face-to-face time during this encounter.   Dory Horn, LCSW  Participation Level: Active  Behavioral Response: CasualAlertAnxious and Depressed  Type of Therapy: Individual Therapy  Treatment Goals addressed: Diagnosis: schizoaffective disorder   Interventions: CBT, Solution Focused, and Supportive  Summary: Elexus Barman is a 40 y.o. female who presents with depressed and anxious mood\affect.  Patient was alert and oriented x5.  She was pleasant, cooperative, and maintained good eye contact.  Patient's primary stressors for today are illness, housing, and financials.  Patient reports that she has been dealing with the severe illness for the past month.  She reports that she has been to the hospital multiple times because of it with no conclusion on what is going on with her.  Patient reports they have referred her to a gastro intestinal doctor and patient will be following up with them in the next 2 weeks.  Patient states that she has paranoia that her  daughter is poisoning her because "I will not allow her to go to bed whenever she wants".  Patient also reports auditory hallucinations for negative thoughts such as hurting herself.  Patient currently denies any suicidal or homicidal ideations.  LCSW did administer and go over a crisis intervention plan which has been documented in chart.  Other stressors for patient are housing and financials.  She has not been able to reach out for further financial assistance for her rent due to her illness.  Patient reports that she is being evicted from her apartment and she will be moving in with one of her best friends Venora Maples.  Patient states that she will be moving in with herself and her 2 daughters.  LCSW did advise patient to speak with [name] about medication management as patient continues to have increased paranoia and auditory hallucinations, patient was agreeable to plan.  Suicidal/Homicidal: NAwithout intent/plan  Therapist Response:    Intervention/Plan: Interventions utilized in today's session were supportive, cognitive behavioral therapy, and person centered therapy.  LCSW educated patient on signs and symptoms of depression and crisis management.  This was as evidenced by going over crisis intervention plan with the patient and also advising patient to journal symptoms such as paranoia and auditory hallucinations for frequency and duration throughout her day.  LCSW utilized language for genuineness, empowerment, encouragement, and positive regard.  Plan for patient is to follow-up with [name] for proper medication management and adjustments needed.  Plan: Return again in 4 weeks.     Dory Horn, LCSW 07/16/2021

## 2021-08-01 ENCOUNTER — Encounter (INDEPENDENT_AMBULATORY_CARE_PROVIDER_SITE_OTHER): Payer: Self-pay

## 2021-08-01 ENCOUNTER — Other Ambulatory Visit (INDEPENDENT_AMBULATORY_CARE_PROVIDER_SITE_OTHER): Payer: Medicaid Other

## 2021-08-01 ENCOUNTER — Encounter: Payer: Self-pay | Admitting: Physician Assistant

## 2021-08-01 ENCOUNTER — Ambulatory Visit (INDEPENDENT_AMBULATORY_CARE_PROVIDER_SITE_OTHER): Payer: Medicaid Other | Admitting: Physician Assistant

## 2021-08-01 VITALS — BP 130/94 | HR 96 | Ht 67.5 in | Wt 156.1 lb

## 2021-08-01 DIAGNOSIS — R1013 Epigastric pain: Secondary | ICD-10-CM

## 2021-08-01 DIAGNOSIS — R1310 Dysphagia, unspecified: Secondary | ICD-10-CM | POA: Diagnosis not present

## 2021-08-01 DIAGNOSIS — R634 Abnormal weight loss: Secondary | ICD-10-CM

## 2021-08-01 DIAGNOSIS — R112 Nausea with vomiting, unspecified: Secondary | ICD-10-CM | POA: Diagnosis not present

## 2021-08-01 LAB — CBC WITH DIFFERENTIAL/PLATELET
Basophils Absolute: 0.1 10*3/uL (ref 0.0–0.1)
Basophils Relative: 1.2 % (ref 0.0–3.0)
Eosinophils Absolute: 1.4 10*3/uL — ABNORMAL HIGH (ref 0.0–0.7)
Eosinophils Relative: 21.1 % — ABNORMAL HIGH (ref 0.0–5.0)
HCT: 35.2 % — ABNORMAL LOW (ref 36.0–46.0)
Hemoglobin: 10.8 g/dL — ABNORMAL LOW (ref 12.0–15.0)
Lymphocytes Relative: 32.7 % (ref 12.0–46.0)
Lymphs Abs: 2.1 10*3/uL (ref 0.7–4.0)
MCHC: 30.6 g/dL (ref 30.0–36.0)
MCV: 71.8 fl — ABNORMAL LOW (ref 78.0–100.0)
Monocytes Absolute: 0.4 10*3/uL (ref 0.1–1.0)
Monocytes Relative: 5.9 % (ref 3.0–12.0)
Neutro Abs: 2.5 10*3/uL (ref 1.4–7.7)
Neutrophils Relative %: 39.1 % — ABNORMAL LOW (ref 43.0–77.0)
Platelets: 372 10*3/uL (ref 150.0–400.0)
RBC: 4.9 Mil/uL (ref 3.87–5.11)
RDW: 17.6 % — ABNORMAL HIGH (ref 11.5–15.5)
WBC: 6.5 10*3/uL (ref 4.0–10.5)

## 2021-08-01 LAB — IBC PANEL
Iron: 13 ug/dL — ABNORMAL LOW (ref 42–145)
Saturation Ratios: 2.4 % — ABNORMAL LOW (ref 20.0–50.0)
TIBC: 548.8 ug/dL — ABNORMAL HIGH (ref 250.0–450.0)
Transferrin: 392 mg/dL — ABNORMAL HIGH (ref 212.0–360.0)

## 2021-08-01 LAB — FERRITIN: Ferritin: 14 ng/mL (ref 10.0–291.0)

## 2021-08-01 LAB — COMPREHENSIVE METABOLIC PANEL
ALT: 7 U/L (ref 0–35)
AST: 10 U/L (ref 0–37)
Albumin: 4.3 g/dL (ref 3.5–5.2)
Alkaline Phosphatase: 41 U/L (ref 39–117)
BUN: 11 mg/dL (ref 6–23)
CO2: 28 mEq/L (ref 19–32)
Calcium: 9.3 mg/dL (ref 8.4–10.5)
Chloride: 104 mEq/L (ref 96–112)
Creatinine, Ser: 0.76 mg/dL (ref 0.40–1.20)
GFR: 97.87 mL/min (ref >60.00)
Glucose, Bld: 89 mg/dL (ref 70–99)
Potassium: 3.6 mEq/L (ref 3.5–5.1)
Sodium: 140 mEq/L (ref 135–145)
Total Bilirubin: 0.4 mg/dL (ref 0.2–1.2)
Total Protein: 7.7 g/dL (ref 6.0–8.3)

## 2021-08-01 LAB — TSH: TSH: 0.23 u[IU]/mL — ABNORMAL LOW (ref 0.35–5.50)

## 2021-08-01 LAB — SEDIMENTATION RATE: Sed Rate: 37 mm/hr — ABNORMAL HIGH (ref 0–20)

## 2021-08-01 MED ORDER — DICYCLOMINE HCL 10 MG PO CAPS: 10.0000 mg | ORAL_CAPSULE | Freq: Three times a day (TID) | ORAL | 1 refills | Status: DC | PRN

## 2021-08-01 MED ORDER — ONDANSETRON HCL 4 MG PO TABS: 4.0000 mg | ORAL_TABLET | Freq: Three times a day (TID) | ORAL | 2 refills | Status: DC | PRN
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
## 2021-08-01 NOTE — Patient Instructions (Addendum)
If you are age 40 or younger, your body mass index should be between 19-25. Your Body mass index is 24.09 kg/m. If this is out of the aformentioned range listed, please consider follow up with your Primary Care Provider.  ________________________________________________________  The [facility] providers would like to encourage you to use [facility] to communicate with providers for non-urgent requests or questions.  Due to long hold times on the telephone, sending your provider a message by [facility] may be a faster and more efficient way to get a response.  Please allow 48 business hours for a response.  Please remember that this is for non-urgent requests.  _______________________________________________________  Dennis Bast have been scheduled for an endoscopy. Please follow written instructions given to you at your visit today. If you use inhalers (even only as needed), please bring them with you on the day of your procedure.  Pick up the Pantoprazole 40 mg 1 tablet every morning  Continue Ondansetron 4 mg 1 tablet every 6-8 hours as needed for nausea   START Dicyclomine 10 mg 1 tablet three times daily as needed for abdominal pain/cramping.  Stop Marijuana use. Push fluids Follow a bland diet.  Follow up pending the results of your labs and Endoscopy.  Thank you for entrusting me with your care and choosing [facility].  Nicoletta Ba, AP-C

## 2021-08-04 NOTE — Progress Notes (Signed)
I agree with the assessment and plan as outlined by Ms. Bushyhead. Recommend starting her on iron supplements as well based on the labs that just came back.

## 2021-08-07 ENCOUNTER — Ambulatory Visit (AMBULATORY_SURGERY_CENTER): Payer: Medicaid Other | Admitting: Internal Medicine

## 2021-08-07 ENCOUNTER — Encounter: Payer: Self-pay | Admitting: Internal Medicine

## 2021-08-07 ENCOUNTER — Other Ambulatory Visit: Payer: Self-pay

## 2021-08-07 VITALS — BP 103/42 | HR 105 | Temp 97.5°F | Resp 15 | Ht 67.0 in | Wt 156.0 lb

## 2021-08-07 DIAGNOSIS — K2970 Gastritis, unspecified, without bleeding: Secondary | ICD-10-CM | POA: Diagnosis not present

## 2021-08-07 DIAGNOSIS — R1310 Dysphagia, unspecified: Secondary | ICD-10-CM

## 2021-08-07 DIAGNOSIS — R1013 Epigastric pain: Secondary | ICD-10-CM | POA: Diagnosis not present

## 2021-08-07 DIAGNOSIS — R634 Abnormal weight loss: Secondary | ICD-10-CM

## 2021-08-07 DIAGNOSIS — K319 Disease of stomach and duodenum, unspecified: Secondary | ICD-10-CM | POA: Diagnosis not present

## 2021-08-07 DIAGNOSIS — K2950 Unspecified chronic gastritis without bleeding: Secondary | ICD-10-CM | POA: Diagnosis not present

## 2021-08-07 DIAGNOSIS — B9681 Helicobacter pylori [H. pylori] as the cause of diseases classified elsewhere: Secondary | ICD-10-CM | POA: Diagnosis not present

## 2021-08-07 MED ORDER — SODIUM CHLORIDE 0.9 % IV SOLN
500.0000 mL | INTRAVENOUS | Status: DC
Start: 2021-08-07 — End: 2021-08-07

## 2021-08-07 MED ORDER — CAPSAICIN 0.075 % EX CREA: TOPICAL_CREAM | Freq: Four times a day (QID) | CUTANEOUS | Status: AC

## 2021-08-07 NOTE — Progress Notes (Signed)
Called to room to assist during endoscopic procedure.  Patient ID and intended procedure confirmed with present staff. Received instructions for my participation in the procedure from the performing physician.  

## 2021-08-07 NOTE — Patient Instructions (Signed)
Thank you for allowing [place] to take care of your healthcare needs today.  Please return for your follow up appointment on October 07, 2021 at 1030.  YOU HAD AN ENDOSCOPIC PROCEDURE TODAY AT [facility]:   Refer to the procedure report that was given to you for any specific questions about what was found during the examination.  If the procedure report does not answer your questions, please call your gastroenterologist to clarify.  If you requested that your care partner not be given the details of your procedure findings, then the procedure report has been included in a sealed envelope for you to review at your convenience later.  YOU SHOULD EXPECT: Some feelings of bloating in the abdomen. Passage of more gas than usual.  Walking can help get rid of the air that was put into your GI tract during the procedure and reduce the bloating. If you had a lower endoscopy (such as a colonoscopy or flexible sigmoidoscopy) you may notice spotting of blood in your stool or on the toilet paper. If you underwent a bowel prep for your procedure, you may not have a normal bowel movement for a few days.  Please Note:  You might notice some irritation and congestion in your nose or some drainage.  This is from the oxygen used during your procedure.  There is no need for concern and it should clear up in a day or so.  SYMPTOMS TO REPORT IMMEDIATELY:  Following upper endoscopy (EGD)  Vomiting of blood or coffee ground material  New chest pain or pain under the shoulder blades  Painful or persistently difficult swallowing  New shortness of breath  Fever of 100F or higher  Black, tarry-looking stools  For urgent or emergent issues, a gastroenterologist can be reached at any hour by calling [phone]. Do not use MyChart messaging for urgent concerns.    DIET:  We do recommend a small meal at first, but then you may proceed to your regular diet.  Drink plenty of fluids but you should avoid  alcoholic beverages for 24 hours.  ACTIVITY:  You should plan to take it easy for the rest of today and you should NOT DRIVE or use heavy machinery until tomorrow (because of the sedation medicines used during the test).    FOLLOW UP: Our staff will call the number listed on your records 48-72 hours following your procedure to check on you and address any questions or concerns that you may have regarding the information given to you following your procedure. If we do not reach you, we will leave a message.  We will attempt to reach you two times.  During this call, we will ask if you have developed any symptoms of COVID 19. If you develop any symptoms (ie: fever, flu-like symptoms, shortness of breath, cough etc.) before then, please call [phone].  If you test positive for Covid 19 in the 2 weeks post procedure, please call and report this information to [place].    If any biopsies were taken you will be contacted by phone or by letter within the next 1-3 weeks.  Please call us at [phone] if you have not heard about the biopsies in 3 weeks.    SIGNATURES/CONFIDENTIALITY: You and/or your care partner have signed paperwork which will be entered into your electronic medical record.  These signatures attest to the fact that that the information above on your After Visit Summary has been reviewed and is understood.  Full responsibility  of the confidentiality of this discharge information lies with you and/or your care-partner.
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
## 2021-08-07 NOTE — Op Note (Signed)
[facility] Patient Name: Susan Walton Procedure Date: 08/07/2021 10:56 AM MRN: [id number] Endoscopist: Sonny Masters "Christia Reading ,  Age: 40 Referring MD:  Date of Birth: July 15, 1981 Gender: Female Account #: [id number] Procedure:                Upper GI endoscopy Indications:              Epigastric abdominal pain, Dysphagia, Weight loss Medicines:                Monitored Anesthesia Care Procedure:                Pre-Anesthesia Assessment:                           - Prior to the procedure, a History and Physical                            was performed, and patient medications and                            allergies were reviewed. The patient's tolerance of                            previous anesthesia was also reviewed. The risks                            and benefits of the procedure and the sedation                            options and risks were discussed with the patient.                            All questions were answered, and informed consent                            was obtained. Prior Anticoagulants: The patient has                            taken no previous anticoagulant or antiplatelet                            agents. ASA Grade Assessment: II - A patient with                            mild systemic disease. After reviewing the risks                            and benefits, the patient was deemed in                            satisfactory condition to undergo the procedure.                           After obtaining informed consent, the endoscope was  passed under direct vision. Throughout the                            procedure, the patient's blood pressure, pulse, and                            oxygen saturations were monitored continuously. The                            GIF [id number] #[id number] was introduced through the                            mouth, and advanced to the third part of duodenum.                            The  upper GI endoscopy was accomplished without                            difficulty. The patient tolerated the procedure                            well. Scope In: Scope Out: Findings:                 The examined esophagus was normal. Biopsies were                            taken with a cold forceps for histology.                           Localized erythematous mucosa without bleeding was                            found in the gastric antrum. Biopsies were taken                            with a cold forceps for Helicobacter pylori testing.                           Patchy erythematous mucosa without active bleeding                            and with no stigmata of bleeding was found in the                            entire duodenum. Biopsies were taken with a cold                            forceps for histology. Complications:            No immediate complications. Estimated Blood Loss:     Estimated blood loss was minimal. Impression:               - Normal esophagus. Biopsied.                           -  Erythematous mucosa in the antrum. Biopsied.                           - Erythematous duodenopathy. Biopsied. Recommendation:           - Discharge patient to home (with escort).                           - Await pathology results.                           - Return to GI clinic in 2 months.                           - The findings and recommendations were discussed                            with the patient. Sonny Masters "Christia Reading,  08/07/2021 11:29:37 AM

## 2021-08-07 NOTE — Progress Notes (Signed)
Sedate, gd SR, tolerated procedure well, VSS, report to RN 

## 2021-08-07 NOTE — Progress Notes (Signed)
Pt's states no medical or surgical changes since previsit or office visit. 

## 2021-08-10 ENCOUNTER — Other Ambulatory Visit: Payer: Self-pay

## 2021-08-10 ENCOUNTER — Emergency Department (HOSPITAL_COMMUNITY): Payer: Medicaid Other

## 2021-08-10 ENCOUNTER — Observation Stay (HOSPITAL_COMMUNITY)
Admission: EM | Admit: 2021-08-10 | Discharge: 2021-08-12 | Disposition: A | Discharge status: Home / Self Care | Payer: Medicaid Other | Attending: Internal Medicine | Admitting: Internal Medicine

## 2021-08-10 DIAGNOSIS — Z20822 Contact with and (suspected) exposure to covid-19: Secondary | ICD-10-CM | POA: Insufficient documentation

## 2021-08-10 DIAGNOSIS — F17210 Nicotine dependence, cigarettes, uncomplicated: Secondary | ICD-10-CM | POA: Insufficient documentation

## 2021-08-10 DIAGNOSIS — R0600 Dyspnea, unspecified: Secondary | ICD-10-CM | POA: Diagnosis present

## 2021-08-10 DIAGNOSIS — F4310 Post-traumatic stress disorder, unspecified: Secondary | ICD-10-CM | POA: Diagnosis present

## 2021-08-10 DIAGNOSIS — J9601 Acute respiratory failure with hypoxia: Secondary | ICD-10-CM | POA: Diagnosis not present

## 2021-08-10 DIAGNOSIS — F411 Generalized anxiety disorder: Secondary | ICD-10-CM | POA: Diagnosis present

## 2021-08-10 DIAGNOSIS — F332 Major depressive disorder, recurrent severe without psychotic features: Secondary | ICD-10-CM | POA: Diagnosis present

## 2021-08-10 DIAGNOSIS — Z79899 Other long term (current) drug therapy: Secondary | ICD-10-CM | POA: Diagnosis not present

## 2021-08-10 DIAGNOSIS — I10 Essential (primary) hypertension: Secondary | ICD-10-CM | POA: Diagnosis not present

## 2021-08-10 DIAGNOSIS — R1011 Right upper quadrant pain: Secondary | ICD-10-CM | POA: Insufficient documentation

## 2021-08-10 DIAGNOSIS — J45909 Unspecified asthma, uncomplicated: Secondary | ICD-10-CM | POA: Insufficient documentation

## 2021-08-10 DIAGNOSIS — J479 Bronchiectasis, uncomplicated: Secondary | ICD-10-CM

## 2021-08-10 DIAGNOSIS — R112 Nausea with vomiting, unspecified: Secondary | ICD-10-CM

## 2021-08-10 DIAGNOSIS — E876 Hypokalemia: Secondary | ICD-10-CM | POA: Diagnosis not present

## 2021-08-10 DIAGNOSIS — F32A Depression, unspecified: Secondary | ICD-10-CM | POA: Diagnosis present

## 2021-08-10 DIAGNOSIS — J690 Pneumonitis due to inhalation of food and vomit: Secondary | ICD-10-CM | POA: Diagnosis present

## 2021-08-10 LAB — COMPREHENSIVE METABOLIC PANEL
ALT: 5 U/L (ref 0–44)
AST: 24 U/L (ref 15–41)
Albumin: 3.9 g/dL (ref 3.5–5.0)
Alkaline Phosphatase: 30 U/L — ABNORMAL LOW (ref 38–126)
Anion gap: 10 (ref 5–15)
BUN: 10 mg/dL (ref 6–20)
CO2: 26 mmol/L (ref 22–32)
Calcium: 8.8 mg/dL — ABNORMAL LOW (ref 8.9–10.3)
Chloride: 101 mmol/L (ref 98–111)
Creatinine, Ser: 0.66 mg/dL (ref 0.44–1.00)
GFR, Estimated: >60 mL/min (ref >60)
Glucose, Bld: 95 mg/dL (ref 70–99)
Potassium: 3.5 mmol/L (ref 3.5–5.1)
Sodium: 137 mmol/L (ref 135–145)
Total Bilirubin: 1.1 mg/dL (ref 0.3–1.2)
Total Protein: 7 g/dL (ref 6.5–8.1)

## 2021-08-10 LAB — CBC WITH DIFFERENTIAL/PLATELET
Abs Immature Granulocytes: 0.01 10*3/uL (ref 0.00–0.07)
Basophils Absolute: 0 10*3/uL (ref 0.0–0.1)
Basophils Relative: 1 %
Eosinophils Absolute: 1 10*3/uL — ABNORMAL HIGH (ref 0.0–0.5)
Eosinophils Relative: 17 %
HCT: 35.2 % — ABNORMAL LOW (ref 36.0–46.0)
Hemoglobin: 10.6 g/dL — ABNORMAL LOW (ref 12.0–15.0)
Immature Granulocytes: 0 %
Lymphocytes Relative: 37 %
Lymphs Abs: 2.3 10*3/uL (ref 0.7–4.0)
MCH: 21.6 pg — ABNORMAL LOW (ref 26.0–34.0)
MCHC: 30.1 g/dL (ref 30.0–36.0)
MCV: 71.7 fL — ABNORMAL LOW (ref 80.0–100.0)
Monocytes Absolute: 0.4 10*3/uL (ref 0.1–1.0)
Monocytes Relative: 6 %
Neutro Abs: 2.4 10*3/uL (ref 1.7–7.7)
Neutrophils Relative %: 39 %
Platelets: 328 10*3/uL (ref 150–400)
RBC: 4.91 MIL/uL (ref 3.87–5.11)
RDW: 16.9 % — ABNORMAL HIGH (ref 11.5–15.5)
WBC: 6.1 10*3/uL (ref 4.0–10.5)
nRBC: 0 % (ref 0.0–0.2)

## 2021-08-10 LAB — LIPASE, BLOOD: Lipase: 29 U/L (ref 11–51)

## 2021-08-10 LAB — I-STAT BETA HCG BLOOD, ED (MC, WL, AP ONLY): I-stat hCG, quantitative: <5 m[IU]/mL (ref <5)

## 2021-08-10 MED ORDER — PANTOPRAZOLE SODIUM 40 MG IV SOLR
40.0000 mg | Freq: Once | INTRAVENOUS | Status: AC
  Administered 2021-08-10: 40 mg via INTRAVENOUS
  Filled 2021-08-10: qty 40

## 2021-08-10 MED ORDER — KETOROLAC TROMETHAMINE 15 MG/ML IJ SOLN
15.0000 mg | Freq: Once | INTRAMUSCULAR | Status: AC
  Administered 2021-08-10: 15 mg via INTRAVENOUS
  Filled 2021-08-10: qty 1

## 2021-08-10 MED ORDER — IPRATROPIUM BROMIDE 0.02 % IN SOLN
0.5000 mg | Freq: Once | RESPIRATORY_TRACT | Status: AC
  Administered 2021-08-11: 0.5 mg via RESPIRATORY_TRACT
  Filled 2021-08-10: qty 2.5

## 2021-08-10 MED ORDER — ALBUTEROL SULFATE (2.5 MG/3ML) 0.083% IN NEBU
5.0000 mg | INHALATION_SOLUTION | Freq: Once | RESPIRATORY_TRACT | Status: AC
  Administered 2021-08-11: 5 mg via RESPIRATORY_TRACT
  Filled 2021-08-10: qty 6

## 2021-08-10 MED ORDER — METOCLOPRAMIDE HCL 5 MG/ML IJ SOLN
10.0000 mg | INTRAMUSCULAR | Status: AC
  Administered 2021-08-10: 10 mg via INTRAVENOUS
  Filled 2021-08-10: qty 2

## 2021-08-10 MED ORDER — LACTATED RINGERS IV BOLUS
2000.0000 mL | Freq: Once | INTRAVENOUS | Status: AC
  Administered 2021-08-10: 2000 mL via INTRAVENOUS

## 2021-08-10 NOTE — ED Provider Notes (Signed)
[facility] DEPT Provider Note   CSN: [id number] Arrival date & time: 08/10/21  1518     History Chief Complaint  Patient presents with   Nausea   Emesis    Susan Walton is a 40 y.o. female.  40 y/o female with hx of HTN, asthma, schizoaffective d/o, alcoholism presents to the ED for c/o persistent nausea, vomiting, diarrhea. Reports symptoms have been present intermittently for 2 months. She is followed by [facility] for this with recent visit on 08/01/21; subsequent EGD on 08/07/21 which was generally reassuring. She states she continues to vomit daily and has difficulty tolerating food or fluids. Feels generally weak with malaise. Has not taken her Zofran because she did not feel it was helping her. Has also been recently noncompliant with her Bentyl because she feels it makes it hard for her to urinate. Furthermore, has not started her Protonix because she has yet to pick up this Rx. Did receive Zofran via EMS and reports no emesis since this medication was administered. States she has refrained from marijuana use with last use 2 weeks ago. C/o some associated abdominal pain in her lower abdomen. No CP, SOB, urinary symptoms, syncope. Abdominal SHx significant for tubal ligation.  The history is provided by the patient. No language interpreter was used.  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   Schizoaffective disorder ([place]) 06/10/2021   PTSD (post-traumatic stress disorder) 06/10/2021   GAD (generalized anxiety disorder) 06/10/2021   Acute respiratory  failure with hypoxia ([place]) 01/20/2021   Asthma attack 01/19/2021   Asthma 01/19/2021   Group B Streptococcus carrier, +RV culture, currently pregnant 12/10/2017   Gonorrhea affecting pregnancy in first trimester 12/10/2017   Depression affecting pregnancy in third trimester, antepartum 10/20/2017   Pregnancy, supervision, high-risk 07/27/2017   Chronic hypertension with superimposed severe preeclampsia 07/27/2017   Drug abuse during pregnancy ([place]) 07/27/2017   Thyroid dysfunction, antepartum 07/27/2017   History of trichomoniasis 07/27/2017   Unwanted fertility 07/27/2017   Advanced maternal age in multigravida, second trimester    Adjustment disorder with depressed mood 06/05/2017    Past Surgical History:  Procedure Laterality Date   DILATION AND CURETTAGE OF UTERUS     EYE SURGERY Left    TUBAL LIGATION N/A 12/11/2017   Procedure: POST PARTUM TUBAL LIGATION;  Surgeon: [name], MD;  Location: [facility];  Service: Gynecology;  Laterality: N/A;     OB History     Gravida  8   Para  5   Term  5   Preterm  0   AB  3   Living  5      SAB  2   IAB  1   Ectopic  0   Multiple  0   Live Births  5           Family History  Problem Relation Age of Onset   Hypertension Mother    Hypertension Father    COPD Maternal  Grandfather    Liver disease Paternal Grandmother    Liver disease Paternal Grandfather     Social History   Tobacco Use   Smoking status: Every Day    Packs/day: 0.50    Types: Cigarettes   Smokeless tobacco: Never  Vaping Use   Vaping Use: Never used  Substance Use Topics   Alcohol use: Yes    Comment: 4 per day   Drug use: Not Currently    Home Medications Prior to Admission medications   Medication Sig Start Date End Date Taking? Authorizing Provider  acetaminophen (TYLENOL) 500 MG tablet Take 1,000 mg by mouth every 3 (three) hours as needed (for severe pain). Patient not taking: Reported on 08/07/2021     [provider]  albuterol (VENTOLIN HFA) 108 (90 Base) MCG/ACT inhaler Inhale 1-2 puffs into the lungs every 6 (six) hours as needed for wheezing or shortness of breath. 11/13/20   Chase Picket, MD  amLODipine (NORVASC) 10 MG tablet Take 1 tablet (10 mg total) by mouth daily. Patient not taking: Reported on 08/07/2021 01/22/21   Little Ishikawa, MD  calcium carbonate (TUMS EX) 750 MG chewable tablet Chew 1-3 tablets by mouth as needed for heartburn.    [provider]  carvedilol (COREG) 6.25 MG tablet Take 1 tablet (6.25 mg total) by mouth 2 (two) times daily with a meal. Patient not taking: Reported on 08/07/2021 01/21/21   Little Ishikawa, MD  dicyclomine (BENTYL) 10 MG capsule Take 1 capsule (10 mg total) by mouth 3 (three) times daily as needed for spasms (Abdominal pain/Crampingh). 08/01/21   Esterwood, Amy S, PA-C  Dupilumab 300 MG/2ML SOPN Inject 2 mLs into the skin every 14 (fourteen) days. Patient not taking: Reported on 08/01/2021 07/31/21   [provider]  fluticasone (FLONASE) 50 MCG/ACT nasal spray Place 2 sprays into both nostrils daily. 09/12/20   [provider]  hydrOXYzine (VISTARIL) 25 MG capsule Take 25 mg by mouth 3 (three) times daily as needed for anxiety.    [provider]  LATUDA 80 MG TABS tablet Take 80 mg by mouth daily with breakfast.    [provider]  ondansetron (ZOFRAN) 4 MG tablet Take 1 tablet (4 mg total) by mouth every 8 (eight) hours as needed for nausea or vomiting. 08/01/21   Esterwood, Amy S, PA-C  pantoprazole (PROTONIX) 20 MG tablet Take 1 tablet (20 mg total) by mouth daily. Patient not taking: Reported on 08/01/2021 07/15/21   Domenic Moras, PA-C  prazosin (MINIPRESS) 2 MG capsule Take 2 mg by mouth at bedtime. Patient not taking: Reported on 08/01/2021    [provider]  PROAIR HFA 108 [phone] Base) MCG/ACT inhaler Inhale 2 puffs into the lungs every 2 (two) hours as needed for  shortness of breath.    [provider]  sodium chloride (OCEAN) 0.65 % SOLN nasal spray Place 1 spray into both nostrils as needed for congestion. 04/01/21   Charlesetta Shanks, MD  labetalol (NORMODYNE) 100 MG tablet Take 1 tablet (100 mg total) by mouth 2 (two) times daily. 05/24/19 11/13/20  Jaynee Eagles, PA-C  NIFEdipine (PROCARDIA-XL/ADALAT CC) 60 MG 24 hr tablet Take 1 tablet (60 mg total) by mouth 2 (two) times daily. Patient not taking: Reported on 03/28/2019 12/13/17 05/24/19  Osborne Oman, MD  omeprazole (PRILOSEC) 40 MG capsule Take 1 capsule (40 mg total) by mouth daily. 30 minutes before breakfast Patient not taking: Reported on 03/28/2019 05/12/18 05/24/19  Harl Bowie  V, MD    Allergies    Other  Review of Systems   Review of Systems  Gastrointestinal:  Positive for vomiting.  Ten systems reviewed and are negative for acute change, except as noted in the HPI.    Physical Exam Updated Vital Signs BP 106/88 (BP Location: Left Arm)   Pulse (!) 118   Temp 98 F (36.7 C) (Oral)   Resp 16   Ht 5\' 7"  (1.702 m)   Wt 70.8 kg   LMP 07/24/2021   SpO2 92%   BMI 24.43 kg/m   Physical Exam Vitals and nursing note reviewed.  Constitutional:      General: She is not in acute distress.    Appearance: She is well-developed. She is not diaphoretic.     Comments: Chronically ill appearing. Nontoxic.  HENT:     Head: Normocephalic and atraumatic.  Eyes:     General: No scleral icterus.    Conjunctiva/sclera: Conjunctivae normal.  Cardiovascular:     Rate and Rhythm: Normal rate and regular rhythm.     Pulses: Normal pulses.  Pulmonary:     Effort: Pulmonary effort is normal. No respiratory distress.     Comments: Respirations even and unlabored Abdominal:     General: There is no distension.     Palpations: Abdomen is soft.     Comments: Soft, nondistended abdomen with general TTP. No focal tenderness, masses, peritoneal signs.  Musculoskeletal:        General:  Normal range of motion.     Cervical back: Normal range of motion.  Skin:    General: Skin is warm and dry.     Coloration: Skin is not pale.     Findings: No erythema or rash.  Neurological:     Mental Status: She is alert and oriented to person, place, and time.     Coordination: Coordination normal.  Psychiatric:        Behavior: Behavior normal.    ED Results / Procedures / Treatments   Labs (all labs ordered are listed, but only abnormal results are displayed) Labs Reviewed  COMPREHENSIVE METABOLIC PANEL - Abnormal; Notable for the following components:      Result Value   Calcium 8.8 (*)    Alkaline Phosphatase 30 (*)    All other components within normal limits  CBC WITH DIFFERENTIAL/PLATELET - Abnormal; Notable for the following components:   Hemoglobin 10.6 (*)    HCT 35.2 (*)    MCV 71.7 (*)    MCH 21.6 (*)    RDW 16.9 (*)    Eosinophils Absolute 1.0 (*)    All other components within normal limits  RESP PANEL BY RT-PCR (FLU A&B, COVID) ARPGX2  LIPASE, BLOOD  URINALYSIS, ROUTINE W REFLEX MICROSCOPIC  I-STAT BETA HCG BLOOD, ED ([facility], [facility], [facility] ONLY)    EKG EKG Interpretation  Date/Time:  Sunday August 11 2021 01:38:11 EST Ventricular Rate:  120 PR Interval:  137 QRS Duration: 84 QT Interval:  355 QTC Calculation: 502 R Axis:   81 Text Interpretation: Sinus tachycardia Aberrant complex Probable left atrial enlargement Probable left ventricular hypertrophy Borderline prolonged QT interval Confirmed by Veryl Speak [phone]) on 08/11/2021 2:00:27 AM  Radiology CT Angio Chest PE W and/or Wo Contrast  Result Date: 08/11/2021 CLINICAL DATA:  Hypoxia. EXAM: CT ANGIOGRAPHY CHEST WITH CONTRAST TECHNIQUE: Multidetector CT imaging of the chest was performed using the standard protocol during bolus administration of intravenous contrast. Multiplanar CT image reconstructions and MIPs were obtained  to evaluate the vascular anatomy. CONTRAST:  15mL OMNIPAQUE IOHEXOL 350  MG/ML SOLN COMPARISON:  01/19/2021. FINDINGS: Cardiovascular: Satisfactory opacification of the pulmonary arteries to the segmental level. No evidence of pulmonary embolism. Normal heart size. No pericardial effusion. The aorta is normal in caliber. Mediastinum/Nodes: No mediastinal or axillary lymphadenopathy. Prominent lymph nodes are present in the hilar regions bilaterally and are likely reactive. Thyroid gland and trachea demonstrate no significant findings. There is suggested of mild diffuse thickening of the esophagus. Lungs/Pleura: There is bilateral bronchial wall thickening with bronchiectasis predominantly in the lower lobes bilaterally. Debris is present in the right lower lobe bronchioles with regions of mucous plugging versus possible aspirated material. Minimal atelectasis is noted at the lung bases. No effusion or pneumothorax Upper Abdomen: Gastric wall thickening is noted. Musculoskeletal: A 1.1 cm nodule is noted in the subcutaneous fat in the anterior chest wall on the left, axial image 132. No acute osseous abnormality. Review of the MIP images confirms the above findings. IMPRESSION: 1. No evidence of pulmonary embolism. 2. Bilateral bronchial wall thickening with lower lobe bronchiectasis in debris in the distal lower lobe bronchioles bilaterally, possible mucous plugging and/or aspirated material. 3. Thickening of the walls of the esophagus and gastric mucosa, suggesting esophagitis/gastritis. Consider endoscopy for further evaluation on follow-up. 4. 1.1 cm nodule in the subcutaneous fat of the anterior chest wall on the left. Correlation with physical exam is recommended. Electronically Signed   By: Brett Fairy M.D.   On: 08/11/2021 01:49   DG Chest Port 1 View  Result Date: 08/11/2021 CLINICAL DATA:  Shortness of breath. EXAM: PORTABLE CHEST 1 VIEW COMPARISON:  Chest radiograph dated 06/26/2021. FINDINGS: The heart size and mediastinal contours are within normal limits. Both lungs  are clear. The visualized skeletal structures are unremarkable. IMPRESSION: No active disease. Electronically Signed   By: Anner Crete M.D.   On: 08/11/2021 00:01    Procedures .Critical Care Performed by: Antonietta Breach, PA-C Authorized by: Antonietta Breach, PA-C   Critical care provider statement:    Critical care time (minutes):  45   Critical care was necessary to treat or prevent imminent or life-threatening deterioration of the following conditions:  Respiratory failure   Critical care was time spent personally by me on the following activities:  Development of treatment plan with patient or surrogate, discussions with consultants, evaluation of patient's response to treatment, examination of patient, ordering and review of laboratory studies, ordering and review of radiographic studies, ordering and performing treatments and interventions, pulse oximetry, re-evaluation of patient's condition, review of old charts and obtaining history from patient or surrogate   I assumed direction of critical care for this patient from another provider in my specialty: no     Care discussed with: admitting provider     Medications Ordered in ED Medications  lactated ringers infusion ( Intravenous New Bag/Given 08/11/21 0400)  lactated ringers bolus 2,000 mL (0 mLs Intravenous Stopped 08/10/21 2352)  pantoprazole (PROTONIX) injection 40 mg (40 mg Intravenous Given 08/10/21 2258)  metoCLOPramide (REGLAN) injection 10 mg (10 mg Intravenous Given 08/10/21 2256)  ketorolac (TORADOL) 15 MG/ML injection 15 mg (15 mg Intravenous Given 08/10/21 2256)  albuterol (PROVENTIL) (2.5 MG/3ML) 0.083% nebulizer solution 5 mg (5 mg Nebulization Given 08/11/21 0002)  ipratropium (ATROVENT) nebulizer solution 0.5 mg (0.5 mg Nebulization Given 08/11/21 0002)  ipratropium (ATROVENT) nebulizer solution 0.5 mg (0.5 mg Nebulization Given 08/11/21 0127)  albuterol (PROVENTIL) (2.5 MG/3ML) 0.083% nebulizer solution 5 mg (5 mg  Nebulization  Given 08/11/21 0127)  methylPREDNISolone sodium succinate (SOLU-MEDROL) 125 mg/2 mL injection 125 mg (125 mg Intravenous Given 08/11/21 0126)  iohexol (OMNIPAQUE) 350 MG/ML injection 75 mL (75 mLs Intravenous Contrast Given 08/11/21 0105)    ED Course  I have reviewed the triage vital signs and the nursing notes.  Pertinent labs & imaging results that were available during my care of the patient were reviewed by me and considered in my medical decision making (see chart for details).  Clinical Course as of 08/11/21 1638  Sat Aug 10, 2021  2350 Notified by RN that patient began complaining of shortness of breath shortly after starting her IV fluids.  Her oxygen saturations began to drop into the low low 90s and subsequently into the 80s.  She is presently on 6 L oxygen via nasal cannula with sats of approximately 93%.  She is not in any distress upon reexamination, but does have diminished breath sounds bilaterally.  Does have a history of asthma for which she uses albuterol.  DuoNeb treatment ordered.  We will also discontinue fluids pending portable chest x-ray to assess for flash pulmonary edema or other acute cardiopulmonary process.  EKG also added.  No hx of CHF.  Had negative DVT and PE work up in May 2022.   [KH]  Sun Aug 11, 2021  0014 CXR negative for acute process. Will reassess post neb treatment. [KH]  0036 Lung sounds are clear, but patient remains persistently hypoxic to 86-90% on supplemental O2. This is with a good waveform and upon confirmation with 3 different pulse oximetry detectors. Will proceed with CTA. Added second DuoNeb with Solumedrol. Will necessitate admission if hypoxia persists. [GY]  6599 CTA negative for PE. There is bilateral bronchial wall thickening with lower lobe bronchiectasis in debris in the distal lower lobe bronchioles bilaterally; radiology questioning mucous plugging and/or aspirated material. [KH]  3570 Patient now with sats of 100%. Has  been weaved to room air and is maintaining saturations. Tachycardia persisting likely secondary to Duonebs. Will ambulate on room air in 30-45 minutes to assess for desaturations. [VX]  7939 Patient ambulated with no assistance. Pt did not show or verbalize any signs of dizziness. Pt oxygen stats dropped to 83% on Room Air, went back up to 89% while ambulating. Pt heart rate was steady at 145bpm. [KH]  [phone] Hospitalist paged for admission. [KH]  0345 Case discussed with Dr. Hal [facility] of [facility] who will admit. [KH]    Clinical Course User Index [KH] Beverely Pace   MDM Rules/Calculators/A&P                           40 year old female presents to the emergency department, initially, with complaints of nausea, vomiting, diarrhea.  These complaints are chronic for which patient is followed by outpatient GI.  Has had a reassuring CT abdomen/pelvis in the past.  Ultrasound 1 month ago with nonspecific, minimal CBD dilatation.  This has not seemed to correspond with any LFT abnormalities.  Her labs today are stable, reassuring.  Plan was for supportive management; however, patient became hypoxic while in the ED requiring 6 L of supplemental O2.  Has been able to be weaned down to 2 L supplemental oxygen, but becomes hypoxic when on room air, especially with ambulation.  Her imaging today is suspicious for bronchiectasis, questionable mucous plugging.  Low suspicion for infection given lack of fever, leukocytosis, tachypnea.  Does not meet criteria for SIRS/sepsis at the present  time.  Will admit for ongoing assessment and management of ARF w/hypoxia. Dr. Hal Hope to admit.   Final Clinical Impression(s) / ED Diagnoses Final diagnoses:  RUQ abdominal pain  Acute respiratory failure with hypoxia (HCC)  Nausea and vomiting, unspecified vomiting type    Rx / DC Orders ED Discharge Orders     None        Antonietta Breach, PA-C 08/11/21 0416    Veryl Speak, MD 08/11/21 [phone]

## 2021-08-10 NOTE — ED Provider Notes (Signed)
Emergency Medicine Provider Triage Evaluation Note  Susan Walton , a 40 y.o. female  was evaluated in triage.  Pt complains of abdominal pain, tractable nausea, vomiting, and watery diarrhea for the last 2 months.  States that she feels weak, pain is worse in the left lower belly.  Review of Systems  Positive: Abdominal pain, nausea, vomiting, weakness, poor p.o. Negative: Chest pain, shortness of breath and palpitations  Physical Exam  BP (!) 133/107 (BP Location: Left Arm)   Pulse (!) 101   Temp 98.1 F (36.7 C) (Oral)   Resp 18   Ht 5\' 7"  (1.702 m)   Wt 70.8 kg   LMP 07/24/2021   SpO2 100%   BMI 24.43 kg/m  Gen:   Awake, ill-appearing Resp:  Normal effort  MSK:   Moves extremities without difficulty  Other:  generalized abdominal tenderness worse in the LLQ, bilateral CVAT.  Medical Decision Making  Medically screening exam initiated at 8:57 PM.  Appropriate orders placed.  Susan Walton was informed that the remainder of the evaluation will be completed by another provider, this initial triage assessment does not replace that evaluation, and the importance of remaining in the ED until their evaluation is complete.  This chart was dictated using voice recognition software, Dragon. Despite the best efforts of this provider to proofread and correct errors, errors may still occur which can change documentation meaning.    [name] 08/10/21 2058    Daleen Bo, MD 08/11/21 1343

## 2021-08-10 NOTE — ED Triage Notes (Signed)
Patient bib gems , c/o n/v x 2 months, unable to keep food down, diagnosed with enlarged bile ducts, awaiting further testing. En route ems placed 20g in left AC, gave  4mg  zofran, patient comes to ED today because symptoms are not getting any better.

## 2021-08-11 ENCOUNTER — Emergency Department (HOSPITAL_COMMUNITY): Payer: Medicaid Other

## 2021-08-11 ENCOUNTER — Encounter (HOSPITAL_COMMUNITY): Payer: Self-pay

## 2021-08-11 DIAGNOSIS — R112 Nausea with vomiting, unspecified: Secondary | ICD-10-CM

## 2021-08-11 DIAGNOSIS — J9601 Acute respiratory failure with hypoxia: Secondary | ICD-10-CM

## 2021-08-11 DIAGNOSIS — J45901 Unspecified asthma with (acute) exacerbation: Secondary | ICD-10-CM

## 2021-08-11 DIAGNOSIS — R0600 Dyspnea, unspecified: Secondary | ICD-10-CM | POA: Diagnosis present

## 2021-08-11 LAB — CBC WITH DIFFERENTIAL/PLATELET
Abs Immature Granulocytes: 0.03 10*3/uL (ref 0.00–0.07)
Basophils Absolute: 0 10*3/uL (ref 0.0–0.1)
Basophils Relative: 0 %
Eosinophils Absolute: 0.1 10*3/uL (ref 0.0–0.5)
Eosinophils Relative: 1 %
HCT: 41.8 % (ref 36.0–46.0)
Hemoglobin: 12.5 g/dL (ref 12.0–15.0)
Immature Granulocytes: 0 %
Lymphocytes Relative: 7 %
Lymphs Abs: 0.6 10*3/uL — ABNORMAL LOW (ref 0.7–4.0)
MCH: 22 pg — ABNORMAL LOW (ref 26.0–34.0)
MCHC: 29.9 g/dL — ABNORMAL LOW (ref 30.0–36.0)
MCV: 73.7 fL — ABNORMAL LOW (ref 80.0–100.0)
Monocytes Absolute: 0.2 10*3/uL (ref 0.1–1.0)
Monocytes Relative: 2 %
Neutro Abs: 8.3 10*3/uL — ABNORMAL HIGH (ref 1.7–7.7)
Neutrophils Relative %: 90 %
Platelets: 444 10*3/uL — ABNORMAL HIGH (ref 150–400)
RBC: 5.67 MIL/uL — ABNORMAL HIGH (ref 3.87–5.11)
RDW: 17.6 % — ABNORMAL HIGH (ref 11.5–15.5)
WBC: 9.3 10*3/uL (ref 4.0–10.5)
nRBC: 0 % (ref 0.0–0.2)

## 2021-08-11 LAB — HEPATIC FUNCTION PANEL
ALT: 8 U/L (ref 0–44)
AST: 14 U/L — ABNORMAL LOW (ref 15–41)
Albumin: 3.9 g/dL (ref 3.5–5.0)
Alkaline Phosphatase: 31 U/L — ABNORMAL LOW (ref 38–126)
Bilirubin, Direct: 0.1 mg/dL (ref 0.0–0.2)
Indirect Bilirubin: 0.7 mg/dL (ref 0.3–0.9)
Total Bilirubin: 0.8 mg/dL (ref 0.3–1.2)
Total Protein: 7 g/dL (ref 6.5–8.1)

## 2021-08-11 LAB — BASIC METABOLIC PANEL
Anion gap: 13 (ref 5–15)
BUN: 12 mg/dL (ref 6–20)
CO2: 24 mmol/L (ref 22–32)
Calcium: 9.1 mg/dL (ref 8.9–10.3)
Chloride: 98 mmol/L (ref 98–111)
Creatinine, Ser: 0.79 mg/dL (ref 0.44–1.00)
GFR, Estimated: >60 mL/min (ref >60)
Glucose, Bld: 120 mg/dL — ABNORMAL HIGH (ref 70–99)
Potassium: 2.9 mmol/L — ABNORMAL LOW (ref 3.5–5.1)
Sodium: 135 mmol/L (ref 135–145)

## 2021-08-11 LAB — RESP PANEL BY RT-PCR (FLU A&B, COVID) ARPGX2
Influenza A by PCR: NEGATIVE
Influenza B by PCR: NEGATIVE
SARS Coronavirus 2 by RT PCR: NEGATIVE

## 2021-08-11 LAB — FERRITIN: Ferritin: 15 ng/mL (ref 11–307)

## 2021-08-11 LAB — RAPID URINE DRUG SCREEN, HOSP PERFORMED
Amphetamines: NOT DETECTED
Barbiturates: NOT DETECTED
Benzodiazepines: NOT DETECTED
Cocaine: NOT DETECTED
Opiates: NOT DETECTED
Tetrahydrocannabinol: POSITIVE — AB

## 2021-08-11 LAB — RETICULOCYTES
Immature Retic Fract: 13.8 % (ref 2.3–15.9)
RBC.: 5.6 MIL/uL — ABNORMAL HIGH (ref 3.87–5.11)
Retic Count, Absolute: 58.2 10*3/uL (ref 19.0–186.0)
Retic Ct Pct: 1 % (ref 0.4–3.1)

## 2021-08-11 LAB — MAGNESIUM: Magnesium: 1.9 mg/dL (ref 1.7–2.4)

## 2021-08-11 LAB — VITAMIN B12: Vitamin B-12: 1294 pg/mL — ABNORMAL HIGH (ref 180–914)

## 2021-08-11 LAB — FOLATE: Folate: 12.8 ng/mL (ref >5.9)

## 2021-08-11 LAB — IRON AND TIBC
Iron: 13 ug/dL — ABNORMAL LOW (ref 28–170)
Saturation Ratios: 3 % — ABNORMAL LOW (ref 10.4–31.8)
TIBC: 439 ug/dL (ref 250–450)
UIBC: 426 ug/dL

## 2021-08-11 LAB — C DIFFICILE QUICK SCREEN W PCR REFLEX
C Diff antigen: NEGATIVE
C Diff interpretation: NOT DETECTED
C Diff toxin: NEGATIVE

## 2021-08-11 MED ORDER — ACETAMINOPHEN 650 MG RE SUPP: 650.0000 mg | Freq: Four times a day (QID) | RECTAL | Status: DC | PRN

## 2021-08-11 MED ORDER — LACTATED RINGERS IV SOLN: INTRAVENOUS | Status: AC

## 2021-08-11 MED ORDER — LACTATED RINGERS IV SOLN: INTRAVENOUS | Status: DC

## 2021-08-11 MED ORDER — SODIUM CHLORIDE 0.9 % IV SOLN
3.0000 g | Freq: Four times a day (QID) | INTRAVENOUS | Status: DC
  Administered 2021-08-11 – 2021-08-12 (×4): 3 g via INTRAVENOUS
  Filled 2021-08-11 (×5): qty 8

## 2021-08-11 MED ORDER — GUAIFENESIN ER 600 MG PO TB12
1200.0000 mg | ORAL_TABLET | Freq: Two times a day (BID) | ORAL | Status: DC
  Administered 2021-08-11 – 2021-08-12 (×3): 1200 mg via ORAL
  Filled 2021-08-11 (×3): qty 2

## 2021-08-11 MED ORDER — METHYLPREDNISOLONE SODIUM SUCC 125 MG IJ SOLR
125.0000 mg | Freq: Once | INTRAMUSCULAR | Status: AC
  Administered 2021-08-11: 01:00:00 125 mg via INTRAVENOUS
  Filled 2021-08-11: qty 2

## 2021-08-11 MED ORDER — ONDANSETRON HCL 4 MG PO TABS: 4.0000 mg | ORAL_TABLET | Freq: Four times a day (QID) | ORAL | Status: DC | PRN

## 2021-08-11 MED ORDER — ENOXAPARIN SODIUM 40 MG/0.4ML IJ SOSY
40.0000 mg | PREFILLED_SYRINGE | INTRAMUSCULAR | Status: DC
  Filled 2021-08-11: qty 0.4

## 2021-08-11 MED ORDER — SIMETHICONE 80 MG PO CHEW
160.0000 mg | CHEWABLE_TABLET | Freq: Four times a day (QID) | ORAL | Status: DC
  Administered 2021-08-11 – 2021-08-12 (×2): 160 mg via ORAL
  Filled 2021-08-11 (×3): qty 2

## 2021-08-11 MED ORDER — METHYLPREDNISOLONE SODIUM SUCC 40 MG IJ SOLR
40.0000 mg | Freq: Two times a day (BID) | INTRAMUSCULAR | Status: DC
  Administered 2021-08-11 – 2021-08-12 (×2): 40 mg via INTRAVENOUS
  Filled 2021-08-11 (×2): qty 1

## 2021-08-11 MED ORDER — AMLODIPINE BESYLATE 10 MG PO TABS
10.0000 mg | ORAL_TABLET | Freq: Every day | ORAL | Status: DC
  Administered 2021-08-11 – 2021-08-12 (×2): 10 mg via ORAL
  Filled 2021-08-11 (×3): qty 1

## 2021-08-11 MED ORDER — BISACODYL 10 MG RE SUPP
10.0000 mg | Freq: Once | RECTAL | Status: DC
  Filled 2021-08-11: qty 1

## 2021-08-11 MED ORDER — IPRATROPIUM BROMIDE 0.02 % IN SOLN
0.5000 mg | Freq: Three times a day (TID) | RESPIRATORY_TRACT | Status: DC
  Administered 2021-08-12: 08:00:00 0.5 mg via RESPIRATORY_TRACT
  Filled 2021-08-11: qty 2.5

## 2021-08-11 MED ORDER — LEVALBUTEROL HCL 0.63 MG/3ML IN NEBU: 0.6300 mg | INHALATION_SOLUTION | Freq: Four times a day (QID) | RESPIRATORY_TRACT | Status: DC | PRN

## 2021-08-11 MED ORDER — BUDESONIDE 0.25 MG/2ML IN SUSP
0.2500 mg | Freq: Two times a day (BID) | RESPIRATORY_TRACT | Status: DC
  Administered 2021-08-11: 08:00:00 0.25 mg via RESPIRATORY_TRACT
  Filled 2021-08-11: qty 2

## 2021-08-11 MED ORDER — IPRATROPIUM BROMIDE 0.02 % IN SOLN
0.5000 mg | Freq: Four times a day (QID) | RESPIRATORY_TRACT | Status: DC
  Administered 2021-08-11 (×2): 0.5 mg via RESPIRATORY_TRACT
  Filled 2021-08-11 (×2): qty 2.5

## 2021-08-11 MED ORDER — LEVALBUTEROL HCL 0.63 MG/3ML IN NEBU
0.6300 mg | INHALATION_SOLUTION | Freq: Four times a day (QID) | RESPIRATORY_TRACT | Status: DC
  Administered 2021-08-11 (×3): 0.63 mg via RESPIRATORY_TRACT
  Filled 2021-08-11 (×3): qty 3

## 2021-08-11 MED ORDER — ALBUTEROL SULFATE (2.5 MG/3ML) 0.083% IN NEBU
5.0000 mg | INHALATION_SOLUTION | Freq: Once | RESPIRATORY_TRACT | Status: AC
  Administered 2021-08-11: 01:00:00 5 mg via RESPIRATORY_TRACT
  Filled 2021-08-11: qty 6

## 2021-08-11 MED ORDER — LEVALBUTEROL HCL 0.63 MG/3ML IN NEBU
0.6300 mg | INHALATION_SOLUTION | Freq: Three times a day (TID) | RESPIRATORY_TRACT | Status: DC
  Administered 2021-08-12: 08:00:00 0.63 mg via RESPIRATORY_TRACT
  Filled 2021-08-11: qty 3

## 2021-08-11 MED ORDER — IOHEXOL 350 MG/ML SOLN
75.0000 mL | Freq: Once | INTRAVENOUS | Status: AC | PRN
  Administered 2021-08-11: 01:00:00 75 mL via INTRAVENOUS

## 2021-08-11 MED ORDER — BUDESONIDE 0.5 MG/2ML IN SUSP
0.5000 mg | Freq: Two times a day (BID) | RESPIRATORY_TRACT | Status: DC
  Administered 2021-08-11 – 2021-08-12 (×2): 0.5 mg via RESPIRATORY_TRACT
  Filled 2021-08-11 (×2): qty 2

## 2021-08-11 MED ORDER — ONDANSETRON HCL 4 MG/2ML IJ SOLN: 4.0000 mg | Freq: Four times a day (QID) | INTRAMUSCULAR | Status: DC | PRN

## 2021-08-11 MED ORDER — IPRATROPIUM BROMIDE 0.02 % IN SOLN
0.5000 mg | Freq: Once | RESPIRATORY_TRACT | Status: AC
  Administered 2021-08-11: 01:00:00 0.5 mg via RESPIRATORY_TRACT
  Filled 2021-08-11: qty 2.5

## 2021-08-11 MED ORDER — PANTOPRAZOLE SODIUM 40 MG IV SOLR
40.0000 mg | INTRAVENOUS | Status: DC
  Administered 2021-08-11: 11:00:00 40 mg via INTRAVENOUS
  Filled 2021-08-11: qty 40

## 2021-08-11 MED ORDER — ACETAMINOPHEN 325 MG PO TABS
650.0000 mg | ORAL_TABLET | Freq: Four times a day (QID) | ORAL | Status: DC | PRN
  Administered 2021-08-11 – 2021-08-12 (×2): 650 mg via ORAL
  Filled 2021-08-11 (×2): qty 2

## 2021-08-11 MED ORDER — POTASSIUM CHLORIDE CRYS ER 10 MEQ PO TBCR
40.0000 meq | EXTENDED_RELEASE_TABLET | ORAL | Status: AC
  Administered 2021-08-11 (×2): 40 meq via ORAL
  Filled 2021-08-11 (×2): qty 4

## 2021-08-11 NOTE — Progress Notes (Signed)
Pharmacy Antibiotic Note  Susan Walton is a 40 y.o. female admitted on 08/10/2021 with acute respiratory failure.. CT of chest concerning for aspiration pneumonia.  Pharmacy has been consulted for Unasyn dosing.  Plan: Unasyn 3gm IV q6h Need for further dosage adjustment appears unlikely at present.    Will sign off at this time.  Please reconsult if a change in clinical status warrants re-evaluation of dosage.  Height: 5\' 7"  (170.2 cm) Weight: 70.8 kg (156 lb) IBW/kg (Calculated) : 61.6  Temp (24hrs), Avg:98 F (36.7 C), Min:97.9 F (36.6 C), Max:98.1 F (36.7 C)  Recent Labs  Lab 08/10/21 2057  WBC 6.1  CREATININE 0.66    Estimated Creatinine Clearance: 90.9 mL/min (by C-G formula based on SCr of 0.66 mg/dL).    Allergies  Allergen Reactions   Other Other (See Comments)    Unnamed antibiotic- Bradycardia (received when hospitalized in May, 2022- received Rocephin, Azithromycin, and Augmentin)    Thank you for allowing pharmacy to be a part of this patient's care.  Everette Rank, PharmD 08/11/2021 6:12 AM

## 2021-08-11 NOTE — ED Notes (Signed)
ED TO INPATIENT HANDOFF REPORT  ED Nurse Name and Phone #: [phone] Erick Colace, RN   S Name/Age/Gender Susan Walton 40 y.o. female Room/Bed: WA14/WA14  Code Status   Code Status: Prior  Home/SNF/Other Home Patient oriented to: self, place, time, and situation Is this baseline? Yes   Triage Complete: Triage complete  Chief Complaint Dyspnea [R06.00]  Triage Note Patient bib gems , c/o n/v x 2 months, unable to keep food down, diagnosed with enlarged bile ducts, awaiting further testing. En route ems placed 20g in left AC, gave  4mg  zofran, patient comes to ED today because symptoms are not getting any better.    Allergies Allergies  Allergen Reactions   Other Other (See Comments)    Unnamed antibiotic- Bradycardia (received when hospitalized in May, 2022- received Rocephin, Azithromycin, and Augmentin)    Level of Care/Admitting Diagnosis ED Disposition     ED Disposition  Admit   Condition  --   [place]: [place] [100102]  Level of Care: Telemetry [5]  Admit to tele based on following criteria: Monitor for Ischemic changes  May place patient in observation at [facility] or [place] if equivalent level of care is available:: No  Covid Evaluation: Asymptomatic Screening Protocol (No Symptoms)  Diagnosis: Dyspnea [854627]  Admitting Physician: Susan Walton [phone]  Attending Physician: Beryle Flock          B Medical/Surgery History Past Medical History:  Diagnosis Date   Alcoholism ([place])    no alcohol x 1 month per patient 06/2021   Anemia    Anxiety    Asthma attack 01/19/2021   Blind left eye    Depression    Fibroid    Hypertension    Retinal detachment    Past Surgical History:  Procedure Laterality Date   DILATION AND CURETTAGE OF UTERUS     EYE SURGERY Left    TUBAL LIGATION N/A 12/11/2017   Procedure: POST PARTUM TUBAL LIGATION;  Surgeon: [name], MD;  Location: [facility];  Service: Gynecology;  Laterality: N/A;     A IV Location/Drains/Wounds Patient Lines/Drains/Airways Status     Active Line/Drains/Airways     Name Placement date Placement time Site Days   Peripheral IV 08/10/21 Left Antecubital 08/10/21  1528  Antecubital  1            Intake/Output Last 24 hours  Intake/Output Summary (Last 24 hours) at 08/11/2021 0409 Last data filed at 08/10/2021 2352 Gross per 24 hour  Intake 882.45 ml  Output --  Net 882.45 ml    Labs/Imaging Results for orders placed or performed during the hospital encounter of 08/10/21 (from the past 48 hour(s))  Comprehensive metabolic panel     Status: Abnormal   Collection Time: 08/10/21  8:57 PM  Result Value Ref Range   Sodium 137 135 - 145 mmol/L   Potassium 3.5 3.5 - 5.1 mmol/L   Chloride 101 98 - 111 mmol/L   CO2 26 22 - 32 mmol/L   Glucose, Bld 95 70 - 99 mg/dL    Comment: Glucose reference range applies only to samples taken after fasting for at least 8 hours.   BUN 10 6 - 20 mg/dL   Creatinine, Ser 0.66 0.44 - 1.00 mg/dL   Calcium 8.8 (L) 8.9 - 10.3 mg/dL   Total Protein 7.0 6.5 - 8.1 g/dL   Albumin 3.9 3.5 - 5.0 g/dL   AST 24 15 -  41 U/L   ALT 5 0 - 44 U/L   Alkaline Phosphatase 30 (L) 38 - 126 U/L   Total Bilirubin 1.1 0.3 - 1.2 mg/dL   GFR, Estimated >60 >60 mL/min    Comment: (NOTE) Calculated using the CKD-EPI Creatinine Equation (2021)    Anion gap 10 5 - 15    Comment: Performed at [facility], [place] [address], [place], [place] [address]  CBC with Differential     Status: Abnormal   Collection Time: 08/10/21  8:57 PM  Result Value Ref Range   WBC 6.1 4.0 - 10.5 K/uL   RBC 4.91 3.87 - 5.11 MIL/uL   Hemoglobin 10.6 (L) 12.0 - 15.0 g/dL   HCT 35.2 (L) 36.0 - 46.0 %   MCV 71.7 (L) 80.0 - 100.0 fL   MCH 21.6 (L) 26.0 - 34.0 pg   MCHC 30.1 30.0 - 36.0 g/dL   RDW 16.9 (H) 11.5 - 15.5 %   Platelets 328 150 - 400 K/uL   nRBC 0.0 0.0 - 0.2 %    Neutrophils Relative % 39 %   Neutro Abs 2.4 1.7 - 7.7 K/uL   Lymphocytes Relative 37 %   Lymphs Abs 2.3 0.7 - 4.0 K/uL   Monocytes Relative 6 %   Monocytes Absolute 0.4 0.1 - 1.0 K/uL   Eosinophils Relative 17 %   Eosinophils Absolute 1.0 (H) 0.0 - 0.5 K/uL   Basophils Relative 1 %   Basophils Absolute 0.0 0.0 - 0.1 K/uL   Immature Granulocytes 0 %   Abs Immature Granulocytes 0.01 0.00 - 0.07 K/uL    Comment: Performed at [facility], [place] [address], [place], [place] [address]  Lipase, blood     Status: None   Collection Time: 08/10/21  8:57 PM  Result Value Ref Range   Lipase 29 11 - 51 U/L    Comment: Performed at [facility], [place] [address], [place], [place] [address]  I-Stat Beta hCG blood, ED ([facility], [facility], [facility] only)     Status: None   Collection Time: 08/10/21  9:10 PM  Result Value Ref Range   I-stat hCG, quantitative <5.0 <5 mIU/mL   Comment 3            Comment:   GEST. AGE      CONC.  (mIU/mL)   <=1 WEEK        5 - 50     2 WEEKS       50 - 500     3 WEEKS       100 - 10,000     4 WEEKS     1,000 - 30,000        FEMALE AND NON-PREGNANT FEMALE:     LESS THAN 5 mIU/mL    CT Angio Chest PE W and/or Wo Contrast  Result Date: 08/11/2021 CLINICAL DATA:  Hypoxia. EXAM: CT ANGIOGRAPHY CHEST WITH CONTRAST TECHNIQUE: Multidetector CT imaging of the chest was performed using the standard protocol during bolus administration of intravenous contrast. Multiplanar CT image reconstructions and MIPs were obtained to evaluate the vascular anatomy. CONTRAST:  61mL OMNIPAQUE IOHEXOL 350 MG/ML SOLN COMPARISON:  01/19/2021. FINDINGS: Cardiovascular: Satisfactory opacification of the pulmonary arteries to the segmental level. No evidence of pulmonary embolism. Normal heart size. No pericardial effusion. The aorta is normal in caliber. Mediastinum/Nodes: No mediastinal or axillary lymphadenopathy. Prominent lymph nodes are present in the hilar regions bilaterally  and are likely reactive. Thyroid  gland and trachea demonstrate no significant findings. There is suggested of mild diffuse thickening of the esophagus. Lungs/Pleura: There is bilateral bronchial wall thickening with bronchiectasis predominantly in the lower lobes bilaterally. Debris is present in the right lower lobe bronchioles with regions of mucous plugging versus possible aspirated material. Minimal atelectasis is noted at the lung bases. No effusion or pneumothorax Upper Abdomen: Gastric wall thickening is noted. Musculoskeletal: A 1.1 cm nodule is noted in the subcutaneous fat in the anterior chest wall on the left, axial image 132. No acute osseous abnormality. Review of the MIP images confirms the above findings. IMPRESSION: 1. No evidence of pulmonary embolism. 2. Bilateral bronchial wall thickening with lower lobe bronchiectasis in debris in the distal lower lobe bronchioles bilaterally, possible mucous plugging and/or aspirated material. 3. Thickening of the walls of the esophagus and gastric mucosa, suggesting esophagitis/gastritis. Consider endoscopy for further evaluation on follow-up. 4. 1.1 cm nodule in the subcutaneous fat of the anterior chest wall on the left. Correlation with physical exam is recommended. Electronically Signed   By: Brett Fairy M.D.   On: 08/11/2021 01:49   DG Chest Port 1 View  Result Date: 08/11/2021 CLINICAL DATA:  Shortness of breath. EXAM: PORTABLE CHEST 1 VIEW COMPARISON:  Chest radiograph dated 06/26/2021. FINDINGS: The heart size and mediastinal contours are within normal limits. Both lungs are clear. The visualized skeletal structures are unremarkable. IMPRESSION: No active disease. Electronically Signed   By: Anner Crete M.D.   On: 08/11/2021 00:01    Pending Labs Unresulted Labs (From admission, onward)     Start     Ordered   08/11/21 0339  Resp Panel by RT-PCR (Flu A&B, Covid) Nasopharyngeal Swab  (Tier 2 - Symptomatic/asymptomatic)  Once,   STAT         08/11/21 0338   08/10/21 2054  Urinalysis, Routine w reflex microscopic  Once,   STAT        08/10/21 2053            Vitals/Pain Today's Vitals   08/11/21 0200 08/11/21 0230 08/11/21 0300 08/11/21 0330  BP: 113/81 108/73 100/74 95/76  Pulse: (!) 122 (!) 126 (!) 120 (!) 118  Resp: 19 20 17 14   Temp:      TempSrc:      SpO2: 100% 92% 91% 94%  Weight:      Height:      PainSc:        Isolation Precautions No active isolations  Medications Medications  lactated ringers infusion ( Intravenous New Bag/Given 08/11/21 0400)  lactated ringers bolus 2,000 mL (0 mLs Intravenous Stopped 08/10/21 2352)  pantoprazole (PROTONIX) injection 40 mg (40 mg Intravenous Given 08/10/21 2258)  metoCLOPramide (REGLAN) injection 10 mg (10 mg Intravenous Given 08/10/21 2256)  ketorolac (TORADOL) 15 MG/ML injection 15 mg (15 mg Intravenous Given 08/10/21 2256)  albuterol (PROVENTIL) (2.5 MG/3ML) 0.083% nebulizer solution 5 mg (5 mg Nebulization Given 08/11/21 0002)  ipratropium (ATROVENT) nebulizer solution 0.5 mg (0.5 mg Nebulization Given 08/11/21 0002)  ipratropium (ATROVENT) nebulizer solution 0.5 mg (0.5 mg Nebulization Given 08/11/21 0127)  albuterol (PROVENTIL) (2.5 MG/3ML) 0.083% nebulizer solution 5 mg (5 mg Nebulization Given 08/11/21 0127)  methylPREDNISolone sodium succinate (SOLU-MEDROL) 125 mg/2 mL injection 125 mg (125 mg Intravenous Given 08/11/21 0126)  iohexol (OMNIPAQUE) 350 MG/ML injection 75 mL (75 mLs Intravenous Contrast Given 08/11/21 0105)    Mobility walks Low fall risk   Focused Assessments    R Recommendations: See Admitting Provider Note  Report given to:   Additional Notes:

## 2021-08-11 NOTE — Progress Notes (Addendum)
I have seen and assessed patient and agree with Dr. Moise Boring assessment and plan.  Patient is a 40 year old female history of hypertension, depression, polysubstance abuse, anemia recently seen by gastroenterologist for epigastric discomfort, intractable nausea vomiting EGD done 4 days prior to admission showed a erythematous antrum and duodenopathy.  Patient presented to the ED with intractable nausea vomiting and diarrhea over the past few days and said he had lost at least 20 pounds and unable to keep anything down.  Patient noted last alcoholic drink was 2 months prior to admission.  Patient seen in the ED initially given antiemetics and painkillers for abdominal pain and vomiting subsequently became short of breath, tachycardic and hypoxic with exertion.  CT angiogram chest done concerning for bronchiectasis and aspiration, mucous plugging.  Due to persistent tachycardia and hypoxia patient admitted for further evaluation.  Patient noted to have been given some steroids and nebs for wheezing.  COVID 19 PCR negative.  EKG with sinus tachycardia. Patient seen and noted to be tolerating full liquid diet without any further nausea or vomiting or abdominal pain or diarrhea.  Patient stated feeling much better.  Feels shortness of breath has improved and noted to be satting 97% on room air.  We will advance diet to a soft diet.  Replete potassium.  Place on Mucinex, Pulmicort nebs, Atrovent nebs, IV steroids, PPI. Reassess in the morning and if continued improvement, tolerating diet, resolution of hypoxia could potentially be discharged home with outpatient follow-up.  No charge.

## 2021-08-11 NOTE — ED Notes (Signed)
PT ambulated with no assistance. Pt did not show or verbalize any signs of dizziness. Pt oxygen stats dropped to 83% on Room Air, went back up to 89% while ambulating. Pt heart rate was steady at 145. EDP Delo, MD made aware.

## 2021-08-11 NOTE — Plan of Care (Signed)

## 2021-08-11 NOTE — Progress Notes (Addendum)
Provided PT with [place], education and technique- PT did not appear interested in conversation. Also, spoke with PT about Q4 CPT with Vest- again appeared not to be interested. RT will re attempt at approx 1400 (with next scheduled neb treatment). RN aware.

## 2021-08-11 NOTE — Progress Notes (Incomplete)
PROGRESS NOTE    Susan Walton  GEX:[id number] DOB: 14-Jun-1981 DOA: 08/10/2021 PCP: Medicine, [facility] (Confirm with patient/family/NH records and if not entered, this HAS to be entered at [facility] point of entry. "No PCP" if truly none.)   Chief Complaint  Patient presents with   Nausea   Emesis    Brief Narrative: (Start on day 1 of progress note - keep it brief and live) ***   Assessment & Plan:   Principal Problem:   Dyspnea Active Problems:   Asthma   Acute respiratory failure with hypoxia (HCC)   Nausea and vomiting   Acute respiratory failure with hypoxemia (HCC)   ***   DVT prophylaxis: (Lovenox/Heparin/SCD's/anticoagulated/None (if comfort care) Code Status: (Full/Partial - specify details) Family Communication: (Specify name, relationship & date discussed. NO "discussed with patient") Disposition:   Status is: Observation  {Observation:23811}      Consultants:  ***  Procedures: (Don't include imaging studies which can be auto populated. Include things that cannot be auto populated i.e. Echo, Carotid and venous dopplers, Foley, Bipap, HD, tubes/drains, wound vac, central lines etc) ***  Antimicrobials: (specify start and planned stop date. Auto populated tables are space occupying and do not give end dates) ***    Subjective: ***  Objective: Vitals:   08/11/21 0536 08/11/21 0755 08/11/21 0938 08/11/21 1119  BP: 101/82  120/78 112/72  Pulse: (!) 106  90 90  Resp: 14  15 13   Temp:   98.2 F (36.8 C) 98.5 F (36.9 C)  TempSrc: Oral  Oral Oral  SpO2: 100% 97% 98% 97%  Weight:      Height:        Intake/Output Summary (Last 24 hours) at 08/11/2021 1411 Last data filed at 08/11/2021 0600 Gross per 24 hour  Intake 1001.5 ml  Output --  Net 1001.5 ml   Filed Weights   08/10/21 1528  Weight: 70.8 kg    Examination:  General exam: Appears calm and comfortable  Respiratory system: Clear to auscultation. Respiratory effort  normal. Cardiovascular system: S1 & S2 heard, RRR. No JVD, murmurs, rubs, gallops or clicks. No pedal edema. Gastrointestinal system: Abdomen is nondistended, soft and nontender. No organomegaly or masses felt. Normal bowel sounds heard. Central nervous system: Alert and oriented. No focal neurological deficits. Extremities: Symmetric 5 x 5 power. Skin: No rashes, lesions or ulcers Psychiatry: Judgement and insight appear normal. Mood & affect appropriate.     Data Reviewed: I have personally reviewed following labs and imaging studies  CBC: Recent Labs  Lab 08/10/21 2057 08/11/21 0606  WBC 6.1 9.3  NEUTROABS 2.4 8.3*  HGB 10.6* 12.5  HCT 35.2* 41.8  MCV 71.7* 73.7*  PLT 328 444*    Basic Metabolic Panel: Recent Labs  Lab 08/10/21 2057 08/11/21 0606  NA 137 135  K 3.5 2.9*  CL 101 98  CO2 26 24  GLUCOSE 95 120*  BUN 10 12  CREATININE 0.66 0.79  CALCIUM 8.8* 9.1  MG  --  1.9    GFR: Estimated Creatinine Clearance: 90.9 mL/min (by C-G formula based on SCr of 0.79 mg/dL).  Liver Function Tests: Recent Labs  Lab 08/10/21 2057 08/11/21 0606  AST 24 14*  ALT 5 8  ALKPHOS 30* 31*  BILITOT 1.1 0.8  PROT 7.0 7.0  ALBUMIN 3.9 3.9    CBG: No results for input(s): GLUCAP in the last 168 hours.   Recent Results (from the past 240 hour(s))  Resp Panel by RT-PCR (  Flu A&B, Covid) Nasopharyngeal Swab     Status: None   Collection Time: 08/11/21  4:01 AM   Specimen: Nasopharyngeal Swab; Nasopharyngeal(NP) swabs in vial transport medium  Result Value Ref Range Status   SARS Coronavirus 2 by RT PCR NEGATIVE NEGATIVE Final    Comment: (NOTE) SARS-CoV-2 target nucleic acids are NOT DETECTED.  The SARS-CoV-2 RNA is generally detectable in upper respiratory specimens during the acute phase of infection. The lowest concentration of SARS-CoV-2 viral copies this assay can detect is 138 copies/mL. A negative result does not preclude SARS-Cov-2 infection and should not be  used as the sole basis for treatment or other patient management decisions. A negative result may occur with  improper specimen collection/handling, submission of specimen other than nasopharyngeal swab, presence of viral mutation(s) within the areas targeted by this assay, and inadequate number of viral copies(<138 copies/mL). A negative result must be combined with clinical observations, patient history, and epidemiological information. The expected result is Negative.  Fact Sheet for Patients:  EntrepreneurPulse.com.au  Fact Sheet for Healthcare Providers:  IncredibleEmployment.be  This test is no t yet approved or cleared by the [place] FDA and  has been authorized for detection and/or diagnosis of SARS-CoV-2 by FDA under an Emergency Use Authorization (EUA). This EUA will remain  in effect (meaning this test can be used) for the duration of the COVID-19 declaration under Section 564(b)(1) of the Act, 21 U.S.C.section 360bbb-3(b)(1), unless the authorization is terminated  or revoked sooner.       Influenza A by PCR NEGATIVE NEGATIVE Final   Influenza B by PCR NEGATIVE NEGATIVE Final    Comment: (NOTE) The Xpert Xpress SARS-CoV-2/FLU/RSV plus assay is intended as an aid in the diagnosis of influenza from Nasopharyngeal swab specimens and should not be used as a sole basis for treatment. Nasal washings and aspirates are unacceptable for Xpert Xpress SARS-CoV-2/FLU/RSV testing.  Fact Sheet for Patients: EntrepreneurPulse.com.au  Fact Sheet for Healthcare Providers: IncredibleEmployment.be  This test is not yet approved or cleared by the [place] FDA and has been authorized for detection and/or diagnosis of SARS-CoV-2 by FDA under an Emergency Use Authorization (EUA). This EUA will remain in effect (meaning this test can be used) for the duration of the COVID-19 declaration under Section  564(b)(1) of the Act, 21 U.S.C. section 360bbb-3(b)(1), unless the authorization is terminated or revoked.  Performed at [facility], [place] [address] [place], [place] [address]          Radiology Studies: CT Angio Chest PE W and/or Wo Contrast  Result Date: 08/11/2021 CLINICAL DATA:  Hypoxia. EXAM: CT ANGIOGRAPHY CHEST WITH CONTRAST TECHNIQUE: Multidetector CT imaging of the chest was performed using the standard protocol during bolus administration of intravenous contrast. Multiplanar CT image reconstructions and MIPs were obtained to evaluate the vascular anatomy. CONTRAST:  76mL OMNIPAQUE IOHEXOL 350 MG/ML SOLN COMPARISON:  01/19/2021. FINDINGS: Cardiovascular: Satisfactory opacification of the pulmonary arteries to the segmental level. No evidence of pulmonary embolism. Normal heart size. No pericardial effusion. The aorta is normal in caliber. Mediastinum/Nodes: No mediastinal or axillary lymphadenopathy. Prominent lymph nodes are present in the hilar regions bilaterally and are likely reactive. Thyroid gland and trachea demonstrate no significant findings. There is suggested of mild diffuse thickening of the esophagus. Lungs/Pleura: There is bilateral bronchial wall thickening with bronchiectasis predominantly in the lower lobes bilaterally. Debris is present in the right lower lobe bronchioles with regions of mucous plugging versus possible aspirated material. Minimal atelectasis is  noted at the lung bases. No effusion or pneumothorax Upper Abdomen: Gastric wall thickening is noted. Musculoskeletal: A 1.1 cm nodule is noted in the subcutaneous fat in the anterior chest wall on the left, axial image 132. No acute osseous abnormality. Review of the MIP images confirms the above findings. IMPRESSION: 1. No evidence of pulmonary embolism. 2. Bilateral bronchial wall thickening with lower lobe bronchiectasis in debris in the distal lower lobe bronchioles bilaterally, possible  mucous plugging and/or aspirated material. 3. Thickening of the walls of the esophagus and gastric mucosa, suggesting esophagitis/gastritis. Consider endoscopy for further evaluation on follow-up. 4. 1.1 cm nodule in the subcutaneous fat of the anterior chest wall on the left. Correlation with physical exam is recommended. Electronically Signed   By: Brett Fairy M.D.   On: 08/11/2021 01:49   DG Chest Port 1 View  Result Date: 08/11/2021 CLINICAL DATA:  Shortness of breath. EXAM: PORTABLE CHEST 1 VIEW COMPARISON:  Chest radiograph dated 06/26/2021. FINDINGS: The heart size and mediastinal contours are within normal limits. Both lungs are clear. The visualized skeletal structures are unremarkable. IMPRESSION: No active disease. Electronically Signed   By: Anner Crete M.D.   On: 08/11/2021 00:01        Scheduled Meds:  amLODipine  10 mg Oral Daily   budesonide (PULMICORT) nebulizer solution  0.5 mg Nebulization BID   enoxaparin (LOVENOX) injection  40 mg Subcutaneous Q24H   guaiFENesin  1,200 mg Oral BID   ipratropium  0.5 mg Nebulization Q6H   levalbuterol  0.63 mg Nebulization Q6H   methylPREDNISolone (SOLU-MEDROL) injection  40 mg Intravenous Q12H   pantoprazole (PROTONIX) IV  40 mg Intravenous Q24H   potassium chloride  40 mEq Oral Q4H   Continuous Infusions:  ampicillin-sulbactam (UNASYN) IV 3 g (08/11/21 0858)   lactated ringers 75 mL/hr at 08/11/21 0605     LOS: 0 days    Time spent: ***    Irine Seal, MD [facility]   To contact the attending provider between 7A-7P or the covering provider during after hours 7P-7A, please log into the web site [email or web address] and access using universal [place] password for that web site. If you do not have the password, please call the hospital operator.  08/11/2021, 2:11 PM

## 2021-08-11 NOTE — H&P (Signed)
History and Physical    Susan Walton ERD:[id number] DOB: Jan 07, 1981 DOA: 08/10/2021  PCP: [facility]  Patient coming from: Home.  Chief Complaint: Nausea vomiting and diarrhea.  HPI: Susan Walton is a 40 y.o. female with history of hypertension depression polysubstance abuse anemia who has recently followed up with gastroenterologist for epigastric discomfort with intractable nausea vomiting has had EGD about 4 days ago which showed erythematous antrum and duodenopathy presents to the ER because of intractable nausea vomiting and diarrhea for the last few days.  Patient states she lost at least 20 pounds and unable to keep anything.  Admits to taking marijuana.  Patient states her last alcoholic drink was about 2 months ago.  Last evening she did have some shortness of breath which she attributed to allergy to her dogs.  Denies any chest pain.  ED Course: In the ER patient was given antiemetics and painkillers for the abdominal pain and vomiting but soon became short of breath tachycardic and hypoxic mostly on exertion.  CT angiogram of the chest done shows features concerning for bronchiectasis and aspiration mucous plugging.  Given the persistent tachycardia and hypoxia patient admitted for further observation.  Patient was given steroids and nebulizer for wheezing.  COVID test is negative.  EKG shows sinus tachycardia.  Other labs show hemoglobin which is at baseline LFTs are largely unremarkable.  Review of Systems: As per HPI, rest all negative.   Past Medical History:  Diagnosis Date   Alcoholism ([place])    no alcohol x 1 month per patient 06/2021   Anemia    Anxiety    Asthma attack 01/19/2021   Blind left eye    Depression    Fibroid    Hypertension    Retinal detachment     Past Surgical History:  Procedure Laterality Date   DILATION AND CURETTAGE OF UTERUS     EYE SURGERY Left    TUBAL LIGATION N/A 12/11/2017   Procedure: POST PARTUM TUBAL  LIGATION;  Surgeon: [name], MD;  Location: [facility];  Service: Gynecology;  Laterality: N/A;     reports that she has been smoking cigarettes. She has been smoking an average of .5 packs per day. She has never used smokeless tobacco. She reports current alcohol use. She reports that she does not currently use drugs.  Allergies  Allergen Reactions   Other Other (See Comments)    Unnamed antibiotic- Bradycardia (received when hospitalized in May, 2022- received Rocephin, Azithromycin, and Augmentin)    Family History  Problem Relation Age of Onset   Hypertension Mother    Hypertension Father    COPD Maternal Grandfather    Liver disease Paternal Grandmother    Liver disease Paternal Grandfather     Prior to Admission medications   Medication Sig Start Date End Date Taking? Authorizing Provider  acetaminophen (TYLENOL) 500 MG tablet Take 1,000 mg by mouth every 3 (three) hours as needed (for severe pain). Patient not taking: Reported on 08/07/2021    [provider]  albuterol (VENTOLIN HFA) 108 (90 Base) MCG/ACT inhaler Inhale 1-2 puffs into the lungs every 6 (six) hours as needed for wheezing or shortness of breath. 11/13/20   Chase Picket, MD  amLODipine (NORVASC) 10 MG tablet Take 1 tablet (10 mg total) by mouth daily. Patient not taking: Reported on 08/07/2021 01/22/21   Little Ishikawa, MD  calcium carbonate (TUMS EX) 750 MG chewable tablet Chew 1-3 tablets by mouth as needed  for heartburn.    [provider]  carvedilol (COREG) 6.25 MG tablet Take 1 tablet (6.25 mg total) by mouth 2 (two) times daily with a meal. Patient not taking: Reported on 08/07/2021 01/21/21   Little Ishikawa, MD  dicyclomine (BENTYL) 10 MG capsule Take 1 capsule (10 mg total) by mouth 3 (three) times daily as needed for spasms (Abdominal pain/Crampingh). 08/01/21   Esterwood, Amy S, PA-C  Dupilumab 300 MG/2ML SOPN Inject 2 mLs into the skin every 14 (fourteen)  days. Patient not taking: Reported on 08/01/2021 07/31/21   [provider]  fluticasone (FLONASE) 50 MCG/ACT nasal spray Place 2 sprays into both nostrils daily. 09/12/20   [provider]  hydrOXYzine (VISTARIL) 25 MG capsule Take 25 mg by mouth 3 (three) times daily as needed for anxiety.    [provider]  LATUDA 80 MG TABS tablet Take 80 mg by mouth daily with breakfast.    [provider]  ondansetron (ZOFRAN) 4 MG tablet Take 1 tablet (4 mg total) by mouth every 8 (eight) hours as needed for nausea or vomiting. 08/01/21   Esterwood, Amy S, PA-C  pantoprazole (PROTONIX) 20 MG tablet Take 1 tablet (20 mg total) by mouth daily. Patient not taking: Reported on 08/01/2021 07/15/21   Domenic Moras, PA-C  prazosin (MINIPRESS) 2 MG capsule Take 2 mg by mouth at bedtime. Patient not taking: Reported on 08/01/2021    [provider]  PROAIR HFA [phone] Base) MCG/ACT inhaler Inhale 2 puffs into the lungs every 2 (two) hours as needed for shortness of breath.    [provider]  sodium chloride (OCEAN) 0.65 % SOLN nasal spray Place 1 spray into both nostrils as needed for congestion. 04/01/21   Charlesetta Shanks, MD  labetalol (NORMODYNE) 100 MG tablet Take 1 tablet (100 mg total) by mouth 2 (two) times daily. 05/24/19 11/13/20  Jaynee Eagles, PA-C  NIFEdipine (PROCARDIA-XL/ADALAT CC) 60 MG 24 hr tablet Take 1 tablet (60 mg total) by mouth 2 (two) times daily. Patient not taking: Reported on 03/28/2019 12/13/17 05/24/19  Osborne Oman, MD  omeprazole (PRILOSEC) 40 MG capsule Take 1 capsule (40 mg total) by mouth daily. 30 minutes before breakfast Patient not taking: Reported on 03/28/2019 05/12/18 05/24/19  Mauri Pole, MD    Physical Exam: Constitutional: Moderately built and nourished. Vitals:   08/11/21 0330 08/11/21 0400 08/11/21 0500 08/11/21 0536  BP: 95/76 106/88 105/76 101/82  Pulse: (!) 118 (!) 118 (!) 108 (!) 106  Resp: 14 16 17 14   Temp:   98 F (36.7 C) 97.9 F (36.6 C)   TempSrc:  Oral Oral Oral  SpO2: 94% 92% 96% 100%  Weight:      Height:       Eyes: Anicteric no pallor. ENMT: No discharge from the ears eyes nose and mouth. Neck: No mass felt.  No neck rigidity. Respiratory: Bilateral expiratory wheeze and no crepitations. Cardiovascular: S1-S2 heard. Abdomen: Soft nontender bowel sound present. Musculoskeletal: No edema. Skin: No rash. Neurologic: Alert awake oriented to time place and person.  Moves all extremities. Psychiatric: Appears normal.  Normal affect.   Labs on Admission: I have personally reviewed following labs and imaging studies  CBC: Recent Labs  Lab 08/10/21 2057  WBC 6.1  NEUTROABS 2.4  HGB 10.6*  HCT 35.2*  MCV 71.7*  PLT 193   Basic Metabolic Panel: Recent Labs  Lab 08/10/21 2057  NA 137  K 3.5  CL 101  CO2 26  GLUCOSE 95  BUN 10  CREATININE 0.66  CALCIUM 8.8*   GFR: Estimated Creatinine Clearance: 90.9 mL/min (by C-G formula based on SCr of 0.66 mg/dL). Liver Function Tests: Recent Labs  Lab 08/10/21 2057  AST 24  ALT 5  ALKPHOS 30*  BILITOT 1.1  PROT 7.0  ALBUMIN 3.9   Recent Labs  Lab 08/10/21 2057  LIPASE 29   No results for input(s): AMMONIA in the last 168 hours. Coagulation Profile: No results for input(s): INR, PROTIME in the last 168 hours. Cardiac Enzymes: No results for input(s): CKTOTAL, CKMB, CKMBINDEX, TROPONINI in the last 168 hours. BNP (last 3 results) No results for input(s): PROBNP in the last 8760 hours. HbA1C: No results for input(s): HGBA1C in the last 72 hours. CBG: No results for input(s): GLUCAP in the last 168 hours. Lipid Profile: No results for input(s): CHOL, HDL, LDLCALC, TRIG, CHOLHDL, LDLDIRECT in the last 72 hours. Thyroid Function Tests: No results for input(s): TSH, T4TOTAL, FREET4, T3FREE, THYROIDAB in the last 72 hours. Anemia Panel: No results for input(s): VITAMINB12, FOLATE, FERRITIN, TIBC, IRON, RETICCTPCT  in the last 72 hours. Urine analysis:    Component Value Date/Time   COLORURINE YELLOW 06/30/2021 1359   APPEARANCEUR HAZY (A) 06/30/2021 1359   LABSPEC 1.031 (H) 06/30/2021 1359   PHURINE 5.0 06/30/2021 1359   GLUCOSEU NEGATIVE 06/30/2021 1359   HGBUR SMALL (A) 06/30/2021 1359   BILIRUBINUR NEGATIVE 06/30/2021 1359   KETONESUR 20 (A) 06/30/2021 1359   PROTEINUR 30 (A) 06/30/2021 1359   UROBILINOGEN 0.2 08/27/2020 0925   NITRITE NEGATIVE 06/30/2021 1359   LEUKOCYTESUR TRACE (A) 06/30/2021 1359   Sepsis Labs: @LABRCNTIP (procalcitonin:4,lacticidven:4) ) Recent Results (from the past 240 hour(s))  Resp Panel by RT-PCR (Flu A&B, Covid) Nasopharyngeal Swab     Status: None   Collection Time: 08/11/21  4:01 AM   Specimen: Nasopharyngeal Swab; Nasopharyngeal(NP) swabs in vial transport medium  Result Value Ref Range Status   SARS Coronavirus 2 by RT PCR NEGATIVE NEGATIVE Final    Comment: (NOTE) SARS-CoV-2 target nucleic acids are NOT DETECTED.  The SARS-CoV-2 RNA is generally detectable in upper respiratory specimens during the acute phase of infection. The lowest concentration of SARS-CoV-2 viral copies this assay can detect is 138 copies/mL. A negative result does not preclude SARS-Cov-2 infection and should not be used as the sole basis for treatment or other patient management decisions. A negative result may occur with  improper specimen collection/handling, submission of specimen other than nasopharyngeal swab, presence of viral mutation(s) within the areas targeted by this assay, and inadequate number of viral copies(<138 copies/mL). A negative result must be combined with clinical observations, patient history, and epidemiological information. The expected result is Negative.  Fact Sheet for Patients:  EntrepreneurPulse.com.au  Fact Sheet for Healthcare Providers:  IncredibleEmployment.be  This test is no t yet approved or cleared  by the [place] FDA and  has been authorized for detection and/or diagnosis of SARS-CoV-2 by FDA under an Emergency Use Authorization (EUA). This EUA will remain  in effect (meaning this test can be used) for the duration of the COVID-19 declaration under Section 564(b)(1) of the Act, 21 U.S.C.section 360bbb-3(b)(1), unless the authorization is terminated  or revoked sooner.       Influenza A by PCR NEGATIVE NEGATIVE Final   Influenza B by PCR NEGATIVE NEGATIVE Final    Comment: (NOTE) The Xpert Xpress SARS-CoV-2/FLU/RSV plus assay is intended as an aid in the diagnosis of influenza  from Nasopharyngeal swab specimens and should not be used as a sole basis for treatment. Nasal washings and aspirates are unacceptable for Xpert Xpress SARS-CoV-2/FLU/RSV testing.  Fact Sheet for Patients: EntrepreneurPulse.com.au  Fact Sheet for Healthcare Providers: IncredibleEmployment.be  This test is not yet approved or cleared by the [place] FDA and has been authorized for detection and/or diagnosis of SARS-CoV-2 by FDA under an Emergency Use Authorization (EUA). This EUA will remain in effect (meaning this test can be used) for the duration of the COVID-19 declaration under Section 564(b)(1) of the Act, 21 U.S.C. section 360bbb-3(b)(1), unless the authorization is terminated or revoked.  Performed at [facility], [address], [place], [place] [address]      Radiological Exams on Admission: CT Angio Chest PE W and/or Wo Contrast  Result Date: 08/11/2021 CLINICAL DATA:  Hypoxia. EXAM: CT ANGIOGRAPHY CHEST WITH CONTRAST TECHNIQUE: Multidetector CT imaging of the chest was performed using the standard protocol during bolus administration of intravenous contrast. Multiplanar CT image reconstructions and MIPs were obtained to evaluate the vascular anatomy. CONTRAST:  22mL OMNIPAQUE IOHEXOL 350 MG/ML SOLN COMPARISON:   01/19/2021. FINDINGS: Cardiovascular: Satisfactory opacification of the pulmonary arteries to the segmental level. No evidence of pulmonary embolism. Normal heart size. No pericardial effusion. The aorta is normal in caliber. Mediastinum/Nodes: No mediastinal or axillary lymphadenopathy. Prominent lymph nodes are present in the hilar regions bilaterally and are likely reactive. Thyroid gland and trachea demonstrate no significant findings. There is suggested of mild diffuse thickening of the esophagus. Lungs/Pleura: There is bilateral bronchial wall thickening with bronchiectasis predominantly in the lower lobes bilaterally. Debris is present in the right lower lobe bronchioles with regions of mucous plugging versus possible aspirated material. Minimal atelectasis is noted at the lung bases. No effusion or pneumothorax Upper Abdomen: Gastric wall thickening is noted. Musculoskeletal: A 1.1 cm nodule is noted in the subcutaneous fat in the anterior chest wall on the left, axial image 132. No acute osseous abnormality. Review of the MIP images confirms the above findings. IMPRESSION: 1. No evidence of pulmonary embolism. 2. Bilateral bronchial wall thickening with lower lobe bronchiectasis in debris in the distal lower lobe bronchioles bilaterally, possible mucous plugging and/or aspirated material. 3. Thickening of the walls of the esophagus and gastric mucosa, suggesting esophagitis/gastritis. Consider endoscopy for further evaluation on follow-up. 4. 1.1 cm nodule in the subcutaneous fat of the anterior chest wall on the left. Correlation with physical exam is recommended. Electronically Signed   By: Brett Fairy M.D.   On: 08/11/2021 01:49   DG Chest Port 1 View  Result Date: 08/11/2021 CLINICAL DATA:  Shortness of breath. EXAM: PORTABLE CHEST 1 VIEW COMPARISON:  Chest radiograph dated 06/26/2021. FINDINGS: The heart size and mediastinal contours are within normal limits. Both lungs are clear. The visualized  skeletal structures are unremarkable. IMPRESSION: No active disease. Electronically Signed   By: Anner Crete M.D.   On: 08/11/2021 00:01    EKG: Independently reviewed.  Sinus tachycardia.  Assessment/Plan Principal Problem:   Dyspnea Active Problems:   Asthma   Acute respiratory failure with hypoxia (HCC)   Nausea and vomiting   Acute respiratory failure with hypoxemia (HCC)    Acute respiratory failure with hypoxia presently requiring 2 L oxygen patient was easily desaturating in the ER with oxygen saturating going as low as 83% particular on exertion and on exam patient does have bilateral expiratory wheeze and CT scan of the chest does show features concerning for aspiration and  also bronchiectasis and mucous plugging.  For now I kept patient on steroids nebulizer Pulmicort and Unasyn.  Closely monitor. Intractable nausea vomiting and diarrhea being followed by [facility].  Has had recent EGD which showed erythematous antrum and erythematous duodenopathy.  Of note patient is also having diarrhea.  Patient's abdomen on exam appears benign and LFTs are normal.  We will check stool studies for GI pathogen.  Will place patient on Zofran and Protonix.  Patient also was recently placed on Bentyl which patient states makes it difficult to urinate so patient has not taken it anymore.  Follow LFTs and also clinical symptoms. Marijuana abuse -patient admits to taking marijuana advised about quitting.  Drug screen pending. History of hypertension patient states he takes amlodipine 10 mg.  Which has been ordered.  The dose was reconfirmed with patient. History of PTSD depression anxiety presently not on any medication denies any suicidal thoughts. Anemia appears to be chronic we will follow CBC check anemia panel.   DVT prophylaxis: Lovenox. Code Status: Full code. Family Communication: Discussed with patient. Disposition Plan: Home. Consults called: None. Admission status:  Observation.   [name] MD [facility] Pager [phone].  If 7PM-7AM, please contact night-coverage [email or web address] Password [facility]  08/11/2021, 5:46 AM

## 2021-08-11 NOTE — Progress Notes (Signed)
PT confirms that she understands how and when to utilize Flutter.

## 2021-08-12 DIAGNOSIS — J470 Bronchiectasis with acute lower respiratory infection: Secondary | ICD-10-CM | POA: Diagnosis not present

## 2021-08-12 DIAGNOSIS — J9601 Acute respiratory failure with hypoxia: Secondary | ICD-10-CM | POA: Diagnosis not present

## 2021-08-12 DIAGNOSIS — J690 Pneumonitis due to inhalation of food and vomit: Secondary | ICD-10-CM | POA: Diagnosis not present

## 2021-08-12 DIAGNOSIS — J479 Bronchiectasis, uncomplicated: Secondary | ICD-10-CM

## 2021-08-12 DIAGNOSIS — F32A Depression, unspecified: Secondary | ICD-10-CM | POA: Diagnosis not present

## 2021-08-12 DIAGNOSIS — F332 Major depressive disorder, recurrent severe without psychotic features: Secondary | ICD-10-CM

## 2021-08-12 DIAGNOSIS — F411 Generalized anxiety disorder: Secondary | ICD-10-CM

## 2021-08-12 DIAGNOSIS — F4310 Post-traumatic stress disorder, unspecified: Secondary | ICD-10-CM

## 2021-08-12 LAB — BASIC METABOLIC PANEL
Anion gap: 7 (ref 5–15)
BUN: 8 mg/dL (ref 6–20)
CO2: 26 mmol/L (ref 22–32)
Calcium: 8.6 mg/dL — ABNORMAL LOW (ref 8.9–10.3)
Chloride: 105 mmol/L (ref 98–111)
Creatinine, Ser: 0.46 mg/dL (ref 0.44–1.00)
GFR, Estimated: >60 mL/min (ref >60)
Glucose, Bld: 112 mg/dL — ABNORMAL HIGH (ref 70–99)
Potassium: 4.1 mmol/L (ref 3.5–5.1)
Sodium: 138 mmol/L (ref 135–145)

## 2021-08-12 LAB — CBC WITH DIFFERENTIAL/PLATELET
Abs Immature Granulocytes: 0.02 10*3/uL (ref 0.00–0.07)
Basophils Absolute: 0 10*3/uL (ref 0.0–0.1)
Basophils Relative: 0 %
Eosinophils Absolute: 0 10*3/uL (ref 0.0–0.5)
Eosinophils Relative: 0 %
HCT: 26.1 % — ABNORMAL LOW (ref 36.0–46.0)
Hemoglobin: 8.2 g/dL — ABNORMAL LOW (ref 12.0–15.0)
Immature Granulocytes: 0 %
Lymphocytes Relative: 14 %
Lymphs Abs: 0.8 10*3/uL (ref 0.7–4.0)
MCH: 22.4 pg — ABNORMAL LOW (ref 26.0–34.0)
MCHC: 31.4 g/dL (ref 30.0–36.0)
MCV: 71.3 fL — ABNORMAL LOW (ref 80.0–100.0)
Monocytes Absolute: 0.3 10*3/uL (ref 0.1–1.0)
Monocytes Relative: 5 %
Neutro Abs: 4.7 10*3/uL (ref 1.7–7.7)
Neutrophils Relative %: 81 %
Platelets: 342 10*3/uL (ref 150–400)
RBC: 3.66 MIL/uL — ABNORMAL LOW (ref 3.87–5.11)
RDW: 16.7 % — ABNORMAL HIGH (ref 11.5–15.5)
WBC: 5.8 10*3/uL (ref 4.0–10.5)
nRBC: 0 % (ref 0.0–0.2)

## 2021-08-12 LAB — GASTROINTESTINAL PANEL BY PCR, STOOL (REPLACES STOOL CULTURE)

## 2021-08-12 LAB — MAGNESIUM: Magnesium: 1.9 mg/dL (ref 1.7–2.4)

## 2021-08-12 MED ORDER — CALCIUM CARBONATE ANTACID 750 MG PO CHEW: 750.0000 | CHEWABLE_TABLET | Freq: Every day | ORAL | Status: AC | PRN

## 2021-08-12 MED ORDER — AMOXICILLIN-POT CLAVULANATE 875-125 MG PO TABS
1.0000 | ORAL_TABLET | Freq: Two times a day (BID) | ORAL | Status: DC
  Administered 2021-08-12: 09:00:00 1 via ORAL
  Filled 2021-08-12: qty 1

## 2021-08-12 MED ORDER — LEVALBUTEROL HCL 0.63 MG/3ML IN NEBU: 0.6300 mg | INHALATION_SOLUTION | Freq: Two times a day (BID) | RESPIRATORY_TRACT | Status: DC

## 2021-08-12 MED ORDER — ALBUTEROL SULFATE HFA 108 (90 BASE) MCG/ACT IN AERS: 1.0000 | INHALATION_SPRAY | Freq: Four times a day (QID) | RESPIRATORY_TRACT | 0 refills | Status: DC | PRN

## 2021-08-12 MED ORDER — AMOXICILLIN-POT CLAVULANATE 875-125 MG PO TABS: 1.0000 | ORAL_TABLET | Freq: Two times a day (BID) | ORAL | 0 refills | Status: AC

## 2021-08-12 MED ORDER — SIMETHICONE 80 MG PO CHEW: 160.0000 mg | CHEWABLE_TABLET | Freq: Four times a day (QID) | ORAL | 0 refills | Status: DC | PRN

## 2021-08-12 MED ORDER — MOMETASONE FURO-FORMOTEROL FUM 200-5 MCG/ACT IN AERO
2.0000 | INHALATION_SPRAY | Freq: Two times a day (BID) | RESPIRATORY_TRACT | Status: DC
  Filled 2021-08-12: qty 8.8

## 2021-08-12 MED ORDER — GUAIFENESIN ER 600 MG PO TB12: 1200.0000 mg | ORAL_TABLET | Freq: Two times a day (BID) | ORAL | 1 refills | Status: AC

## 2021-08-12 MED ORDER — IPRATROPIUM BROMIDE 0.02 % IN SOLN: 0.5000 mg | Freq: Two times a day (BID) | RESPIRATORY_TRACT | Status: DC

## 2021-08-12 MED ORDER — POLYSACCHARIDE IRON COMPLEX 150 MG PO CAPS
150.0000 mg | ORAL_CAPSULE | Freq: Every day | ORAL | Status: DC
  Filled 2021-08-12: qty 1

## 2021-08-12 MED ORDER — PANTOPRAZOLE SODIUM 40 MG PO TBEC
40.0000 mg | DELAYED_RELEASE_TABLET | Freq: Every day | ORAL | Status: DC
  Administered 2021-08-12: 09:00:00 40 mg via ORAL
  Filled 2021-08-12: qty 1

## 2021-08-12 MED ORDER — PANTOPRAZOLE SODIUM 40 MG PO TBEC: 40.0000 mg | DELAYED_RELEASE_TABLET | Freq: Every day | ORAL | 1 refills | Status: DC

## 2021-08-12 MED ORDER — PREDNISONE 20 MG PO TABS: ORAL_TABLET | ORAL | 0 refills | Status: AC

## 2021-08-12 MED ORDER — POLYSACCHARIDE IRON COMPLEX 150 MG PO CAPS
150.0000 mg | ORAL_CAPSULE | Freq: Every day | ORAL | 1 refills | Status: AC
Start: 2021-08-12 — End: ?

## 2021-08-12 MED ORDER — ALBUTEROL SULFATE HFA 108 (90 BASE) MCG/ACT IN AERS
2.0000 | INHALATION_SPRAY | RESPIRATORY_TRACT | Status: DC | PRN
  Filled 2021-08-12: qty 6.7

## 2021-08-12 MED ORDER — PREDNISONE 20 MG PO TABS
40.0000 mg | ORAL_TABLET | Freq: Every day | ORAL | Status: DC
  Filled 2021-08-12: qty 2

## 2021-08-12 NOTE — Discharge Summary (Signed)
Physician Discharge Summary  Susan Walton HDQ:[id number] DOB: [birth year] DOA: 08/10/2021  PCP: [facility]  Admit date: 08/10/2021 Discharge date: 08/12/2021  Time spent: 55 minutes  Recommendations for Outpatient Follow-up:  Follow-up with [facility] in 1 to 2 weeks.  On follow-up patient will need a basic metabolic profile done to follow-up on electrolytes and renal function.  CBC with will need to be done to follow-up on iron deficiency anemia.    Discharge Diagnoses:  Principal Problem:   Acute respiratory failure with hypoxia (HCC) Active Problems:   Aspiration pneumonia (HCC)   Bronchiectasis (HCC)   Asthma   PTSD (post-traumatic stress disorder)   GAD (generalized anxiety disorder)   Depression   MDD (major depressive disorder), recurrent severe, without psychosis ([place])   Dyspnea   Nausea and vomiting   Discharge Condition: Stable and improved.  Diet recommendation: Soft diet for the next 4 to 5 days, then advance to regular diet if tolerated.  Filed Weights   08/10/21 1528  Weight: 70.8 kg    History of present illness:  HPI per Dr. Lacinda Axon is a 40 y.o. female with history of hypertension depression polysubstance abuse anemia who has recently followed up with gastroenterologist for epigastric discomfort with intractable nausea vomiting has had EGD about 4 days ago which showed erythematous antrum and duodenopathy presents to the ER because of intractable nausea vomiting and diarrhea for the last few days.  Patient states she lost at least 20 pounds and unable to keep anything.  Admits to taking marijuana.  Patient stated her last alcoholic drink was about 2 months ago.  Last evening she did have some shortness of breath which she attributed to allergy to her dogs.  Denies any chest pain.   ED Course: In the ER patient was given antiemetics and painkillers for the abdominal pain and vomiting but soon  became short of breath tachycardic and hypoxic mostly on exertion.  CT angiogram of the chest done shows features concerning for bronchiectasis and aspiration mucous plugging.  Given the persistent tachycardia and hypoxia patient admitted for further observation.  Patient was given steroids and nebulizer for wheezing.  COVID test is negative.  EKG shows sinus tachycardia.  Other labs show hemoglobin which is at baseline LFTs are largely unremarkable.   Hospital Course:  #1 acute respiratory failure with hypoxia likely secondary to probable aspiration pneumonia, bronchiectasis with mucous plugging -Patient noted on admission to be hypoxic requiring 2 L O2 patient noted to desat in the ED with sats going as low as 83% on exertion. -Patient noted to have some bilateral wheezing on exam and CT chest done concerning for aspiration bronchiectasis as well as mucous plugging. -Patient placed on steroids, Pulmicort, Unasyn, scheduled duo nebs, Mucinex twice daily, Claritin, Flonase and chest PT. -Patient improved clinically and was satting 100% on room air by day of discharge. -Patient be discharged on 5 days of Mucinex, 5 days of scheduled albuterol and then every 6 hours as needed, 5 days of Augmentin, Claritin and Flonase as well as PPI. -Outpatient follow-up with PCP.  2.  Hypokalemia -Secondary to GI losses from nausea vomiting diarrhea. -Repleted during the hospitalization. -Outpatient follow-up.  3.  Intractable nausea vomiting diarrhea -Being followed by the bowel GI.  Patient noted to have recent EGD that showed erythematous antrum, erythematous duodenopathy.  Patient noted to have diarrhea. -Abdominal exam was benign, LFTs normal.  Stool studies were obtained however diarrhea had resolved with no  further diarrhea during the hospitalization. -Patient placed on PPI, diet advanced to full liquid diet which she tolerated and subsequently to a soft diet which she tolerated. -Nausea, vomiting,  diarrhea had resolved by day of discharge. -Patient with discharge on Protonix 40 mg daily. -Outpatient follow-up with GI as previously scheduled.  4.  Marijuana abuse -Marijuana cessation stressed to patient.  5.  Hypertension -Patient maintained on home regimen Norvasc 10 mg daily.  6.  History of PTSD/depression/anxiety -Patient with no suicidal ideation. -Remained stable.  7.  Iron deficiency anemia -Patient with no overt bleeding. -Patient started on oral iron supplementation daily. -Outpatient follow-up with PCP.  Procedures: CT angiogram chest 08/11/2021 Chest x-ray 08/10/2021  Consultations: None  Discharge Exam: Vitals:   08/12/21 0330 08/12/21 0735  BP: 129/75   Pulse: 79   Resp: 16   Temp: 97.9 F (36.6 C)   SpO2: 99% 100%    General: NAD. Cardiovascular: RRR no murmurs rubs or gallops.  No JVD.  No lower extremity edema. Respiratory: Clear to auscultation bilaterally.  No wheezes, no crackles, no rhonchi.  Fair air movement.  Speaking in full sentences.  No use of accessory muscles of respiration.  Discharge Instructions   Discharge Instructions     Diet general   Complete by: As directed    Soft diet x4 to 5 days, then may advance to a regular diet.   Increase activity slowly   Complete by: As directed       Allergies as of 08/12/2021       Reactions   Other Other (See Comments)   Unnamed antibiotic- Bradycardia (received when hospitalized in May, 2022- received Rocephin, Azithromycin, and Augmentin)        Medication List     STOP taking these medications    carvedilol 6.25 MG tablet Commonly known as: COREG       TAKE these medications    acetaminophen 500 MG tablet Commonly known as: TYLENOL Take 1,000 mg by mouth every 4 (four) hours as needed for moderate pain, mild pain or headache.   albuterol 108 (90 Base) MCG/ACT inhaler Commonly known as: VENTOLIN HFA Inhale 1-2 puffs into the lungs every 6 (six) hours as needed  for wheezing or shortness of breath. Use 2 puffs 3 times daily x5 days, then every 6 hours as needed. What changed: additional instructions   amLODipine 10 MG tablet Commonly known as: NORVASC Take 1 tablet (10 mg total) by mouth daily.   amoxicillin-clavulanate 875-125 MG tablet Commonly known as: AUGMENTIN Take 1 tablet by mouth every 12 (twelve) hours for 5 days.   calcium carbonate 750 MG chewable tablet Commonly known as: TUMS EX Chew 750-2,250 tablets (562,500-1,687,500 mg total) by mouth daily as needed for heartburn. What changed: when to take this   dicyclomine 10 MG capsule Commonly known as: BENTYL Take 1 capsule (10 mg total) by mouth 3 (three) times daily as needed for spasms (Abdominal pain/Crampingh). What changed: reasons to take this   fluticasone 50 MCG/ACT nasal spray Commonly known as: FLONASE Place 2 sprays into both nostrils daily as needed for allergies.   guaiFENesin 600 MG 12 hr tablet Commonly known as: MUCINEX Take 2 tablets (1,200 mg total) by mouth 2 (two) times daily for 5 days.   iron polysaccharides 150 MG capsule Commonly known as: NIFEREX Take 1 capsule (150 mg total) by mouth daily.   ondansetron 4 MG tablet Commonly known as: ZOFRAN Take 1 tablet (4 mg total) by mouth every  8 (eight) hours as needed for nausea or vomiting.   pantoprazole 40 MG tablet Commonly known as: PROTONIX Take 1 tablet (40 mg total) by mouth daily. What changed:  medication strength how much to take   predniSONE 20 MG tablet Commonly known as: DELTASONE Take 2 tablets (40 mg total) by mouth daily before breakfast for 3 days, THEN 1 tablet (20 mg total) daily before breakfast for 3 days. Start taking on: August 13, 2021   simethicone 80 MG chewable tablet Commonly known as: MYLICON Chew 2 tablets (160 mg total) by mouth 4 (four) times daily as needed for flatulence.   sodium chloride 0.65 % Soln nasal spray Commonly known as: OCEAN Place 1 spray into  both nostrils as needed for congestion. What changed: when to take this       Allergies  Allergen Reactions   Other Other (See Comments)    Unnamed antibiotic- Bradycardia (received when hospitalized in May, 2022- received Rocephin, Azithromycin, and Augmentin)    Follow-up Information     [facility]. Schedule an appointment as soon as possible for a visit in 1 week(s).   Specialty: Family Medicine Why: f/u in 1-2 weeks Contact information: [place]  [address] [phone]                  The results of significant diagnostics from this hospitalization (including imaging, microbiology, ancillary and laboratory) are listed below for reference.    Significant Diagnostic Studies: CT Angio Chest PE W and/or Wo Contrast  Result Date: 08/11/2021 CLINICAL DATA:  Hypoxia. EXAM: CT ANGIOGRAPHY CHEST WITH CONTRAST TECHNIQUE: Multidetector CT imaging of the chest was performed using the standard protocol during bolus administration of intravenous contrast. Multiplanar CT image reconstructions and MIPs were obtained to evaluate the vascular anatomy. CONTRAST:  70mL OMNIPAQUE IOHEXOL 350 MG/ML SOLN COMPARISON:  01/19/2021. FINDINGS: Cardiovascular: Satisfactory opacification of the pulmonary arteries to the segmental level. No evidence of pulmonary embolism. Normal heart size. No pericardial effusion. The aorta is normal in caliber. Mediastinum/Nodes: No mediastinal or axillary lymphadenopathy. Prominent lymph nodes are present in the hilar regions bilaterally and are likely reactive. Thyroid gland and trachea demonstrate no significant findings. There is suggested of mild diffuse thickening of the esophagus. Lungs/Pleura: There is bilateral bronchial wall thickening with bronchiectasis predominantly in the lower lobes bilaterally. Debris is present in the right lower lobe bronchioles with regions of mucous plugging versus possible aspirated  material. Minimal atelectasis is noted at the lung bases. No effusion or pneumothorax Upper Abdomen: Gastric wall thickening is noted. Musculoskeletal: A 1.1 cm nodule is noted in the subcutaneous fat in the anterior chest wall on the left, axial image 132. No acute osseous abnormality. Review of the MIP images confirms the above findings. IMPRESSION: 1. No evidence of pulmonary embolism. 2. Bilateral bronchial wall thickening with lower lobe bronchiectasis in debris in the distal lower lobe bronchioles bilaterally, possible mucous plugging and/or aspirated material. 3. Thickening of the walls of the esophagus and gastric mucosa, suggesting esophagitis/gastritis. Consider endoscopy for further evaluation on follow-up. 4. 1.1 cm nodule in the subcutaneous fat of the anterior chest wall on the left. Correlation with physical exam is recommended. Electronically Signed   By: Brett Fairy M.D.   On: 08/11/2021 01:49   DG Chest Port 1 View  Result Date: 08/11/2021 CLINICAL DATA:  Shortness of breath. EXAM: PORTABLE CHEST 1 VIEW COMPARISON:  Chest radiograph dated 06/26/2021. FINDINGS: The heart size and mediastinal contours  are within normal limits. Both lungs are clear. The visualized skeletal structures are unremarkable. IMPRESSION: No active disease. Electronically Signed   By: Anner Crete M.D.   On: 08/11/2021 00:01    Microbiology: Recent Results (from the past 240 hour(s))  Resp Panel by RT-PCR (Flu A&B, Covid) Nasopharyngeal Swab     Status: None   Collection Time: 08/11/21  4:01 AM   Specimen: Nasopharyngeal Swab; Nasopharyngeal(NP) swabs in vial transport medium  Result Value Ref Range Status   SARS Coronavirus 2 by RT PCR NEGATIVE NEGATIVE Final    Comment: (NOTE) SARS-CoV-2 target nucleic acids are NOT DETECTED.  The SARS-CoV-2 RNA is generally detectable in upper respiratory specimens during the acute phase of infection. The lowest concentration of SARS-CoV-2 viral copies this assay  can detect is 138 copies/mL. A negative result does not preclude SARS-Cov-2 infection and should not be used as the sole basis for treatment or other patient management decisions. A negative result may occur with  improper specimen collection/handling, submission of specimen other than nasopharyngeal swab, presence of viral mutation(s) within the areas targeted by this assay, and inadequate number of viral copies(<138 copies/mL). A negative result must be combined with clinical observations, patient history, and epidemiological information. The expected result is Negative.  Fact Sheet for Patients:  EntrepreneurPulse.com.au  Fact Sheet for Healthcare Providers:  IncredibleEmployment.be  This test is no t yet approved or cleared by the [place] FDA and  has been authorized for detection and/or diagnosis of SARS-CoV-2 by FDA under an Emergency Use Authorization (EUA). This EUA will remain  in effect (meaning this test can be used) for the duration of the COVID-19 declaration under Section 564(b)(1) of the Act, 21 U.S.C.section 360bbb-3(b)(1), unless the authorization is terminated  or revoked sooner.       Influenza A by PCR NEGATIVE NEGATIVE Final   Influenza B by PCR NEGATIVE NEGATIVE Final    Comment: (NOTE) The Xpert Xpress SARS-CoV-2/FLU/RSV plus assay is intended as an aid in the diagnosis of influenza from Nasopharyngeal swab specimens and should not be used as a sole basis for treatment. Nasal washings and aspirates are unacceptable for Xpert Xpress SARS-CoV-2/FLU/RSV testing.  Fact Sheet for Patients: EntrepreneurPulse.com.au  Fact Sheet for Healthcare Providers: IncredibleEmployment.be  This test is not yet approved or cleared by the [place] FDA and has been authorized for detection and/or diagnosis of SARS-CoV-2 by FDA under an Emergency Use Authorization (EUA). This EUA will  remain in effect (meaning this test can be used) for the duration of the COVID-19 declaration under Section 564(b)(1) of the Act, 21 U.S.C. section 360bbb-3(b)(1), unless the authorization is terminated or revoked.  Performed at [facility], [place] [address], [place], [place] [address]   C Difficile Quick Screen w PCR reflex     Status: None   Collection Time: 08/11/21  3:03 PM   Specimen: STOOL  Result Value Ref Range Status   C Diff antigen NEGATIVE NEGATIVE Final   C Diff toxin NEGATIVE NEGATIVE Final   C Diff interpretation No C. difficile detected.  Final    Comment: Performed at [facility], [place] [address], [place],  [address]     Labs: Basic Metabolic Panel: Recent Labs  Lab 08/10/21 2057 08/11/21 0606 08/12/21 0503  NA 137 135 138  K 3.5 2.9* 4.1  CL 101 98 105  CO2 26 24 26   GLUCOSE 95 120* 112*  BUN 10 12 8   CREATININE 0.66 0.79 0.46  CALCIUM 8.8* 9.1 8.6*  MG  --  1.9 1.9   Liver Function Tests: Recent Labs  Lab 08/10/21 2057 08/11/21 0606  AST 24 14*  ALT 5 8  ALKPHOS 30* 31*  BILITOT 1.1 0.8  PROT 7.0 7.0  ALBUMIN 3.9 3.9   Recent Labs  Lab 08/10/21 2057  LIPASE 29   No results for input(s): AMMONIA in the last 168 hours. CBC: Recent Labs  Lab 08/10/21 2057 08/11/21 0606 08/12/21 0604  WBC 6.1 9.3 5.8  NEUTROABS 2.4 8.3* 4.7  HGB 10.6* 12.5 8.2*  HCT 35.2* 41.8 26.1*  MCV 71.7* 73.7* 71.3*  PLT 328 444* 342   Cardiac Enzymes: No results for input(s): CKTOTAL, CKMB, CKMBINDEX, TROPONINI in the last 168 hours. BNP: BNP (last 3 results) Recent Labs    01/19/21 1145  BNP 20.8    ProBNP (last 3 results) No results for input(s): PROBNP in the last 8760 hours.  CBG: No results for input(s): GLUCAP in the last 168 hours.     Signed:  Irine Seal MD.  [facility] 08/12/2021, 8:50 AM

## 2021-08-12 NOTE — Progress Notes (Signed)
Patient will be discharging shortly. Belongings were returned. Education on medication will be provided.

## 2021-08-13 ENCOUNTER — Telehealth: Payer: Self-pay

## 2021-08-13 NOTE — Telephone Encounter (Signed)
  Follow up Call-  Call back number 08/07/2021  Post procedure Call Back phone  # [phone]  Permission to leave phone message Yes  Some recent data might be hidden     Patient questions:  Do you have a fever, pain , or abdominal swelling? No. Pain Score  0 *  Have you tolerated food without any problems? Yes.    Have you been able to return to your normal activities? Yes.    Do you have any questions about your discharge instructions: Diet   No. Medications  No. Follow up visit  No.  Do you have questions or concerns about your Care? No.  Actions: * If pain score is 4 or above: No action needed, pain <4.

## 2021-08-16 ENCOUNTER — Ambulatory Visit (HOSPITAL_COMMUNITY): Payer: Medicaid Other | Admitting: Licensed Clinical Social Worker

## 2021-08-16 ENCOUNTER — Telehealth (HOSPITAL_COMMUNITY): Payer: Self-pay | Admitting: Licensed Clinical Social Worker

## 2021-08-16 NOTE — Telephone Encounter (Signed)
LCSW sent 3 links to patient's phone 1 at 1103-second at 1107 and third at 1112 with no response.  Patient will be marked as a no call no-show for this appointment.

## 2021-08-23 DIAGNOSIS — K2950 Unspecified chronic gastritis without bleeding: Secondary | ICD-10-CM | POA: Diagnosis not present

## 2021-08-23 DIAGNOSIS — B9681 Helicobacter pylori [H. pylori] as the cause of diseases classified elsewhere: Secondary | ICD-10-CM | POA: Diagnosis not present

## 2021-08-31 ENCOUNTER — Encounter: Payer: Self-pay | Admitting: Internal Medicine

## 2021-09-03 ENCOUNTER — Other Ambulatory Visit: Payer: Self-pay

## 2021-09-03 ENCOUNTER — Telehealth: Payer: Self-pay

## 2021-09-03 DIAGNOSIS — A048 Other specified bacterial intestinal infections: Secondary | ICD-10-CM

## 2021-09-03 MED ORDER — BISMUTH SUBSALICYLATE 262 MG PO CHEW: 524.0000 mg | CHEWABLE_TABLET | Freq: Four times a day (QID) | ORAL | 0 refills | Status: AC

## 2021-09-03 MED ORDER — METRONIDAZOLE 250 MG PO TABS: 250.0000 mg | ORAL_TABLET | Freq: Four times a day (QID) | ORAL | 0 refills | Status: AC

## 2021-09-03 MED ORDER — PANTOPRAZOLE SODIUM 40 MG PO TBEC: 40.0000 mg | DELAYED_RELEASE_TABLET | Freq: Two times a day (BID) | ORAL | 0 refills | Status: DC

## 2021-09-03 MED ORDER — TETRACYCLINE HCL 500 MG PO CAPS: 500.0000 mg | ORAL_CAPSULE | Freq: Four times a day (QID) | ORAL | 0 refills | Status: DC

## 2021-09-03 NOTE — Telephone Encounter (Signed)
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 Recommend bismuth quadruple therapy for treatment: - Tetracycline 500 mg QID x 14 days - Flagyl 250 mg QID x 14 days - Bismuth subsalicylate 374 mg QID x 14 days - PPI BID x 14 days  About 4 weeks after completion of therapy, patient will need to be checked for eradication with a stool H pylori antigen. PPI therapy will need to be held 2 weeks prior to checking for eradication.

## 2021-09-03 NOTE — Telephone Encounter (Signed)
Called pt and informed of results and recommendations as reviewed and documented by Dr. Lorenso Courier. Verbalized acceptance and understanding.  Rx's sent to pt preferred pharmacy, with the exception of [facility]. Pt aware she will need to get Pepto OTC. Orders have been placed for pt to submit stool specimen following tx't.

## 2021-09-10 ENCOUNTER — Other Ambulatory Visit: Payer: Self-pay

## 2021-09-10 ENCOUNTER — Telehealth: Payer: Self-pay

## 2021-09-10 DIAGNOSIS — A048 Other specified bacterial intestinal infections: Secondary | ICD-10-CM

## 2021-09-10 MED ORDER — DOXYCYCLINE HYCLATE 100 MG PO TABS: 100.0000 mg | ORAL_TABLET | Freq: Two times a day (BID) | ORAL | 0 refills | Status: AC

## 2021-09-10 NOTE — Telephone Encounter (Signed)
DENIAL  Medication: Tetracycline Insurance Company: [place] response: DENIED Rationale: Must have tried and failed Doxycycline  Dr. Lorenso Courier made aware of denial and advised Rx may be changed to covered alternative.

## 2021-09-17 ENCOUNTER — Ambulatory Visit (HOSPITAL_COMMUNITY): Payer: Medicaid Other | Admitting: Licensed Clinical Social Worker

## 2021-09-25 ENCOUNTER — Ambulatory Visit (HOSPITAL_COMMUNITY)
Admission: EM | Admit: 2021-09-25 | Discharge: 2021-09-25 | Disposition: A | Discharge status: Home / Self Care | Payer: Medicaid Other | Attending: Internal Medicine | Admitting: Internal Medicine

## 2021-09-25 ENCOUNTER — Other Ambulatory Visit: Payer: Self-pay

## 2021-09-25 ENCOUNTER — Encounter (HOSPITAL_COMMUNITY): Payer: Self-pay | Admitting: Emergency Medicine

## 2021-09-25 DIAGNOSIS — J069 Acute upper respiratory infection, unspecified: Secondary | ICD-10-CM

## 2021-09-25 DIAGNOSIS — B3731 Acute candidiasis of vulva and vagina: Secondary | ICD-10-CM

## 2021-09-25 DIAGNOSIS — J339 Nasal polyp, unspecified: Secondary | ICD-10-CM

## 2021-09-25 DIAGNOSIS — H66001 Acute suppurative otitis media without spontaneous rupture of ear drum, right ear: Secondary | ICD-10-CM

## 2021-09-25 MED ORDER — FLUCONAZOLE 150 MG PO TABS: 150.0000 mg | ORAL_TABLET | Freq: Every day | ORAL | 0 refills | Status: DC

## 2021-09-25 MED ORDER — PREDNISONE 20 MG PO TABS: 40.0000 mg | ORAL_TABLET | Freq: Every day | ORAL | 0 refills | Status: AC

## 2021-09-25 MED ORDER — DOXYCYCLINE HYCLATE 100 MG PO TABS: 100.0000 mg | ORAL_TABLET | Freq: Two times a day (BID) | ORAL | 0 refills | Status: AC

## 2021-09-25 MED ORDER — FLUTICASONE PROPIONATE 50 MCG/ACT NA SUSP: 1.0000 | Freq: Every day | NASAL | 0 refills | Status: DC

## 2021-09-25 NOTE — ED Triage Notes (Signed)
PT reports STI symptoms and nasal congestion.

## 2021-09-25 NOTE — Discharge Instructions (Addendum)
I am treating you today for an acute sinusitis.  Please start antibiotics and take until completed.  Prednisone daily x7 days as we discussed.  There is no need to taper the steroid.  Just take 40 mg daily until gone.  I have also prescribed Flonase since you have run out.  You can also buy this over-the-counter.  Diflucan prescribed for yeast infection.  Rest, drink plenty of fluids.  Keep follow-up appoint with ENT.  Follow-up sooner for any worsening or persistent symptoms.

## 2021-09-25 NOTE — ED Provider Notes (Signed)
[facility]    CSN: [id number] Arrival date & time: 09/25/21  1440      History   Chief Complaint Chief Complaint  Patient presents with   SEXUALLY TRANSMITTED DISEASE   Nasal Congestion    HPI Susan Walton is a 41 y.o. female with multiple medical problems including nasal polyps for which she follows with ENT.  She is due to have surgery and started Dupixent yesterday for polyps.  Patient also with recent H. pylori gastritis and 10/22.  Patient presents today with 2-week history of nasal congestion, headaches, rhinorrhea, coughing/sneezing.  Patient describes thick yellow mucus draining from nose, worse in a.m., occasional nausea.  She denies cough, sore throat, SOB, CP, vomiting, diarrhea.  Patient states she recently finished amoxicillin prescribed "for her stomach".  Chart review shows she was prescribed doxycycline in 12/22 for H. pylori.  Patient states she never took this medication.    Past Medical History:  Diagnosis Date   Alcoholism ([place])    no alcohol x 1 month per patient 06/2021   Anemia    Anxiety    Asthma attack 01/19/2021   Blind left eye    Depression    Fibroid    Helicobacter pylori gastritis    Hypertension    Retinal detachment     Patient Active Problem List   Diagnosis Date Noted   Bronchiectasis ([place]) 08/12/2021   Aspiration pneumonia ([place]) 08/12/2021   Dyspnea 08/11/2021   Nausea and vomiting 08/11/2021   Acute respiratory failure with hypoxemia ([place]) 08/11/2021   Depression 06/27/2021   MDD (major depressive disorder), recurrent severe, 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   Schizoaffective disorder ([place]) 06/10/2021   PTSD (post-traumatic stress disorder) 06/10/2021   GAD (generalized anxiety disorder) 06/10/2021   Acute respiratory failure with hypoxia ([place]) 01/20/2021   Asthma attack 01/19/2021   Asthma 01/19/2021   Group B Streptococcus carrier, +RV culture, currently pregnant 12/10/2017   Gonorrhea affecting pregnancy in  first trimester 12/10/2017   Depression affecting pregnancy in third trimester, antepartum 10/20/2017   Pregnancy, supervision, high-risk 07/27/2017   Chronic hypertension with superimposed severe preeclampsia 07/27/2017   Drug abuse during pregnancy ([place]) 07/27/2017   Thyroid dysfunction, antepartum 07/27/2017   History of trichomoniasis 07/27/2017   Unwanted fertility 07/27/2017   Advanced maternal age in multigravida, second trimester    Adjustment disorder with depressed mood 06/05/2017    Past Surgical History:  Procedure Laterality Date   DILATION AND CURETTAGE OF UTERUS     EYE SURGERY Left    TUBAL LIGATION N/A 12/11/2017   Procedure: POST PARTUM TUBAL LIGATION;  Surgeon: [name], MD;  Location: [facility];  Service: Gynecology;  Laterality: N/A;    OB History     Gravida  8   Para  5   Term  5   Preterm  0   AB  3   Living  5      SAB  2   IAB  1   Ectopic  0   Multiple  0   Live Births  5            Home Medications    Prior to Admission medications   Medication Sig Start Date End Date Taking? Authorizing Provider  doxycycline (VIBRA-TABS) 100 MG tablet Take 1 tablet (100 mg total) by mouth 2 (two) times daily for 10 days. 09/25/21 10/05/21 Yes Rudolpho Sevin, NP  fluconazole (DIFLUCAN) 150 MG tablet Take 1 tablet (150 mg total) by mouth  daily. Take x1.  Repeat in 3 days if symptoms persist 09/25/21  Yes Rudolpho Sevin, NP  fluticasone [facility]) 50 MCG/ACT nasal spray Place 1 spray into both nostrils daily. 09/25/21 09/25/22 Yes Rudolpho Sevin, NP  predniSONE (DELTASONE) 20 MG tablet Take 2 tablets (40 mg total) by mouth daily with breakfast for 7 days. 09/25/21 10/02/21 Yes Rudolpho Sevin, NP  acetaminophen (TYLENOL) 500 MG tablet Take 1,000 mg by mouth every 4 (four) hours as needed for moderate pain, mild pain or headache.    [provider]  albuterol (VENTOLIN HFA) 108 (90 Base) MCG/ACT inhaler Inhale 1-2 puffs into the  lungs every 6 (six) hours as needed for wheezing or shortness of breath. Use 2 puffs 3 times daily x5 days, then every 6 hours as needed. 08/12/21   Eugenie Filler, MD  amLODipine (NORVASC) 10 MG tablet Take 1 tablet (10 mg total) by mouth daily. 01/22/21   Little Ishikawa, MD  calcium carbonate (TUMS EX) 750 MG chewable tablet Chew 750-2,250 tablets (562,500-1,687,500 mg total) by mouth daily as needed for heartburn. 08/12/21   Eugenie Filler, MD  dicyclomine (BENTYL) 10 MG capsule Take 1 capsule (10 mg total) by mouth 3 (three) times daily as needed for spasms (Abdominal pain/Crampingh). Patient taking differently: Take 10 mg by mouth 3 (three) times daily as needed for spasms (Abdominal pain/Cramping). 08/01/21   Esterwood, Amy S, PA-C  iron polysaccharides (NIFEREX) 150 MG capsule Take 1 capsule (150 mg total) by mouth daily. 08/12/21   Eugenie Filler, MD  ondansetron (ZOFRAN) 4 MG tablet Take 1 tablet (4 mg total) by mouth every 8 (eight) hours as needed for nausea or vomiting. 08/01/21   Esterwood, Amy S, PA-C  pantoprazole (PROTONIX) 40 MG tablet Take 1 tablet (40 mg total) by mouth 2 (two) times daily. 09/03/21   Sharyn Creamer, MD  simethicone (MYLICON) 80 MG chewable tablet Chew 2 tablets (160 mg total) by mouth 4 (four) times daily as needed for flatulence. 08/12/21   Eugenie Filler, MD  sodium chloride (OCEAN) 0.65 % SOLN nasal spray Place 1 spray into both nostrils as needed for congestion. Patient taking differently: Place 1 spray into both nostrils 2 (two) times daily as needed for congestion. 04/01/21   Charlesetta Shanks, MD  labetalol (NORMODYNE) 100 MG tablet Take 1 tablet (100 mg total) by mouth 2 (two) times daily. 05/24/19 11/13/20  Jaynee Eagles, PA-C  NIFEdipine (PROCARDIA-XL/ADALAT CC) 60 MG 24 hr tablet Take 1 tablet (60 mg total) by mouth 2 (two) times daily. Patient not taking: Reported on 03/28/2019 12/13/17 05/24/19  Osborne Oman, MD  omeprazole (PRILOSEC) 40 MG  capsule Take 1 capsule (40 mg total) by mouth daily. 30 minutes before breakfast Patient not taking: Reported on 03/28/2019 05/12/18 05/24/19  Mauri Pole, MD    Family History Family History  Problem Relation Age of Onset   Hypertension Mother    Hypertension Father    COPD Maternal Grandfather    Liver disease Paternal Grandmother    Liver disease Paternal Grandfather     Social History Social History   Tobacco Use   Smoking status: Every Day    Packs/day: 0.50    Types: Cigarettes   Smokeless tobacco: Never  Vaping Use   Vaping Use: Never used  Substance Use Topics   Alcohol use: Yes    Comment: 4 per day   Drug use: Not Currently     Allergies  Other   Review of Systems As stated in HPI otherwise negative   Physical Exam Triage Vital Signs ED Triage Vitals  Enc Vitals Group     BP 09/25/21 1459 (!) 148/95     Pulse Rate 09/25/21 1459 91     Resp 09/25/21 1459 16     Temp 09/25/21 1459 98 F (36.7 C)     Temp Source 09/25/21 1459 Temporal     SpO2 09/25/21 1459 98 %     Weight --      Height --      Head Circumference --      Peak Flow --      Pain Score 09/25/21 1457 4     Pain Loc --      Pain Edu? --      Excl. in [place]? --    No data found.  Updated Vital Signs BP (!) 148/95    Pulse 91    Temp 98 F (36.7 C) (Temporal)    Resp 16    LMP 09/04/2021    SpO2 98%   Visual Acuity Right Eye Distance:   Left Eye Distance:   Bilateral Distance:    Right Eye Near:   Left Eye Near:    Bilateral Near:     Physical Exam Constitutional:      General: She is not in acute distress.    Appearance: Normal appearance. She is normal weight. She is not toxic-appearing.     Comments: Appears to feel unwell but in no acute distress  HENT:     Ears:     Comments: + Middle ear effusion bilaterally.  Right TM with purulent effusion covering upper left portion of TM.  No erythema or perforation bilaterally    Nose: Congestion and rhinorrhea present.      Comments: Copious amounts of thick, clear drainage    Mouth/Throat:     Mouth: Mucous membranes are moist.     Pharynx: Oropharynx is clear. No oropharyngeal exudate or posterior oropharyngeal erythema.  Eyes:     General: No scleral icterus.    Extraocular Movements: Extraocular movements intact.     Conjunctiva/sclera: Conjunctivae normal.  Cardiovascular:     Rate and Rhythm: Normal rate and regular rhythm.     Heart sounds: No murmur heard.   No friction rub. No gallop.  Pulmonary:     Effort: Pulmonary effort is normal. No respiratory distress.     Breath sounds: Normal breath sounds. No wheezing, rhonchi or rales.  Abdominal:     General: Bowel sounds are normal.     Palpations: Abdomen is soft.     Tenderness: There is no abdominal tenderness.  Musculoskeletal:     Cervical back: Normal range of motion and neck supple. No rigidity.  Lymphadenopathy:     Cervical: No cervical adenopathy.  Skin:    General: Skin is warm and dry.  Neurological:     General: No focal deficit present.     Mental Status: She is alert and oriented to person, place, and time.  Psychiatric:        Mood and Affect: Mood normal.        Behavior: Behavior normal.     UC Treatments / Results  Labs (all labs ordered are listed, but only abnormal results are displayed) Labs Reviewed  CERVICOVAGINAL ANCILLARY ONLY    EKG   Radiology No results found.  Procedures Procedures (including critical care time)  Medications Ordered in UC Medications -  No data to display  Initial Impression / Assessment and Plan / UC Course  I have reviewed the triage vital signs and the nursing notes.  Pertinent labs & imaging results that were available during my care of the patient were reviewed by me and considered in my medical decision making (see chart for details).  URI Suppurative OM, right ear Vaginal yeast infection -Patient with a long history of sinus issues followed by ENT.  Started  Dupixent today in preparation for upcoming sinus surgery.  We will treat empirically with p.o. doxycycline given recent course of amoxicillin -Given copious amounts of rhinorrhea, ordered prednisone burst -Will represcribe Flonase as patient has run out -If we can for yeast infection likely related to recent antibiotics -Rest, push fluids -Patient to follow-up with ENT as previously scheduled.  Return for any worsening or persistent symptoms prior to follow-up  Reviewed expections re: course of current medical issues. Questions answered. Outlined signs and symptoms indicating need for more acute intervention. Pt verbalized understanding. AVS given  Final Clinical Impressions(s) / UC Diagnoses   Final diagnoses:  URI, acute  Nasal polyposis  Non-recurrent acute suppurative otitis media of right ear without spontaneous rupture of tympanic membrane  Vaginal yeast infection     Discharge Instructions      I am treating you today for an acute sinusitis.  Please start antibiotics and take until completed.  Prednisone daily x7 days as we discussed.  There is no need to taper the steroid.  Just take 40 mg daily until gone.  I have also prescribed Flonase since you have run out.  You can also buy this over-the-counter.  Diflucan prescribed for yeast infection.  Rest, drink plenty of fluids.  Keep follow-up appoint with ENT.  Follow-up sooner for any worsening or persistent symptoms.     ED Prescriptions     Medication Sig Dispense Auth. Provider   doxycycline (VIBRA-TABS) 100 MG tablet Take 1 tablet (100 mg total) by mouth 2 (two) times daily for 10 days. 20 tablet Rudolpho Sevin, NP   predniSONE (DELTASONE) 20 MG tablet Take 2 tablets (40 mg total) by mouth daily with breakfast for 7 days. 14 tablet Rudolpho Sevin, NP   fluticasone (FLONASE) 50 MCG/ACT nasal spray Place 1 spray into both nostrils daily. 15.8 mL Rudolpho Sevin, NP   fluconazole (DIFLUCAN) 150 MG tablet Take 1 tablet (150  mg total) by mouth daily. Take x1.  Repeat in 3 days if symptoms persist 2 tablet Rudolpho Sevin, NP      PDMP not reviewed this encounter.   Rudolpho Sevin, NP 09/25/21 1616

## 2021-09-26 ENCOUNTER — Telehealth: Payer: Self-pay

## 2021-09-26 LAB — CERVICOVAGINAL ANCILLARY ONLY
Bacterial Vaginitis (gardnerella): POSITIVE — AB
Candida Glabrata: NEGATIVE
Candida Vaginitis: POSITIVE — AB
Chlamydia: NEGATIVE
Comment: NEGATIVE
Comment: NEGATIVE
Comment: NEGATIVE
Comment: NEGATIVE
Comment: NEGATIVE
Comment: NORMAL
Neisseria Gonorrhea: NEGATIVE
Trichomonas: NEGATIVE

## 2021-09-26 MED ORDER — METRONIDAZOLE 500 MG PO TABS: 500.0000 mg | ORAL_TABLET | Freq: Two times a day (BID) | ORAL | 0 refills | Status: DC

## 2021-10-07 ENCOUNTER — Ambulatory Visit: Payer: Medicaid Other | Admitting: Internal Medicine

## 2022-01-30 ENCOUNTER — Other Ambulatory Visit: Payer: Self-pay

## 2022-01-30 ENCOUNTER — Ambulatory Visit (HOSPITAL_COMMUNITY)
Admission: EM | Admit: 2022-01-30 | Discharge: 2022-01-30 | Disposition: A | Discharge status: Home / Self Care | Payer: Medicaid Other | Attending: Internal Medicine | Admitting: Internal Medicine

## 2022-01-30 ENCOUNTER — Encounter (HOSPITAL_COMMUNITY): Payer: Self-pay | Admitting: Emergency Medicine

## 2022-01-30 DIAGNOSIS — B9689 Other specified bacterial agents as the cause of diseases classified elsewhere: Secondary | ICD-10-CM | POA: Insufficient documentation

## 2022-01-30 DIAGNOSIS — N898 Other specified noninflammatory disorders of vagina: Secondary | ICD-10-CM | POA: Diagnosis present

## 2022-01-30 DIAGNOSIS — J019 Acute sinusitis, unspecified: Secondary | ICD-10-CM | POA: Insufficient documentation

## 2022-01-30 DIAGNOSIS — I10 Essential (primary) hypertension: Secondary | ICD-10-CM | POA: Diagnosis present

## 2022-01-30 LAB — POCT URINALYSIS DIPSTICK, ED / UC
Bilirubin Urine: NEGATIVE
Glucose, UA: NEGATIVE mg/dL
Hgb urine dipstick: NEGATIVE
Ketones, ur: NEGATIVE mg/dL
Leukocytes,Ua: NEGATIVE
Nitrite: NEGATIVE
Protein, ur: NEGATIVE mg/dL
Specific Gravity, Urine: 1.025 (ref 1.005–1.030)
Urobilinogen, UA: 0.2 mg/dL (ref 0.0–1.0)
pH: 6.5 (ref 5.0–8.0)

## 2022-01-30 LAB — POC URINE PREG, ED: Preg Test, Ur: NEGATIVE

## 2022-01-30 MED ORDER — METRONIDAZOLE 500 MG PO TABS: 500.0000 mg | ORAL_TABLET | Freq: Two times a day (BID) | ORAL | 0 refills | Status: AC

## 2022-01-30 MED ORDER — FLUTICASONE PROPIONATE 50 MCG/ACT NA SUSP
1.0000 | Freq: Every day | NASAL | 0 refills | Status: DC
Start: 2022-01-30 — End: 2022-05-12

## 2022-01-30 MED ORDER — AMOXICILLIN 500 MG PO TABS: 500.0000 mg | ORAL_TABLET | Freq: Two times a day (BID) | ORAL | 14 refills | Status: DC

## 2022-01-30 MED ORDER — FLUCONAZOLE 100 MG PO TABS: 100.0000 mg | ORAL_TABLET | Freq: Every day | ORAL | 0 refills | Status: AC

## 2022-01-30 NOTE — Discharge Instructions (Addendum)
We have gone over several things today.  First I am treating you for an acute sinusitis given your history of nasal polyps.  Take antibiotic as prescribed.  I would also like you to restart Flonase and cetirizine which can be bought over-the-counter.  I am prescribing Diflucan should you develop a yeast infection from the antibiotics.  Go ahead and take the Dupixent as prescribed by ENT and follow-up with them.  I am also prescribing medication to treat suspected bacterial vaginosis.  Further testing has been sent and we will notify you should you need any further treatment.  Please begin taking your blood pressure medication that was previously prescribed.  You may benefit from buying a home blood pressure cuff and checking at home.  Please follow-up for any persistent or worsening symptoms.  Otherwise, schedule follow-up with primary care provider in 1 to 2 weeks.

## 2022-01-30 NOTE — ED Provider Notes (Signed)
[facility]    CSN: [id number] Arrival date & time: 01/30/22  1513      History   Chief Complaint Chief Complaint  Patient presents with   URI    HPI Susan Walton is a 41 y.o. female with multiple medical problems including past medical history of nasal polyps and chronic rhinosinusitis.  Patient stopped Dupixent in 2/23 and did not follow-up with recommended nasal polyp surgery.  She reports 4-day history of nasal congestion, itchy, watery eyes, chest congestion.  Patient reports bloody nasal drainage.  She denies any cough sore throat, fever, chills, shortness of breath.  Patient has taken Claritin in the past but stopped as she felt like it was no longer helping.  Patient also describing lower abdominal cramping x2 days with burning upon urination and white vaginal discharge with foul odor.  Patient reports symptoms similar to previous BV infections.  She denies any recent N/V/D, irregular vaginal bleeding.  Patient also admits to not being compliant with blood pressure medication.  States she has supply of prescribed medication at home and will begin taking.   Past Medical History:  Diagnosis Date   Alcoholism ([place])    no alcohol x 1 month per patient 06/2021   Anemia    Anxiety    Asthma attack 01/19/2021   Blind left eye    Depression    Fibroid    Helicobacter pylori gastritis    Hypertension    Retinal detachment     Patient Active Problem List   Diagnosis Date Noted   Bronchiectasis ([place]) 08/12/2021   Aspiration pneumonia ([place]) 08/12/2021   Dyspnea 08/11/2021   Nausea and vomiting 08/11/2021   Acute respiratory failure with hypoxemia ([place]) 08/11/2021   Depression 06/27/2021   MDD (major depressive disorder), recurrent severe, 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   Schizoaffective disorder ([place]) 06/10/2021   PTSD (post-traumatic stress disorder) 06/10/2021   GAD (generalized anxiety disorder) 06/10/2021   Acute respiratory failure with hypoxia ([place])  01/20/2021   Asthma attack 01/19/2021   Asthma 01/19/2021   Group B Streptococcus carrier, +RV culture, currently pregnant 12/10/2017   Gonorrhea affecting pregnancy in first trimester 12/10/2017   Depression affecting pregnancy in third trimester, antepartum 10/20/2017   Pregnancy, supervision, high-risk 07/27/2017   Chronic hypertension with superimposed severe preeclampsia 07/27/2017   Drug abuse during pregnancy ([place]) 07/27/2017   Thyroid dysfunction, antepartum 07/27/2017   History of trichomoniasis 07/27/2017   Unwanted fertility 07/27/2017   Advanced maternal age in multigravida, second trimester    Adjustment disorder with depressed mood 06/05/2017    Past Surgical History:  Procedure Laterality Date   DILATION AND CURETTAGE OF UTERUS     EYE SURGERY Left    TUBAL LIGATION N/A 12/11/2017   Procedure: POST PARTUM TUBAL LIGATION;  Surgeon: [name], MD;  Location: [facility];  Service: Gynecology;  Laterality: N/A;    OB History     Gravida  8   Para  5   Term  5   Preterm  0   AB  3   Living  5      SAB  2   IAB  1   Ectopic  0   Multiple  0   Live Births  5            Home Medications    Prior to Admission medications   Medication Sig Start Date End Date Taking? Authorizing Provider  amoxicillin (AMOXIL) 500 MG tablet Take 1 tablet (500 mg  total) by mouth 2 (two) times daily. 01/30/22  Yes Rudolpho Sevin, NP  fluticasone (FLONASE) 50 MCG/ACT nasal spray Place 1 spray into both nostrils daily. 01/30/22 01/30/23 Yes Rudolpho Sevin, NP  metroNIDAZOLE (FLAGYL) 500 MG tablet Take 1 tablet (500 mg total) by mouth 2 (two) times daily for 7 days. 01/30/22 02/06/22 Yes Rudolpho Sevin, NP  acetaminophen (TYLENOL) 500 MG tablet Take 1,000 mg by mouth every 4 (four) hours as needed for moderate pain, mild pain or headache.    [provider]  albuterol (VENTOLIN HFA) 108 (90 Base) MCG/ACT inhaler Inhale 1-2 puffs into the lungs every 6  (six) hours as needed for wheezing or shortness of breath. Use 2 puffs 3 times daily x5 days, then every 6 hours as needed. Patient not taking: Reported on 01/30/2022 08/12/21   Eugenie Filler, MD  amLODipine (NORVASC) 10 MG tablet Take 1 tablet (10 mg total) by mouth daily. Patient not taking: Reported on 01/30/2022 01/22/21   Little Ishikawa, MD  calcium carbonate (TUMS EX) 750 MG chewable tablet Chew 750-2,250 tablets (562,500-1,687,500 mg total) by mouth daily as needed for heartburn. 08/12/21   Eugenie Filler, MD  dicyclomine (BENTYL) 10 MG capsule Take 1 capsule (10 mg total) by mouth 3 (three) times daily as needed for spasms (Abdominal pain/Crampingh). Patient taking differently: Take 10 mg by mouth 3 (three) times daily as needed for spasms (Abdominal pain/Cramping). 08/01/21   Esterwood, Amy S, PA-C  iron polysaccharides (NIFEREX) 150 MG capsule Take 1 capsule (150 mg total) by mouth daily. 08/12/21   Eugenie Filler, MD  ondansetron (ZOFRAN) 4 MG tablet Take 1 tablet (4 mg total) by mouth every 8 (eight) hours as needed for nausea or vomiting. Patient not taking: Reported on 01/30/2022 08/01/21   Esterwood, Amy S, PA-C  pantoprazole (PROTONIX) 40 MG tablet Take 1 tablet (40 mg total) by mouth 2 (two) times daily. Patient not taking: Reported on 01/30/2022 09/03/21   Sharyn Creamer, MD  simethicone [facility]) 80 MG chewable tablet Chew 2 tablets (160 mg total) by mouth 4 (four) times daily as needed for flatulence. 08/12/21   Eugenie Filler, MD  sodium chloride (OCEAN) 0.65 % SOLN nasal spray Place 1 spray into both nostrils as needed for congestion. Patient taking differently: Place 1 spray into both nostrils 2 (two) times daily as needed for congestion. 04/01/21   Charlesetta Shanks, MD  labetalol (NORMODYNE) 100 MG tablet Take 1 tablet (100 mg total) by mouth 2 (two) times daily. 05/24/19 11/13/20  Jaynee Eagles, PA-C  NIFEdipine (PROCARDIA-XL/ADALAT CC) 60 MG 24 hr tablet Take 1  tablet (60 mg total) by mouth 2 (two) times daily. Patient not taking: Reported on 03/28/2019 12/13/17 05/24/19  Osborne Oman, MD  omeprazole (PRILOSEC) 40 MG capsule Take 1 capsule (40 mg total) by mouth daily. 30 minutes before breakfast Patient not taking: Reported on 03/28/2019 05/12/18 05/24/19  Mauri Pole, MD    Family History Family History  Problem Relation Age of Onset   Hypertension Mother    Hypertension Father    COPD Maternal Grandfather    Liver disease Paternal Grandmother    Liver disease Paternal Grandfather     Social History Social History   Tobacco Use   Smoking status: Every Day    Packs/day: 0.50    Types: Cigarettes   Smokeless tobacco: Never  Vaping Use   Vaping Use: Never used  Substance Use Topics   Alcohol use:  Yes    Comment: 4 per day   Drug use: Not Currently     Allergies   Other   Review of Systems As stated in HPI otherwise negative   Physical Exam Triage Vital Signs ED Triage Vitals  Enc Vitals Group     BP 01/30/22 1647 (!) 192/110     Pulse Rate 01/30/22 1647 79     Resp 01/30/22 1647 18     Temp 01/30/22 1647 98.3 F (36.8 C)     Temp Source 01/30/22 1647 Oral     SpO2 01/30/22 1647 100 %     Weight 01/30/22 1642 173 lb (78.5 kg)     Height --      Head Circumference --      Peak Flow --      Pain Score 01/30/22 1644 4     Pain Loc --      Pain Edu? --      Excl. in [place]? --    No data found.  Updated Vital Signs BP (!) 192/110 (BP Location: Left Arm)   Pulse 79   Temp 98.3 F (36.8 C) (Oral)   Resp 18   Wt 78.5 kg   LMP 01/01/2022   SpO2 100%   BMI 27.10 kg/m   Visual Acuity Right Eye Distance:   Left Eye Distance:   Bilateral Distance:    Right Eye Near:   Left Eye Near:    Bilateral Near:     Physical Exam Constitutional:      General: She is not in acute distress.    Appearance: Normal appearance. She is not ill-appearing or toxic-appearing.  HENT:     Nose: Congestion and rhinorrhea  present.     Comments: Swollen, boggy nasal turbinates    Mouth/Throat:     Mouth: Mucous membranes are moist.     Pharynx: No oropharyngeal exudate or posterior oropharyngeal erythema.  Eyes:     General: No scleral icterus.       Right eye: No discharge.        Left eye: No discharge.     Extraocular Movements: Extraocular movements intact.     Conjunctiva/sclera: Conjunctivae normal.  Cardiovascular:     Rate and Rhythm: Normal rate and regular rhythm.  Pulmonary:     Effort: Pulmonary effort is normal.     Breath sounds: Normal breath sounds. No wheezing, rhonchi or rales.  Abdominal:     General: Bowel sounds are normal.     Palpations: Abdomen is soft.     Tenderness: There is no abdominal tenderness. There is no right CVA tenderness, left CVA tenderness, guarding or rebound.  Musculoskeletal:        General: Normal range of motion.     Cervical back: Normal range of motion and neck supple. No tenderness.  Lymphadenopathy:     Cervical: No cervical adenopathy.  Skin:    General: Skin is warm and dry.  Neurological:     General: No focal deficit present.     Mental Status: She is alert and oriented to person, place, and time.  Psychiatric:        Mood and Affect: Mood normal.        Behavior: Behavior normal.        Thought Content: Thought content normal.     UC Treatments / Results  Labs (all labs ordered are listed, but only abnormal results are displayed) Labs Reviewed  CERVICOVAGINAL ANCILLARY ONLY - Abnormal; Notable for  the following components:      Result Value   Bacterial Vaginitis (gardnerella) Positive (*)    Candida Vaginitis Positive (*)    All other components within normal limits  POCT URINALYSIS DIPSTICK, ED / UC  POC URINE PREG, ED    EKG   Radiology No results found.  Procedures Procedures (including critical care time)  Medications Ordered in UC Medications - No data to display  Initial Impression / Assessment and Plan / UC Course   I have reviewed the triage vital signs and the nursing notes.  Pertinent labs & imaging results that were available during my care of the patient were reviewed by me and considered in my medical decision making (see chart for details).  Acute on chronic sinusitis -Patient with long history of sinusitis followed by ENT in the past.  Symptoms had improved with Dupixent injections.  Patient states she stopped taking in 2/22 and decided not to follow-up for nasal polyp surgery -Suspect current symptoms will transition quickly to bacterial infection.  We will go ahead and treat as such with Amox twice daily x7 days -Restart Flonase and cetirizine  Vaginal discharge -STI testing sent.  We will treat empirically for BV  Uncontrolled hypertension -Patient admits to noncompliance with current medications.  Long discussion regarding importance of blood pressure control.  Patient denies any need for refill medications stating she has plenty at home -She denies any recent headache, dizziness, chest pain or shortness of breath Final Clinical Impressions(s) / UC Diagnoses   Final diagnoses:  Uncontrolled hypertension  Acute bacterial rhinosinusitis  Vaginal discharge     Discharge Instructions      We have gone over several things today.  First I am treating you for an acute sinusitis given your history of nasal polyps.  Take antibiotic as prescribed.  I would also like you to restart Flonase and cetirizine which can be bought over-the-counter.  I am prescribing Diflucan should you develop a yeast infection from the antibiotics.  Go ahead and take the Dupixent as prescribed by ENT and follow-up with them.  I am also prescribing medication to treat suspected bacterial vaginosis.  Further testing has been sent and we will notify you should you need any further treatment.  Please begin taking your blood pressure medication that was previously prescribed.  You may benefit from buying a home blood  pressure cuff and checking at home.  Please follow-up for any persistent or worsening symptoms.  Otherwise, schedule follow-up with primary care provider in 1 to 2 weeks.     ED Prescriptions     Medication Sig Dispense Auth. Provider   amoxicillin (AMOXIL) 500 MG tablet Take 1 tablet (500 mg total) by mouth 2 (two) times daily. 7 tablet Rudolpho Sevin, NP   fluconazole (DIFLUCAN) 100 MG tablet Take 1 tablet (100 mg total) by mouth daily for 1 day. 1 tablet Rudolpho Sevin, NP   fluticasone [facility]) 50 MCG/ACT nasal spray Place 1 spray into both nostrils daily. 11.1 mL Rudolpho Sevin, NP   metroNIDAZOLE (FLAGYL) 500 MG tablet Take 1 tablet (500 mg total) by mouth 2 (two) times daily for 7 days. 14 tablet Rudolpho Sevin, NP      PDMP not reviewed this encounter.   Rudolpho Sevin, NP 02/01/22 [phone]

## 2022-01-30 NOTE — ED Triage Notes (Signed)
Patient reports 4 day history of runny nose, sinus drainage, itchy eyes, sneezing and congestion.    Patient has pain in left abdomen, vaginal discharge and odor, burning with urination

## 2022-01-31 LAB — CERVICOVAGINAL ANCILLARY ONLY
Bacterial Vaginitis (gardnerella): POSITIVE — AB
Candida Glabrata: NEGATIVE
Candida Vaginitis: POSITIVE — AB
Chlamydia: NEGATIVE
Comment: NEGATIVE
Comment: NEGATIVE
Comment: NEGATIVE
Comment: NEGATIVE
Comment: NEGATIVE
Comment: NORMAL
Neisseria Gonorrhea: NEGATIVE
Trichomonas: NEGATIVE

## 2022-03-20 ENCOUNTER — Emergency Department (HOSPITAL_COMMUNITY): Payer: Medicaid Other

## 2022-03-20 ENCOUNTER — Encounter (HOSPITAL_COMMUNITY): Payer: Self-pay

## 2022-03-20 ENCOUNTER — Other Ambulatory Visit: Payer: Self-pay

## 2022-03-20 ENCOUNTER — Emergency Department (HOSPITAL_COMMUNITY)
Admission: EM | Admit: 2022-03-20 | Discharge: 2022-03-21 | Disposition: A | Discharge status: Home / Self Care | Payer: Medicaid Other | Attending: Emergency Medicine | Admitting: Emergency Medicine

## 2022-03-20 DIAGNOSIS — F10129 Alcohol abuse with intoxication, unspecified: Secondary | ICD-10-CM | POA: Insufficient documentation

## 2022-03-20 DIAGNOSIS — W01198A Fall on same level from slipping, tripping and stumbling with subsequent striking against other object, initial encounter: Secondary | ICD-10-CM | POA: Diagnosis not present

## 2022-03-20 DIAGNOSIS — Z79899 Other long term (current) drug therapy: Secondary | ICD-10-CM | POA: Insufficient documentation

## 2022-03-20 DIAGNOSIS — F10920 Alcohol use, unspecified with intoxication, uncomplicated: Secondary | ICD-10-CM

## 2022-03-20 DIAGNOSIS — S0181XA Laceration without foreign body of other part of head, initial encounter: Secondary | ICD-10-CM | POA: Diagnosis not present

## 2022-03-20 DIAGNOSIS — W19XXXA Unspecified fall, initial encounter: Secondary | ICD-10-CM

## 2022-03-20 DIAGNOSIS — S0990XA Unspecified injury of head, initial encounter: Secondary | ICD-10-CM | POA: Diagnosis present

## 2022-03-20 LAB — CBC WITH DIFFERENTIAL/PLATELET
Abs Immature Granulocytes: 0.02 K/uL (ref 0.00–0.07)
Basophils Absolute: 0.1 K/uL (ref 0.0–0.1)
Basophils Relative: 1 %
Eosinophils Absolute: 0.2 K/uL (ref 0.0–0.5)
Eosinophils Relative: 3 %
HCT: 30.8 % — ABNORMAL LOW (ref 36.0–46.0)
Hemoglobin: 8.8 g/dL — ABNORMAL LOW (ref 12.0–15.0)
Immature Granulocytes: 0 %
Lymphocytes Relative: 23 %
Lymphs Abs: 1.5 K/uL (ref 0.7–4.0)
MCH: 19.3 pg — ABNORMAL LOW (ref 26.0–34.0)
MCHC: 28.6 g/dL — ABNORMAL LOW (ref 30.0–36.0)
MCV: 67.4 fL — ABNORMAL LOW (ref 80.0–100.0)
Monocytes Absolute: 0.3 K/uL (ref 0.1–1.0)
Monocytes Relative: 5 %
Neutro Abs: 4.4 K/uL (ref 1.7–7.7)
Neutrophils Relative %: 68 %
Platelets: 330 K/uL (ref 150–400)
RBC: 4.57 MIL/uL (ref 3.87–5.11)
RDW: 20.4 % — ABNORMAL HIGH (ref 11.5–15.5)
WBC: 6.5 K/uL (ref 4.0–10.5)
nRBC: 0 % (ref 0.0–0.2)

## 2022-03-20 LAB — COMPREHENSIVE METABOLIC PANEL
ALT: 11 U/L (ref 0–44)
AST: 18 U/L (ref 15–41)
Albumin: 4.1 g/dL (ref 3.5–5.0)
Alkaline Phosphatase: 46 U/L (ref 38–126)
Anion gap: 10 (ref 5–15)
BUN: 6 mg/dL (ref 6–20)
CO2: 22 mmol/L (ref 22–32)
Calcium: 8.8 mg/dL — ABNORMAL LOW (ref 8.9–10.3)
Chloride: 109 mmol/L (ref 98–111)
Creatinine, Ser: 0.71 mg/dL (ref 0.44–1.00)
GFR, Estimated: >60 mL/min (ref >60)
Glucose, Bld: 95 mg/dL (ref 70–99)
Potassium: 3.4 mmol/L — ABNORMAL LOW (ref 3.5–5.1)
Sodium: 141 mmol/L (ref 135–145)
Total Bilirubin: 0.2 mg/dL — ABNORMAL LOW (ref 0.3–1.2)
Total Protein: 8 g/dL (ref 6.5–8.1)

## 2022-03-20 LAB — I-STAT BETA HCG BLOOD, ED (MC, WL, AP ONLY): I-stat hCG, quantitative: <5 m[IU]/mL (ref <5)

## 2022-03-20 LAB — ETHANOL: Alcohol, Ethyl (B): 376 mg/dL (ref <10)

## 2022-03-20 MED ORDER — LORAZEPAM 2 MG/ML IJ SOLN
1.0000 mg | Freq: Once | INTRAMUSCULAR | Status: AC
  Administered 2022-03-20: 1 mg via INTRAMUSCULAR

## 2022-03-20 MED ORDER — LORAZEPAM 2 MG/ML IJ SOLN
1.0000 mg | Freq: Once | INTRAMUSCULAR | Status: DC
  Filled 2022-03-20: qty 1
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
## 2022-03-20 NOTE — ED Notes (Signed)
Critical value 376 for alcohol

## 2022-03-20 NOTE — ED Notes (Signed)
Family updated as to patient's status.
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
## 2022-03-20 NOTE — ED Notes (Signed)
Pts daughter called and wanted [place] to give her a message. I advised her she was still asleep. I advised them if they would like to come see her they could and they could check on her. Family also advised when she wakes up the plan is for discharge

## 2022-03-20 NOTE — ED Notes (Signed)
Pt o2 drops to 78 when she is sleeping I placed the pt back on her [place] at 2LPM and increases to 98% when sleeping

## 2022-03-20 NOTE — ED Provider Notes (Signed)
Handoff received on this 41 year old female who presented following a fall and then was intoxicated.  CT imaging is without acute concerns.  Currently she is awaiting to metabolize and be able to ambulate safely to be discharged to her family.  Physical Exam  BP (!) 159/115   Pulse 97   Temp (!) 97.5 F (36.4 C) (Oral)   Resp 16   Ht '5\' 7"'$  (1.702 m)   Wt 78.5 kg   SpO2 99%   BMI 27.11 kg/m     Procedures  Procedures  ED Course / MDM    Medical Decision Making Amount and/or Complexity of Data Reviewed Labs: ordered. Radiology: ordered.  Risk Prescription drug management.   Patient awake and alert.  Ambulating with a steady gait.  Patient is appropriate for discharge.  Discharged in stable condition.  Family notified.       Evlyn Courier, PA-C 03/21/22 [id number]    Lucrezia Starch, MD 03/22/22 [phone]

## 2022-03-20 NOTE — ED Notes (Signed)
Daughter called wanted [place] to take down her phone number in case we need it or her mom wants to call her.  [phone]

## 2022-03-21 NOTE — Discharge Instructions (Addendum)
Follow-up with your primary care provider.  Your scans did not show any concerning findings.

## 2022-05-12 ENCOUNTER — Ambulatory Visit (HOSPITAL_COMMUNITY)
Admission: EM | Admit: 2022-05-12 | Discharge: 2022-05-12 | Disposition: A | Discharge status: Home / Self Care | Payer: Medicaid Other | Attending: Internal Medicine | Admitting: Internal Medicine

## 2022-05-12 ENCOUNTER — Encounter (HOSPITAL_COMMUNITY): Payer: Self-pay

## 2022-05-12 DIAGNOSIS — J339 Nasal polyp, unspecified: Secondary | ICD-10-CM | POA: Diagnosis not present

## 2022-05-12 DIAGNOSIS — I10 Essential (primary) hypertension: Secondary | ICD-10-CM | POA: Diagnosis not present

## 2022-05-12 DIAGNOSIS — J309 Allergic rhinitis, unspecified: Secondary | ICD-10-CM | POA: Diagnosis not present

## 2022-05-12 MED ORDER — OLOPATADINE HCL 0.1 % OP SOLN: 1.0000 [drp] | Freq: Two times a day (BID) | OPHTHALMIC | 12 refills | Status: DC

## 2022-05-12 MED ORDER — FLUTICASONE PROPIONATE 50 MCG/ACT NA SUSP: 1.0000 | Freq: Every day | NASAL | 2 refills | Status: DC

## 2022-05-12 MED ORDER — AMLODIPINE BESYLATE 5 MG PO TABS: 5.0000 mg | ORAL_TABLET | Freq: Every day | ORAL | 1 refills | Status: DC

## 2022-05-12 MED ORDER — CETIRIZINE HCL 10 MG PO TABS: 10.0000 mg | ORAL_TABLET | Freq: Every day | ORAL | 2 refills | Status: DC

## 2022-05-12 MED ORDER — BLOOD PRESSURE CUFF MISC: 0 refills | Status: AC

## 2022-05-12 NOTE — ED Provider Notes (Signed)
[place]    CSN: [id number] Arrival date & time: 05/12/22  1656      History   Chief Complaint Chief Complaint  Patient presents with   Nasal Polyps    HPI Susan Walton is a 41 y.o. female.   Patient presents to urgent care for evaluation of nasal congestion, cough, sneezing, and watery itchy eyes for the last 4 to 5 days.  She has a history of nasal polyps for which she received injections of Dupixent in the past but she has not been to her provider to receive these injections in many months.  States she has a history of allergic rhinitis but does not currently take any medications for this.  Cough is dry and nagging.  Rhinorrhea is clear.  Denies eye redness, blurry vision, decreased visual acuity, headache, fever/chills, nausea, vomiting, abdominal pain, urinary symptoms, dizziness, and ear pain.  No known sick contacts.  Patient has a history of asthma and uses albuterol inhaler intermittently as needed for cough and shortness of breath.  Blood pressure is notably elevated in clinic at 183/116.  Patient states that her primary care provider put her on Procardia last November and it made her feel "weird" so she stopped taking it.  She has not been back to her primary care provider since then and does not currently take any medications for elevated blood pressure.  Denies one-sided weakness, chest pain, shortness of breath, or lightheadedness.  She has not attempted use of any over-the-counter medications prior to arrival for her symptoms.     Past Medical History:  Diagnosis Date   Alcoholism ([place])    no alcohol x 1 month per patient 06/2021   Anemia    Anxiety    Asthma attack 01/19/2021   Blind left eye    Depression    Fibroid    Helicobacter pylori gastritis    Hypertension    Retinal detachment     Patient Active Problem List   Diagnosis Date Noted   Essential hypertension 05/12/2022   Bronchiectasis ([place]) 08/12/2021   Aspiration pneumonia ([place])  08/12/2021   Dyspnea 08/11/2021   Nausea and vomiting 08/11/2021   Acute respiratory failure with hypoxemia ([place]) 08/11/2021   Depression 06/27/2021   MDD (major depressive disorder), recurrent severe, 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   Schizoaffective disorder ([place]) 06/10/2021   PTSD (post-traumatic stress disorder) 06/10/2021   GAD (generalized anxiety disorder) 06/10/2021   Acute respiratory failure with hypoxia ([place]) 01/20/2021   Asthma attack 01/19/2021   Asthma 01/19/2021   Group B Streptococcus carrier, +RV culture, currently pregnant 12/10/2017   Gonorrhea affecting pregnancy in first trimester 12/10/2017   Depression affecting pregnancy in third trimester, antepartum 10/20/2017   Pregnancy, supervision, high-risk 07/27/2017   Chronic hypertension with superimposed severe preeclampsia 07/27/2017   Drug abuse during pregnancy ([place]) 07/27/2017   Thyroid dysfunction, antepartum 07/27/2017   History of trichomoniasis 07/27/2017   Unwanted fertility 07/27/2017   Advanced maternal age in multigravida, second trimester    Adjustment disorder with depressed mood 06/05/2017    Past Surgical History:  Procedure Laterality Date   DILATION AND CURETTAGE OF UTERUS     EYE SURGERY Left    TUBAL LIGATION N/A 12/11/2017   Procedure: POST PARTUM TUBAL LIGATION;  Surgeon: [name], MD;  Location: [place];  Service: Gynecology;  Laterality: N/A;    OB History     Gravida  8   Para  5   Term  5   Preterm  0   AB  3   Living  5      SAB  2   IAB  1   Ectopic  0   Multiple  0   Live Births  5            Home Medications    Prior to Admission medications   Medication Sig Start Date End Date Taking? Authorizing Provider  amLODipine (NORVASC) 5 MG tablet Take 1 tablet (5 mg total) by mouth daily. 05/12/22  Yes Talbot Grumbling, FNP  Blood Pressure Monitoring (BLOOD PRESSURE CUFF) MISC Take your blood pressure 3-4 times weekly and write them  down. 05/12/22  Yes Talbot Grumbling, FNP  cetirizine (ZYRTEC) 10 MG tablet Take 1 tablet (10 mg total) by mouth daily. 05/12/22  Yes Talbot Grumbling, FNP  fluticasone (FLONASE) 50 MCG/ACT nasal spray Place 1 spray into both nostrils daily. 05/12/22  Yes Talbot Grumbling, FNP  olopatadine (PATANOL) 0.1 % ophthalmic solution Place 1 drop into both eyes 2 (two) times daily. 05/12/22  Yes Talbot Grumbling, FNP  acetaminophen (TYLENOL) 500 MG tablet Take 1,000 mg by mouth every 4 (four) hours as needed for moderate pain, mild pain or headache.    [provider]  albuterol (VENTOLIN HFA) 108 (90 Base) MCG/ACT inhaler Inhale 1-2 puffs into the lungs every 6 (six) hours as needed for wheezing or shortness of breath. Use 2 puffs 3 times daily x5 days, then every 6 hours as needed. Patient not taking: Reported on 01/30/2022 08/12/21   Eugenie Filler, MD  amoxicillin (AMOXIL) 500 MG tablet Take 1 tablet (500 mg total) by mouth 2 (two) times daily. 01/30/22   Rudolpho Sevin, NP  calcium carbonate (TUMS EX) 750 MG chewable tablet Chew 750-2,250 tablets (562,500-1,687,500 mg total) by mouth daily as needed for heartburn. 08/12/21   Eugenie Filler, MD  dicyclomine (BENTYL) 10 MG capsule Take 1 capsule (10 mg total) by mouth 3 (three) times daily as needed for spasms (Abdominal pain/Crampingh). Patient taking differently: Take 10 mg by mouth 3 (three) times daily as needed for spasms (Abdominal pain/Cramping). 08/01/21   Esterwood, Amy S, PA-C  iron polysaccharides (NIFEREX) 150 MG capsule Take 1 capsule (150 mg total) by mouth daily. 08/12/21   Eugenie Filler, MD  ondansetron (ZOFRAN) 4 MG tablet Take 1 tablet (4 mg total) by mouth every 8 (eight) hours as needed for nausea or vomiting. Patient not taking: Reported on 01/30/2022 08/01/21   Esterwood, Amy S, PA-C  pantoprazole (PROTONIX) 40 MG tablet Take 1 tablet (40 mg total) by mouth 2 (two) times daily. Patient not taking:  Reported on 01/30/2022 09/03/21   Sharyn Creamer, MD  simethicone [employer]) 80 MG chewable tablet Chew 2 tablets (160 mg total) by mouth 4 (four) times daily as needed for flatulence. 08/12/21   Eugenie Filler, MD  sodium chloride (OCEAN) 0.65 % SOLN nasal spray Place 1 spray into both nostrils as needed for congestion. Patient taking differently: Place 1 spray into both nostrils 2 (two) times daily as needed for congestion. 04/01/21   Charlesetta Shanks, MD  labetalol (NORMODYNE) 100 MG tablet Take 1 tablet (100 mg total) by mouth 2 (two) times daily. 05/24/19 11/13/20  Jaynee Eagles, PA-C  NIFEdipine (PROCARDIA-XL/ADALAT CC) 60 MG 24 hr tablet Take 1 tablet (60 mg total) by mouth 2 (two) times daily. Patient not taking: Reported on 03/28/2019 12/13/17 05/24/19  Anyanwu, Sallyanne Havers, MD  omeprazole (PRILOSEC) 40  MG capsule Take 1 capsule (40 mg total) by mouth daily. 30 minutes before breakfast Patient not taking: Reported on 03/28/2019 05/12/18 05/24/19  Mauri Pole, MD    Family History Family History  Problem Relation Age of Onset   Hypertension Mother    Hypertension Father    COPD Maternal Grandfather    Liver disease Paternal Grandmother    Liver disease Paternal Grandfather     Social History Social History   Tobacco Use   Smoking status: Every Day    Packs/day: 0.50    Types: Cigarettes   Smokeless tobacco: Never  Vaping Use   Vaping Use: Never used  Substance Use Topics   Alcohol use: Yes    Comment: 4 per day   Drug use: Not Currently     Allergies   Other   Review of Systems Review of Systems Per HPI  Physical Exam Triage Vital Signs ED Triage Vitals  Enc Vitals Group     BP 05/12/22 1744 (!) 183/116     Pulse Rate 05/12/22 1744 75     Resp 05/12/22 1744 19     Temp 05/12/22 1744 97.9 F (36.6 C)     Temp src --      SpO2 05/12/22 1744 99 %     Weight --      Height --      Head Circumference --      Peak Flow --      Pain Score 05/12/22 1743 5     Pain  Loc --      Pain Edu? --      Excl. in [place]? --    No data found.  Updated Vital Signs BP (!) 183/116   Pulse 75   Temp 97.9 F (36.6 C)   Resp 19   LMP 05/09/2022   SpO2 99%   Visual Acuity Right Eye Distance:   Left Eye Distance:   Bilateral Distance:    Right Eye Near:   Left Eye Near:    Bilateral Near:     Physical Exam Vitals and nursing note reviewed.  Constitutional:      Appearance: Normal appearance. She is not ill-appearing or toxic-appearing.     Comments: Very pleasant patient sitting on exam in position of comfort table in no acute distress.   HENT:     Head: Normocephalic and atraumatic.     Right Ear: Hearing, tympanic membrane, ear canal and external ear normal.     Left Ear: Hearing, tympanic membrane, ear canal and external ear normal.     Nose: Nasal tenderness and rhinorrhea present. Rhinorrhea is clear.     Right Turbinates: Swollen.     Left Turbinates: Swollen.     Mouth/Throat:     Lips: Pink.     Mouth: Mucous membranes are moist.     Pharynx: Posterior oropharyngeal erythema present.     Comments: Mild erythema to posterior oropharynx with small amount of clear postnasal drainage visualized. Airway intact and patent. Eyes:     General: Lids are normal. Vision grossly intact. Gaze aligned appropriately.     Extraocular Movements: Extraocular movements intact.     Conjunctiva/sclera: Conjunctivae normal.     Pupils: Pupils are equal, round, and reactive to light.  Cardiovascular:     Rate and Rhythm: Normal rate and regular rhythm.     Heart sounds: Normal heart sounds, S1 normal and S2 normal.  Pulmonary:     Effort: Pulmonary effort is normal.  No respiratory distress.     Breath sounds: Normal breath sounds and air entry.     Comments: Clear to auscultation bilaterally. Abdominal:     Palpations: Abdomen is soft.  Musculoskeletal:     Cervical back: Neck supple.  Lymphadenopathy:     Cervical: No cervical adenopathy.  Skin:     General: Skin is warm and dry.     Capillary Refill: Capillary refill takes less than 2 seconds.     Findings: No rash.  Neurological:     General: No focal deficit present.     Mental Status: She is alert and oriented to person, place, and time. Mental status is at baseline.     Cranial Nerves: No dysarthria or facial asymmetry.     Sensory: Sensation is intact.     Motor: Motor function is intact.     Coordination: Coordination is intact.     Gait: Gait is intact.     Comments: Nonfocal neuro exam.  5/5 strength throughout.  Psychiatric:        Mood and Affect: Mood normal.        Speech: Speech normal.        Behavior: Behavior normal.        Thought Content: Thought content normal.        Judgment: Judgment normal.      UC Treatments / Results  Labs (all labs ordered are listed, but only abnormal results are displayed) Labs Reviewed - No data to display  EKG   Radiology No results found.  Procedures Procedures (including critical care time)  Medications Ordered in UC Medications - No data to display  Initial Impression / Assessment and Plan / UC Course  I have reviewed the triage vital signs and the nursing notes.  Pertinent labs & imaging results that were available during my care of the patient were reviewed by me and considered in my medical decision making (see chart for details).   1.  Allergic rhinitis and nasal polyps Symptoms and physical exam consistent with allergic rhinitis etiology.  Patient to take cetirizine and Flonase daily and use olopatadine eyedrops as needed for symptoms.  Flonase will also help with her nasal polyps.  Advised patient to call her primary care provider for further assistance receiving Dupixent injections for ongoing management of nasal polyps and return to urgent care if symptoms do not improve or worsen.  Deferred imaging based on stable cardiopulmonary exam and hemodynamically stable vital signs at this time.  No indication for  viral testing.  Patient agreeable with plan.  2.  Essential hypertension Patient has been off of antihypertensive medications for the last at least 8 months.  She is agreeable to starting amlodipine 5 mg in the clinic for extremely elevated blood pressure at 183/116.  No focal neurologic deficit to exam.  No chest pain or shortness of breath.  She is well-appearing.  Advised PCP follow-up for medication management.  Advised DASH diet, low-salt intake, no red meat, and increase exercise.  No clinical indication for immediate referral to higher level of care.  Strict ED return precautions given.  Discussed physical exam and available lab work findings in clinic with patient.  Counseled patient regarding appropriate use of medications and potential side effects for all medications recommended or prescribed today. Discussed red flag signs and symptoms of worsening condition,when to call the PCP office, return to urgent care, and when to seek higher level of care in the emergency department. Patient verbalizes understanding and  agreement with plan. All questions answered. Patient discharged in stable condition.  Final Clinical Impressions(s) / UC Diagnoses   Final diagnoses:  Allergic rhinitis, unspecified seasonality, unspecified trigger  Nasal polyps  Essential hypertension     Discharge Instructions      Your symptoms are likely related to allergic rhinitis. Begin using Flonase once daily by placing 1 spray of medicine into each nostril. Take cetirizine once daily to dry up nasal mucus causing your cough. Use olopatadine eyedrops as needed for eye itching twice daily.  Limit the amount of salt in your diet. Take your blood pressure 3-4 times weekly and write these numbers down to keep a log. Follow-up with your primary care provider regarding your elevated blood pressure readings. Increase exercise.  Return to urgent care as needed. Go to nearest ER if symptoms become severe. I hope you  feel better!      ED Prescriptions     Medication Sig Dispense Auth. Provider   fluticasone (FLONASE) 50 MCG/ACT nasal spray Place 1 spray into both nostrils daily. 16 g Joella Prince M, FNP   cetirizine (ZYRTEC) 10 MG tablet Take 1 tablet (10 mg total) by mouth daily. 30 tablet Joella Prince M, FNP   olopatadine (PATANOL) 0.1 % ophthalmic solution Place 1 drop into both eyes 2 (two) times daily. 5 mL Joella Prince M, FNP   amLODipine (NORVASC) 5 MG tablet Take 1 tablet (5 mg total) by mouth daily. 30 tablet Joella Prince M, [place]   Blood Pressure Monitoring (BLOOD PRESSURE CUFF) MISC Take your blood pressure 3-4 times weekly and write them down. 1 each Talbot Grumbling, FNP      PDMP not reviewed this encounter.   Talbot Grumbling, [place] 05/12/22 Einar Crow

## 2022-05-12 NOTE — ED Triage Notes (Signed)
Pt presents with history of nasal polyps. Pt reports concern for same issue now x 4 days. Looking to get a medication to help.

## 2022-05-12 NOTE — Discharge Instructions (Signed)
Your symptoms are likely related to allergic rhinitis. Begin using Flonase once daily by placing 1 spray of medicine into each nostril. Take cetirizine once daily to dry up nasal mucus causing your cough. Use olopatadine eyedrops as needed for eye itching twice daily.  Limit the amount of salt in your diet. Take your blood pressure 3-4 times weekly and write these numbers down to keep a log. Follow-up with your primary care provider regarding your elevated blood pressure readings. Increase exercise.  Return to urgent care as needed. Go to nearest ER if symptoms become severe. I hope you feel better!

## 2023-02-15 ENCOUNTER — Other Ambulatory Visit: Payer: Self-pay

## 2023-02-15 ENCOUNTER — Emergency Department (HOSPITAL_COMMUNITY): Payer: Medicaid Other

## 2023-02-15 ENCOUNTER — Emergency Department (HOSPITAL_COMMUNITY)
Admission: EM | Admit: 2023-02-15 | Discharge: 2023-02-15 | Disposition: A | Discharge status: Home / Self Care | Payer: Medicaid Other | Attending: Emergency Medicine | Admitting: Emergency Medicine

## 2023-02-15 ENCOUNTER — Encounter (HOSPITAL_COMMUNITY): Payer: Self-pay

## 2023-02-15 ENCOUNTER — Encounter (HOSPITAL_COMMUNITY): Payer: Self-pay | Admitting: Emergency Medicine

## 2023-02-15 ENCOUNTER — Ambulatory Visit (HOSPITAL_COMMUNITY)
Admission: EM | Admit: 2023-02-15 | Discharge: 2023-02-15 | Disposition: A | Discharge status: Home / Self Care | Payer: Medicaid Other | Attending: Emergency Medicine | Admitting: Emergency Medicine

## 2023-02-15 DIAGNOSIS — R112 Nausea with vomiting, unspecified: Secondary | ICD-10-CM | POA: Diagnosis present

## 2023-02-15 DIAGNOSIS — K529 Noninfective gastroenteritis and colitis, unspecified: Secondary | ICD-10-CM | POA: Insufficient documentation

## 2023-02-15 DIAGNOSIS — I10 Essential (primary) hypertension: Secondary | ICD-10-CM | POA: Diagnosis not present

## 2023-02-15 DIAGNOSIS — Z79899 Other long term (current) drug therapy: Secondary | ICD-10-CM | POA: Insufficient documentation

## 2023-02-15 DIAGNOSIS — R1084 Generalized abdominal pain: Secondary | ICD-10-CM | POA: Diagnosis not present

## 2023-02-15 LAB — CBC WITH DIFFERENTIAL/PLATELET
Abs Immature Granulocytes: 0.02 10*3/uL (ref 0.00–0.07)
Basophils Absolute: 0.1 10*3/uL (ref 0.0–0.1)
Basophils Relative: 1 %
Eosinophils Absolute: 1.3 10*3/uL — ABNORMAL HIGH (ref 0.0–0.5)
Eosinophils Relative: 16 %
HCT: 35 % — ABNORMAL LOW (ref 36.0–46.0)
Hemoglobin: 10.3 g/dL — ABNORMAL LOW (ref 12.0–15.0)
Immature Granulocytes: 0 %
Lymphocytes Relative: 22 %
Lymphs Abs: 1.9 10*3/uL (ref 0.7–4.0)
MCH: 21.1 pg — ABNORMAL LOW (ref 26.0–34.0)
MCHC: 29.4 g/dL — ABNORMAL LOW (ref 30.0–36.0)
MCV: 71.9 fL — ABNORMAL LOW (ref 80.0–100.0)
Monocytes Absolute: 0.6 10*3/uL (ref 0.1–1.0)
Monocytes Relative: 7 %
Neutro Abs: 4.7 10*3/uL (ref 1.7–7.7)
Neutrophils Relative %: 54 %
Platelets: 361 10*3/uL (ref 150–400)
RBC: 4.87 MIL/uL (ref 3.87–5.11)
RDW: 18.5 % — ABNORMAL HIGH (ref 11.5–15.5)
WBC: 8.5 10*3/uL (ref 4.0–10.5)
nRBC: 0 % (ref 0.0–0.2)

## 2023-02-15 LAB — COMPREHENSIVE METABOLIC PANEL
ALT: 11 U/L (ref 0–44)
AST: 17 U/L (ref 15–41)
Albumin: 4 g/dL (ref 3.5–5.0)
Alkaline Phosphatase: 45 U/L (ref 38–126)
Anion gap: 13 (ref 5–15)
BUN: 8 mg/dL (ref 6–20)
CO2: 23 mmol/L (ref 22–32)
Calcium: 9.3 mg/dL (ref 8.9–10.3)
Chloride: 101 mmol/L (ref 98–111)
Creatinine, Ser: 0.82 mg/dL (ref 0.44–1.00)
GFR, Estimated: >60 mL/min (ref >60)
Glucose, Bld: 97 mg/dL (ref 70–99)
Potassium: 3.4 mmol/L — ABNORMAL LOW (ref 3.5–5.1)
Sodium: 137 mmol/L (ref 135–145)
Total Bilirubin: 0.2 mg/dL — ABNORMAL LOW (ref 0.3–1.2)
Total Protein: 7.7 g/dL (ref 6.5–8.1)

## 2023-02-15 LAB — POCT URINE PREGNANCY: Preg Test, Ur: NEGATIVE

## 2023-02-15 LAB — I-STAT BETA HCG BLOOD, ED (MC, WL, AP ONLY): I-stat hCG, quantitative: <5 m[IU]/mL (ref <5)

## 2023-02-15 LAB — ETHANOL: Alcohol, Ethyl (B): <10 mg/dL (ref <10)

## 2023-02-15 LAB — LIPASE, BLOOD: Lipase: 36 U/L (ref 11–51)

## 2023-02-15 MED ORDER — ONDANSETRON 4 MG PO TBDP: 4.0000 mg | ORAL_TABLET | Freq: Four times a day (QID) | ORAL | 0 refills | Status: DC | PRN

## 2023-02-15 MED ORDER — ALUM & MAG HYDROXIDE-SIMETH 200-200-20 MG/5ML PO SUSP
ORAL | Status: AC
  Filled 2023-02-15: qty 30

## 2023-02-15 MED ORDER — ALUM & MAG HYDROXIDE-SIMETH 200-200-20 MG/5ML PO SUSP
30.0000 mL | Freq: Once | ORAL | Status: AC
  Administered 2023-02-15: 30 mL via ORAL

## 2023-02-15 MED ORDER — DICYCLOMINE HCL 10 MG/ML IM SOLN
20.0000 mg | Freq: Once | INTRAMUSCULAR | Status: AC
  Administered 2023-02-15: 20 mg via INTRAMUSCULAR
  Filled 2023-02-15: qty 2

## 2023-02-15 MED ORDER — IOHEXOL 350 MG/ML SOLN
75.0000 mL | Freq: Once | INTRAVENOUS | Status: AC | PRN
  Administered 2023-02-15: 75 mL via INTRAVENOUS

## 2023-02-15 MED ORDER — DICYCLOMINE HCL 10 MG PO CAPS: 10.0000 mg | ORAL_CAPSULE | Freq: Three times a day (TID) | ORAL | 2 refills | Status: AC | PRN

## 2023-02-15 MED ORDER — ONDANSETRON 4 MG PO TBDP
ORAL_TABLET | ORAL | Status: AC
  Filled 2023-02-15: qty 1

## 2023-02-15 MED ORDER — DICYCLOMINE HCL 10 MG/5ML PO SOLN: 10.0000 mg | Freq: Once | ORAL | Status: DC

## 2023-02-15 MED ORDER — ONDANSETRON 4 MG PO TBDP
4.0000 mg | ORAL_TABLET | Freq: Once | ORAL | Status: AC
  Administered 2023-02-15: 4 mg via ORAL

## 2023-02-15 NOTE — ED Triage Notes (Signed)
Pt here POV from UC states she has been vomiting and diarrhea x8 days. Reports weakness and states this is similar to her IBS flare ups.

## 2023-02-15 NOTE — ED Provider Notes (Signed)
[facility] DEPARTMENT AT [facility] Provider Note   CSN: [id number] Arrival date & time: 02/15/23  1911     History  Chief Complaint  Patient presents with   Emesis   Diarrhea    Susan Walton is a 42 y.o. female.  Patient presents to the emergency department complaining of diarrhea, nausea, vomiting, lower abdominal pain which is been ongoing for approximately 8 days.  She was evaluated earlier today at an urgent care who consider discharging on home medications but the patient felt her pain was worsening.  Patient states this feels similar to her previous IBS flareups.  She denies fevers, chest pain, shortness of breath. Past medical history otherwise significant for hypertension, fibroids, alcoholism, H. pylori, reported IBS  HPI     Home Medications Prior to Admission medications   Medication Sig Start Date End Date Taking? Authorizing Provider  acetaminophen (TYLENOL) 500 MG tablet Take 1,000 mg by mouth every 4 (four) hours as needed for moderate pain, mild pain or headache.    [provider]  amLODipine (NORVASC) 5 MG tablet Take 1 tablet (5 mg total) by mouth daily. 05/12/22   Carlisle Beers, FNP  Blood Pressure Monitoring (BLOOD PRESSURE CUFF) MISC Take your blood pressure 3-4 times weekly and write them down. 05/12/22   Carlisle Beers, FNP  calcium carbonate (TUMS EX) 750 MG chewable tablet Chew 750-2,250 tablets (562,500-1,687,500 mg total) by mouth daily as needed for heartburn. 08/12/21   Rodolph Bong, MD  dicyclomine (BENTYL) 10 MG capsule Take 1 capsule (10 mg total) by mouth 3 (three) times daily as needed for spasms. 02/15/23   Rising, Lurena Joiner, PA-C  fluticasone (FLONASE) 50 MCG/ACT nasal spray Place 1 spray into both nostrils daily. 05/12/22   Carlisle Beers, FNP  iron polysaccharides (NIFEREX) 150 MG capsule Take 1 capsule (150 mg total) by mouth daily. 08/12/21   Rodolph Bong, MD  ondansetron (ZOFRAN-ODT) 4  MG disintegrating tablet Take 1 tablet (4 mg total) by mouth every 6 (six) hours as needed for nausea or vomiting. 02/15/23   Rising, Lurena Joiner, PA-C  labetalol (NORMODYNE) 100 MG tablet Take 1 tablet (100 mg total) by mouth 2 (two) times daily. 05/24/19 11/13/20  Wallis Bamberg, PA-C  NIFEdipine (PROCARDIA-XL/ADALAT CC) 60 MG 24 hr tablet Take 1 tablet (60 mg total) by mouth 2 (two) times daily. Patient not taking: Reported on 03/28/2019 12/13/17 05/24/19  Tereso Newcomer, MD  omeprazole (PRILOSEC) 40 MG capsule Take 1 capsule (40 mg total) by mouth daily. 30 minutes before breakfast Patient not taking: Reported on 03/28/2019 05/12/18 05/24/19  Napoleon Form, MD      Allergies    Other, Aspirin, and Naproxen    Review of Systems   Review of Systems  Physical Exam Updated Vital Signs BP (!) 133/93   Pulse 85   Temp 98 F (36.7 C)   Resp 12   Ht 5\' 7"  (1.702 m)   Wt 81.6 kg   LMP 02/01/2023 (Exact Date)   SpO2 98%   BMI 28.19 kg/m  Physical Exam HENT:     Head: Normocephalic.  Eyes:     Pupils: Pupils are equal, round, and reactive to light.  Cardiovascular:     Rate and Rhythm: Normal rate and regular rhythm.     Pulses: Normal pulses.  Pulmonary:     Effort: Pulmonary effort is normal.     Breath sounds: Normal breath sounds.  Abdominal:  Palpations: Abdomen is soft.     Tenderness: There is abdominal tenderness (Generalized).  Musculoskeletal:     Cervical back: Normal range of motion.  Skin:    General: Skin is dry.  Psychiatric:        Behavior: Behavior normal.     ED Results / Procedures / Treatments   Labs (all labs ordered are listed, but only abnormal results are displayed) Labs Reviewed  CBC WITH DIFFERENTIAL/PLATELET - Abnormal; Notable for the following components:      Result Value   Hemoglobin 10.3 (*)    HCT 35.0 (*)    MCV 71.9 (*)    MCH 21.1 (*)    MCHC 29.4 (*)    RDW 18.5 (*)    Eosinophils Absolute 1.3 (*)    All other components within  normal limits  COMPREHENSIVE METABOLIC PANEL - Abnormal; Notable for the following components:   Potassium 3.4 (*)    Total Bilirubin 0.2 (*)    All other components within normal limits  LIPASE, BLOOD  ETHANOL  URINALYSIS, ROUTINE W REFLEX MICROSCOPIC  RAPID URINE DRUG SCREEN, HOSP PERFORMED  I-STAT BETA HCG BLOOD, ED ([facility], [facility], [facility] ONLY)    EKG None  Radiology CT ABDOMEN PELVIS W CONTRAST  Result Date: 02/15/2023 CLINICAL DATA:  Abdominal pain, acute, nonlocalized Vomiting and diarrhea. EXAM: CT ABDOMEN AND PELVIS WITH CONTRAST TECHNIQUE: Multidetector CT imaging of the abdomen and pelvis was performed using the standard protocol following bolus administration of intravenous contrast. RADIATION DOSE REDUCTION: This exam was performed according to the departmental dose-optimization program which includes automated exposure control, adjustment of the mA and/or kV according to patient size and/or use of iterative reconstruction technique. CONTRAST:  75mL OMNIPAQUE IOHEXOL 350 MG/ML SOLN COMPARISON:  CT 06/27/2021 FINDINGS: Lower chest: Clear lung bases. Mild wall thickening of the distal esophagus. Hepatobiliary: Unremarkable appearance of the liver. The gallbladder is decompressed, no calcified gallstone or a gallbladder inflammation. No biliary dilatation. Pancreas: No ductal dilatation or inflammation. Spleen: Normal in size without focal abnormality. Splenule at the hilum. Adrenals/Urinary Tract: Normal adrenal glands. No hydronephrosis, focal renal abnormality or renal inflammation. Partially distended urinary bladder, normal for degree of distension. Stomach/Bowel: Colonic wall thickening with mild pericolonic edema of the ascending, transverse and descending colon. No perforation or pneumatosis. Normal appendix is visualized. No small bowel obstruction or small bowel inflammation. Unremarkable appearance of the stomach. Vascular/Lymphatic: Normal caliber abdominal aorta. Patent portal, splenic  and mesenteric veins. No abdominopelvic adenopathy. Reproductive: Heterogeneous uterus with fibroids. No adnexal mass. Other: No ascites or free air. Musculoskeletal: There are no acute or suspicious osseous abnormalities. IMPRESSION: 1. Colitis involving the ascending, transverse, and descending colon, likely infectious or inflammatory. 2. Mild wall thickening of the distal esophagus, can be seen with reflux or esophagitis. 3. Uterine fibroids. Electronically Signed   By: Narda Rutherford M.D.   On: 02/15/2023 22:33    Procedures Procedures    Medications Ordered in ED Medications  dicyclomine (BENTYL) injection 20 mg (20 mg Intramuscular Given 02/15/23 2113)  iohexol (OMNIPAQUE) 350 MG/ML injection 75 mL (75 mLs Intravenous Contrast Given 02/15/23 2223)    ED Course/ Medical Decision Making/ A&P                             Medical Decision Making Amount and/or Complexity of Data Reviewed Labs: ordered. Radiology: ordered.  Risk Prescription drug management.   This patient presents to the ED for concern  of abdominal pain, this involves an extensive number of treatment options, and is a complaint that carries with it a high risk of complications and morbidity.  The differential diagnosis includes colitis, cholecystitis, appendicitis, gastroenteritis, others   Co morbidities that complicate the patient evaluation  History of IBS   Additional history obtained:   External records from outside source obtained and reviewed including urgent care notes from earlier today   Lab Tests:  I Ordered, and personally interpreted labs.  The pertinent results include: Hemoglobin 10.3, improved from previous; grossly unremarkable CMP, normal lipase, negative pregnancy test   Imaging Studies ordered:  I ordered imaging studies including CT abdomen pelvis with contrast I independently visualized and interpreted imaging which showed  1. Colitis involving the ascending, transverse, and  descending  colon, likely infectious or inflammatory.  2. Mild wall thickening of the distal esophagus, can be seen with  reflux or esophagitis.  3. Uterine fibroids.   I agree with the radiologist interpretation   Problem List / ED Course / Critical interventions / Medication management   I ordered medication including Bentyl for abdominal pain Reevaluation of the patient after these medicines showed that the patient improved I have reviewed the patients home medicines and have made adjustments as needed   Social Determinants of Health:  Patient has Medicaid for primary health insurance type   Test / Admission - Considered:  Patient feels much better after Bentyl administration.  Patient has CT findings consistent with colitis.  Plan to discharge home with prescription for Bentyl and recommendations for follow-up with primary care.  Patient voices understanding with plan.  Return precautions provided         Final Clinical Impression(s) / ED Diagnoses Final diagnoses:  Colitis    Rx / DC Orders ED Discharge Orders     None         Pamala Duffel 02/15/23 2328    Eber Hong, MD 02/16/23 1217

## 2023-02-15 NOTE — ED Notes (Signed)
Patient is being discharged from the Urgent Care and sent to the Emergency Department via self . Per [facility], patient is in need of higher level of care due to abd pain. Patient is aware and verbalizes understanding of plan of care.  Vitals:   02/15/23 1743  BP: (!) 165/102  Pulse: 78  Resp: 16  Temp: 98.9 F (37.2 C)  SpO2: 97%

## 2023-02-15 NOTE — Discharge Instructions (Addendum)
Sent to ED

## 2023-02-15 NOTE — Discharge Instructions (Signed)
You were evaluated today for abdominal pain.  Your symptoms were consistent with colitis.  Bentyl improved your symptoms today.  You were prescribed Bentyl by urgent care earlier today with 2 refills.  Please take as directed.  Please follow-up as needed with your primary care provider.

## 2023-02-15 NOTE — ED Provider Notes (Signed)
[facility]    CSN: [id number] Arrival date & time: 02/15/23  1657      History   Chief Complaint Chief Complaint  Patient presents with   Abdominal Pain    HPI Susan Walton is a 42 y.o. female.  Here with 1 week history of abdominal discomfort Lower belly cramping. Feels this is related to her increased stress. History of IBS and sometimes has episodes like this. Vomiting after eating for a few days, last episode was yesterday. Able to tolerate fluids. No diarrhea/constipation - just softer stool. No blood, not dark/tarry No fever or chills Tried pepto with some relief  She is also requesting STD testing today. No known exposures but reports unprotected intercourse. No vaginal discharge, rash, itching. LMP 5/19   Past Medical History:  Diagnosis Date   Alcoholism (HCC)    no alcohol x 1 month per patient 06/2021   Anemia    Anxiety    Asthma attack 01/19/2021   Blind left eye    Depression    Fibroid    Helicobacter pylori gastritis    Hypertension    Retinal detachment     Patient Active Problem List   Diagnosis Date Noted   Essential hypertension 05/12/2022   Bronchiectasis (HCC) 08/12/2021   Aspiration pneumonia (HCC) 08/12/2021   Dyspnea 08/11/2021   Nausea and vomiting 08/11/2021   Acute respiratory failure with hypoxemia (HCC) 08/11/2021   Depression 06/27/2021   MDD (major depressive disorder), recurrent severe, 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   Schizoaffective disorder (HCC) 06/10/2021   PTSD (post-traumatic stress disorder) 06/10/2021   GAD (generalized anxiety disorder) 06/10/2021   Acute respiratory failure with hypoxia (HCC) 01/20/2021   Asthma attack 01/19/2021   Asthma 01/19/2021   Group B Streptococcus carrier, +RV culture, currently pregnant 12/10/2017   Gonorrhea affecting pregnancy in first trimester 12/10/2017   Depression affecting pregnancy in third trimester, antepartum 10/20/2017   Pregnancy, supervision, high-risk  07/27/2017   Chronic hypertension with superimposed severe preeclampsia 07/27/2017   Drug abuse during pregnancy (HCC) 07/27/2017   Thyroid dysfunction, antepartum 07/27/2017   History of trichomoniasis 07/27/2017   Unwanted fertility 07/27/2017   Advanced maternal age in multigravida, second trimester    Adjustment disorder with depressed mood 06/05/2017    Past Surgical History:  Procedure Laterality Date   DILATION AND CURETTAGE OF UTERUS     EYE SURGERY Left    TUBAL LIGATION N/A 12/11/2017   Procedure: POST PARTUM TUBAL LIGATION;  Surgeon: Catalina Antigua, MD;  Location: [facility];  Service: Gynecology;  Laterality: N/A;    OB History     Gravida  8   Para  5   Term  5   Preterm  0   AB  3   Living  5      SAB  2   IAB  1   Ectopic  0   Multiple  0   Live Births  5            Home Medications    Prior to Admission medications   Medication Sig Start Date End Date Taking? Authorizing Provider  acetaminophen (TYLENOL) 500 MG tablet Take 1,000 mg by mouth every 4 (four) hours as needed for moderate pain, mild pain or headache.   Yes [provider]  amLODipine (NORVASC) 5 MG tablet Take 1 tablet (5 mg total) by mouth daily. 05/12/22  Yes Carlisle Beers, FNP  dicyclomine (BENTYL) 10 MG capsule Take 1 capsule (10 mg  total) by mouth 3 (three) times daily as needed for spasms. 02/15/23  Yes Rosalin Buster, Lurena Joiner, PA-C  fluticasone (FLONASE) 50 MCG/ACT nasal spray Place 1 spray into both nostrils daily. 05/12/22  Yes Carlisle Beers, FNP  iron polysaccharides (NIFEREX) 150 MG capsule Take 1 capsule (150 mg total) by mouth daily. 08/12/21  Yes Rodolph Bong, MD  ondansetron (ZOFRAN-ODT) 4 MG disintegrating tablet Take 1 tablet (4 mg total) by mouth every 6 (six) hours as needed for nausea or vomiting. 02/15/23  Yes Sparrow Siracusa, Lurena Joiner, PA-C  Blood Pressure Monitoring (BLOOD PRESSURE CUFF) MISC Take your blood pressure 3-4 times weekly and  write them down. 05/12/22   Carlisle Beers, FNP  calcium carbonate (TUMS EX) 750 MG chewable tablet Chew 750-2,250 tablets (562,500-1,687,500 mg total) by mouth daily as needed for heartburn. 08/12/21   Rodolph Bong, MD  labetalol (NORMODYNE) 100 MG tablet Take 1 tablet (100 mg total) by mouth 2 (two) times daily. 05/24/19 11/13/20  Wallis Bamberg, PA-C  NIFEdipine (PROCARDIA-XL/ADALAT CC) 60 MG 24 hr tablet Take 1 tablet (60 mg total) by mouth 2 (two) times daily. Patient not taking: Reported on 03/28/2019 12/13/17 05/24/19  Tereso Newcomer, MD  omeprazole (PRILOSEC) 40 MG capsule Take 1 capsule (40 mg total) by mouth daily. 30 minutes before breakfast Patient not taking: Reported on 03/28/2019 05/12/18 05/24/19  Napoleon Form, MD    Family History Family History  Problem Relation Age of Onset   Hypertension Mother    Hypertension Father    COPD Maternal Grandfather    Liver disease Paternal Grandmother    Liver disease Paternal Grandfather     Social History Social History   Tobacco Use   Smoking status: Every Day    Packs/day: .5    Types: Cigarettes   Smokeless tobacco: Never  Vaping Use   Vaping Use: Never used  Substance Use Topics   Alcohol use: Yes    Comment: 4 per day   Drug use: Not Currently     Allergies   Other, Aspirin, and Naproxen   Review of Systems Review of Systems As per HPI  Physical Exam Triage Vital Signs ED Triage Vitals [02/15/23 1743]  Enc Vitals Group     BP (!) 165/102     Pulse Rate 78     Resp 16     Temp 98.9 F (37.2 C)     Temp Source Oral     SpO2 97 %     Weight 180 lb (81.6 kg)     Height 5\' 7"  (1.702 m)     Head Circumference      Peak Flow      Pain Score 10     Pain Loc      Pain Edu?      Excl. in GC?    No data found.  Updated Vital Signs BP (!) 165/102 (BP Location: Right Arm)   Pulse 78   Temp 98.9 F (37.2 C) (Oral)   Resp 16   Ht 5\' 7"  (1.702 m)   Wt 180 lb (81.6 kg)   LMP 02/01/2023 (Exact  Date)   SpO2 97%   BMI 28.19 kg/m   Physical Exam Vitals and nursing note reviewed.  Constitutional:      Appearance: Normal appearance.  HENT:     Mouth/Throat:     Mouth: Mucous membranes are moist.     Pharynx: Oropharynx is clear.  Eyes:     Conjunctiva/sclera: Conjunctivae normal.  Cardiovascular:     Rate and Rhythm: Normal rate and regular rhythm.     Heart sounds: Normal heart sounds.  Pulmonary:     Effort: Pulmonary effort is normal.     Breath sounds: Normal breath sounds.  Abdominal:     General: Bowel sounds are normal.     Palpations: Abdomen is soft.     Tenderness: There is generalized abdominal tenderness. There is no right CVA tenderness, left CVA tenderness, guarding or rebound. Negative signs include Rovsing's sign and McBurney's sign.     Comments: No point tenderness, just uncomfortable throughout   Musculoskeletal:        General: Normal range of motion.     Cervical back: Normal range of motion.  Skin:    General: Skin is warm and dry.  Neurological:     Mental Status: She is alert and oriented to person, place, and time.    UC Treatments / Results  Labs (all labs ordered are listed, but only abnormal results are displayed) Labs Reviewed  POCT URINE PREGNANCY  CERVICOVAGINAL ANCILLARY ONLY    EKG   Radiology No results found.  Procedures Procedures   Medications Ordered in UC Medications  ondansetron (ZOFRAN-ODT) disintegrating tablet 4 mg (4 mg Oral Given 02/15/23 1843)  alum & mag hydroxide-simeth (MAALOX/MYLANTA) 200-200-20 MG/5ML suspension 30 mL (30 mLs Oral Given 02/15/23 1843)    Initial Impression / Assessment and Plan / UC Course  I have reviewed the triage vital signs and the nursing notes.  Pertinent labs & imaging results that were available during my care of the patient were reviewed by me and considered in my medical decision making (see chart for details).  UPT negative Unable to provide enough urine for UA  Bentyl  not available in clinic. Given maalox and zofran   At discharge we were discussing medication use at home. Patient became tearful and stating she's feeling worse. We discussed being evaluated in the ED tonight. She would like to have imaging of her belly done. Friend is available for transport to hospital.  Final Clinical Impressions(s) / UC Diagnoses   Final diagnoses:  Generalized abdominal pain     Discharge Instructions      Sent to ED       ED Prescriptions     Medication Sig Dispense Auth. Provider   ondansetron (ZOFRAN-ODT) 4 MG disintegrating tablet Take 1 tablet (4 mg total) by mouth every 6 (six) hours as needed for nausea or vomiting. 20 tablet Autum Benfer, PA-C   dicyclomine (BENTYL) 10 MG capsule Take 1 capsule (10 mg total) by mouth 3 (three) times daily as needed for spasms. 30 capsule Millie Forde, Lurena Joiner, PA-C      PDMP not reviewed this encounter.   Kathrine Haddock 02/15/23 1909

## 2023-02-15 NOTE — ED Triage Notes (Signed)
Cannot triage she's in the br

## 2023-02-15 NOTE — ED Triage Notes (Signed)
Patient here today with c/o abd cramping, diarrhea, N&V X 8 days. She cannot keep food down. She take Pepto Bismol which helps sometimes. She has a h/o IBS.   She also has nasal polyps X 1 month and having difficulty breathing X 3 days. She states that she feels lightheaded at times. Takes Claritin which helps sometimes.   She would also like to be tested all STDs.

## 2023-02-15 NOTE — ED Notes (Signed)
Patient verbalizes understanding of discharge instructions. Opportunity for questioning and answers were provided. Armband removed by staff, pt discharged from ED. Patient taken to ED waiting room via wheelchair.

## 2023-02-16 LAB — CERVICOVAGINAL ANCILLARY ONLY
Bacterial Vaginitis (gardnerella): POSITIVE — AB
Candida Glabrata: NEGATIVE
Candida Vaginitis: POSITIVE — AB
Chlamydia: NEGATIVE
Comment: NEGATIVE
Comment: NEGATIVE
Comment: NEGATIVE
Comment: NEGATIVE
Comment: NEGATIVE
Comment: NORMAL
Neisseria Gonorrhea: NEGATIVE
Trichomonas: NEGATIVE

## 2023-02-19 ENCOUNTER — Telehealth (HOSPITAL_COMMUNITY): Payer: Self-pay | Admitting: Emergency Medicine

## 2023-02-19 MED ORDER — METRONIDAZOLE 500 MG PO TABS: 500.0000 mg | ORAL_TABLET | Freq: Two times a day (BID) | ORAL | 0 refills | Status: AC

## 2023-02-19 MED ORDER — FLUCONAZOLE 150 MG PO TABS: 150.0000 mg | ORAL_TABLET | Freq: Once | ORAL | 0 refills | Status: AC

## 2023-03-05 ENCOUNTER — Other Ambulatory Visit: Payer: Self-pay

## 2023-03-05 ENCOUNTER — Ambulatory Visit (HOSPITAL_COMMUNITY)
Admission: EM | Admit: 2023-03-05 | Discharge: 2023-03-05 | Disposition: A | Discharge status: Home / Self Care | Payer: Medicaid Other | Attending: Emergency Medicine | Admitting: Emergency Medicine

## 2023-03-05 ENCOUNTER — Encounter (HOSPITAL_COMMUNITY): Payer: Self-pay | Admitting: Emergency Medicine

## 2023-03-05 DIAGNOSIS — Z8679 Personal history of other diseases of the circulatory system: Secondary | ICD-10-CM | POA: Diagnosis not present

## 2023-03-05 DIAGNOSIS — R030 Elevated blood-pressure reading, without diagnosis of hypertension: Secondary | ICD-10-CM | POA: Diagnosis not present

## 2023-03-05 DIAGNOSIS — R0981 Nasal congestion: Secondary | ICD-10-CM | POA: Diagnosis not present

## 2023-03-05 MED ORDER — FLUTICASONE PROPIONATE 50 MCG/ACT NA SUSP: 1.0000 | Freq: Every day | NASAL | 0 refills | Status: DC

## 2023-03-05 NOTE — ED Triage Notes (Signed)
Pt clo nasal congestion and nasal Po lips requesting medication for it. Pt endorse she is out of her BP meds.

## 2023-03-05 NOTE — ED Provider Notes (Signed)
[facility]    CSN: [id number] Arrival date & time: 03/05/23  1526      History   Chief Complaint Chief Complaint  Patient presents with   Nasal Congestion    Pt clo nasal congestion and nasal Po lips requesting medication for it. Pt endorse she is out of her BP meds.    HPI Jaynae Masarik is a 42 y.o. female.   42 year old female, Lovisa Misner, presents to urgent care requesting medication for nasal polyps.  Patient states she has not taken her blood pressure medication,  has follow-up appointment with ENT.  The history is provided by the patient. No language interpreter was used.    Past Medical History:  Diagnosis Date   Alcoholism (HCC)    no alcohol x 1 month per patient 06/2021   Anemia    Anxiety    Asthma attack 01/19/2021   Blind left eye    Depression    Fibroid    Helicobacter pylori gastritis    Hypertension    Retinal detachment     Patient Active Problem List   Diagnosis Date Noted   Nasal congestion 03/05/2023   History of hypertension 03/05/2023   Elevated blood pressure reading 03/05/2023   Essential hypertension 05/12/2022   Bronchiectasis (HCC) 08/12/2021   Aspiration pneumonia (HCC) 08/12/2021   Dyspnea 08/11/2021   Nausea and vomiting 08/11/2021   Acute respiratory failure with hypoxemia (HCC) 08/11/2021   Depression 06/27/2021   MDD (major depressive disorder), recurrent severe, 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   Schizoaffective disorder (HCC) 06/10/2021   PTSD (post-traumatic stress disorder) 06/10/2021   GAD (generalized anxiety disorder) 06/10/2021   Acute respiratory failure with hypoxia (HCC) 01/20/2021   Asthma attack 01/19/2021   Asthma 01/19/2021   Group B Streptococcus carrier, +RV culture, currently pregnant 12/10/2017   Gonorrhea affecting pregnancy in first trimester 12/10/2017   Depression affecting pregnancy in third trimester, antepartum 10/20/2017   Pregnancy, supervision, high-risk 07/27/2017   Chronic  hypertension with superimposed severe preeclampsia 07/27/2017   Drug abuse during pregnancy (HCC) 07/27/2017   Thyroid dysfunction, antepartum 07/27/2017   History of trichomoniasis 07/27/2017   Unwanted fertility 07/27/2017   Advanced maternal age in multigravida, second trimester    Adjustment disorder with depressed mood 06/05/2017    Past Surgical History:  Procedure Laterality Date   DILATION AND CURETTAGE OF UTERUS     EYE SURGERY Left    TUBAL LIGATION N/A 12/11/2017   Procedure: POST PARTUM TUBAL LIGATION;  Surgeon: Catalina Antigua, MD;  Location: [facility];  Service: Gynecology;  Laterality: N/A;    OB History     Gravida  8   Para  5   Term  5   Preterm  0   AB  3   Living  5      SAB  2   IAB  1   Ectopic  0   Multiple  0   Live Births  5            Home Medications    Prior to Admission medications   Medication Sig Start Date End Date Taking? Authorizing Provider  fluticasone (FLONASE) 50 MCG/ACT nasal spray Place 1 spray into both nostrils daily. 03/05/23  Yes Mychael Smock, Para March, NP  acetaminophen (TYLENOL) 500 MG tablet Take 1,000 mg by mouth every 4 (four) hours as needed for moderate pain, mild pain or headache.    [provider]  amLODipine (NORVASC) 5 MG tablet Take 1  tablet (5 mg total) by mouth daily. 05/12/22   Carlisle Beers, FNP  Blood Pressure Monitoring (BLOOD PRESSURE CUFF) MISC Take your blood pressure 3-4 times weekly and write them down. 05/12/22   Carlisle Beers, FNP  calcium carbonate (TUMS EX) 750 MG chewable tablet Chew 750-2,250 tablets (562,500-1,687,500 mg total) by mouth daily as needed for heartburn. 08/12/21   Rodolph Bong, MD  dicyclomine (BENTYL) 10 MG capsule Take 1 capsule (10 mg total) by mouth 3 (three) times daily as needed for spasms. 02/15/23   Rising, Lurena Joiner, PA-C  iron polysaccharides (NIFEREX) 150 MG capsule Take 1 capsule (150 mg total) by mouth daily. 08/12/21   Rodolph Bong, MD  metroNIDAZOLE (FLAGYL) 500 MG tablet Take 1 tablet (500 mg total) by mouth 2 (two) times daily. 02/19/23   Lamptey, Britta Mccreedy, MD  ondansetron (ZOFRAN-ODT) 4 MG disintegrating tablet Take 1 tablet (4 mg total) by mouth every 6 (six) hours as needed for nausea or vomiting. 02/15/23   Rising, Lurena Joiner, PA-C  labetalol (NORMODYNE) 100 MG tablet Take 1 tablet (100 mg total) by mouth 2 (two) times daily. 05/24/19 11/13/20  Wallis Bamberg, PA-C  NIFEdipine (PROCARDIA-XL/ADALAT CC) 60 MG 24 hr tablet Take 1 tablet (60 mg total) by mouth 2 (two) times daily. Patient not taking: Reported on 03/28/2019 12/13/17 05/24/19  Tereso Newcomer, MD  omeprazole (PRILOSEC) 40 MG capsule Take 1 capsule (40 mg total) by mouth daily. 30 minutes before breakfast Patient not taking: Reported on 03/28/2019 05/12/18 05/24/19  Napoleon Form, MD    Family History Family History  Problem Relation Age of Onset   Hypertension Mother    Hypertension Father    COPD Maternal Grandfather    Liver disease Paternal Grandmother    Liver disease Paternal Grandfather     Social History Social History   Tobacco Use   Smoking status: Every Day    Packs/day: .5    Types: Cigarettes   Smokeless tobacco: Never  Vaping Use   Vaping Use: Never used  Substance Use Topics   Alcohol use: Yes    Comment: 4 per day   Drug use: Not Currently     Allergies   Other, Aspirin, and Naproxen   Review of Systems Review of Systems  HENT:  Positive for congestion.   All other systems reviewed and are negative.    Physical Exam Triage Vital Signs ED Triage Vitals [03/05/23 1627]  Enc Vitals Group     BP (!) 157/96     Pulse Rate 77     Resp 18     Temp 98 F (36.7 C)     Temp Source Oral     SpO2 98 %     Weight      Height      Head Circumference      Peak Flow      Pain Score 0     Pain Loc      Pain Edu?      Excl. in GC?    No data found.  Updated Vital Signs BP (!) 157/96 (BP Location: Right Arm)    Pulse 77   Temp 98 F (36.7 C) (Oral)   Resp 18   LMP 02/01/2023 (Exact Date)   SpO2 98%   Visual Acuity Right Eye Distance:   Left Eye Distance:   Bilateral Distance:    Right Eye Near:   Left Eye Near:    Bilateral Near:  Physical Exam Vitals and nursing note reviewed.  HENT:     Head: Normocephalic.     Nose: Congestion present.      UC Treatments / Results  Labs (all labs ordered are listed, but only abnormal results are displayed) Labs Reviewed - No data to display  EKG   Radiology No results found.  Procedures Procedures (including critical care time)  Medications Ordered in UC Medications - No data to display  Initial Impression / Assessment and Plan / UC Course  I have reviewed the triage vital signs and the nursing notes.  Pertinent labs & imaging results that were available during my care of the patient were reviewed by me and considered in my medical decision making (see chart for details).     Ddx: Nasal congestion, nasal polyps, history of hypertension, elevated blood pressure Final Clinical Impressions(s) / UC Diagnoses   Final diagnoses:  Nasal congestion  History of hypertension  Elevated blood pressure reading     Discharge Instructions      Use Flonase as directed, follow-up with ENT as scheduled.  Follow-up with PCP regarding regular medical issues, blood pressure medication renewal.  Return as needed    ED Prescriptions     Medication Sig Dispense Auth. Provider   fluticasone (FLONASE) 50 MCG/ACT nasal spray Place 1 spray into both nostrils daily. 15.8 mL Maria Gallicchio, Para March, NP      PDMP not reviewed this encounter.   Clancy Gourd, NP 03/05/23 1659

## 2023-03-05 NOTE — Discharge Instructions (Addendum)
Use Flonase as directed, follow-up with ENT as scheduled.  Follow-up with PCP regarding regular medical issues, blood pressure medication renewal.  Return as needed

## 2023-04-10 ENCOUNTER — Other Ambulatory Visit: Payer: Self-pay | Admitting: Internal Medicine

## 2023-04-10 DIAGNOSIS — D539 Nutritional anemia, unspecified: Secondary | ICD-10-CM

## 2023-04-11 ENCOUNTER — Inpatient Hospital Stay: Payer: MEDICAID

## 2023-04-11 ENCOUNTER — Inpatient Hospital Stay: Payer: MEDICAID | Attending: Internal Medicine | Admitting: Internal Medicine

## 2023-04-11 NOTE — Progress Notes (Deleted)
[facility] [phone]   [phone]  CONSULT NOTE  REFERRING PHYSICIAN: Roselyn Reef, NP  REASON FOR CONSULTATION:  42 years old African-American female with persistent anemia.  HPI Susan Walton is a 42 y.o. female with past medical history significant for alcohol abuse, persistent anemia, anxiety, asthma, depression, blind in the left eye, fibroid tumor of the uterus, H. pylori gastritis, hypertension and retinal detachment.  The patient was seen by her primary care provider recently and she was found to have persistent anemia.  Her last blood work on 03/18/2023 showed serum iron of 25 with iron saturation of 5% and TIBC of 479.  Her CBC on 02/15/2023 showed normal total white blood count of 8.5 as well as normal platelet count of 361 but the patient had anemia with hemoglobin of 10.3 and hematocrit 35.0% with MCV of 71.9.  She is currently on iron supplement with Niferex 150 mg p.o. daily. She was referred to me today for evaluation and recommendation regarding her condition. HPI  Past Medical History:  Diagnosis Date  . Alcoholism (HCC)    no alcohol x 1 month per patient 06/2021  . Anemia   . Anxiety   . Asthma attack 01/19/2021  . Blind left eye   . Depression   . Fibroid   . Helicobacter pylori gastritis   . Hypertension   . Retinal detachment     Past Surgical History:  Procedure Laterality Date  . DILATION AND CURETTAGE OF UTERUS    . EYE SURGERY Left   . TUBAL LIGATION N/A 12/11/2017   Procedure: POST PARTUM TUBAL LIGATION;  Surgeon: Catalina Antigua, MD;  Location: [facility];  Service: Gynecology;  Laterality: N/A;    Family History  Problem Relation Age of Onset  . Hypertension Mother   . Hypertension Father   . COPD Maternal Grandfather   . Liver disease Paternal Grandmother   . Liver disease Paternal Grandfather     Social History Social History   Tobacco Use  . Smoking status: Every Day    Current  packs/day: 0.50    Types: Cigarettes  . Smokeless tobacco: Never  Vaping Use  . Vaping status: Never Used  Substance Use Topics  . Alcohol use: Yes    Comment: 4 per day  . Drug use: Not Currently    Allergies  Allergen Reactions  . Other Other (See Comments)    Unnamed antibiotic- Bradycardia (received when hospitalized in May, 2022- received Rocephin, Azithromycin, and Augmentin)  . Aspirin   . Naproxen     Current Outpatient Medications  Medication Sig Dispense Refill  . acetaminophen (TYLENOL) 500 MG tablet Take 1,000 mg by mouth every 4 (four) hours as needed for moderate pain, mild pain or headache.    Marland Kitchen amLODipine (NORVASC) 5 MG tablet Take 1 tablet (5 mg total) by mouth daily. 30 tablet 1  . Blood Pressure Monitoring (BLOOD PRESSURE CUFF) MISC Take your blood pressure 3-4 times weekly and write them down. 1 each 0  . calcium carbonate (TUMS EX) 750 MG chewable tablet Chew 750-2,250 tablets (562,500-1,687,500 mg total) by mouth daily as needed for heartburn.    . dicyclomine (BENTYL) 10 MG capsule Take 1 capsule (10 mg total) by mouth 3 (three) times daily as needed for spasms. 30 capsule 2  . fluticasone (FLONASE) 50 MCG/ACT nasal spray Place 1 spray into both nostrils daily. 15.8 mL 0  . iron polysaccharides (NIFEREX) 150 MG capsule Take 1 capsule (150  mg total) by mouth daily. 30 capsule 1  . metroNIDAZOLE (FLAGYL) 500 MG tablet Take 1 tablet (500 mg total) by mouth 2 (two) times daily. 14 tablet 0  . ondansetron (ZOFRAN-ODT) 4 MG disintegrating tablet Take 1 tablet (4 mg total) by mouth every 6 (six) hours as needed for nausea or vomiting. 20 tablet 0   Current Facility-Administered Medications  Medication Dose Route Frequency Provider Last Rate Last Admin  . capsicum (ZOSTRIX) 0.075 % cream   Topical QID Imogene Burn, MD        Review of Systems  {Ros - complete:30496}  Physical Exam  RAL:{CHL ONC PE GENERAL:[phone]} SKIN: {CHL ONC PE  FYBO:1751025852} HEAD: {CHL ONC PE DPOE:4235361443} EYES: {CHL ONC PE XVQM:0867619509} EARS: {CHL ONC PE TOIZ:1245809983} OROPHARYNX:{CHL ONC PE OROPHARYNX:201-709-1079}  NECK: {CHL ONC PE JASN:0539767341} LYMPH:  {CHL ONC PE PFXTK:2409735329} BREAST:{CHL ONC PE JMEQAS:3419622297} LUNGS: {CHL ONC PE LGXQJ:1941740814} HEART: {CHL ONC PE GYJEH:6314970263} ABDOMEN:{CHL ONC PE ABDOMEN:[phone]} BACK: {CHL ONC PE ZCHY:8502774128} EXTREMITIES:{CHL ONC PE EXTREMITIES:715-310-1275}  NEURO: {CHL ONC PE NEURO:352-057-5898}  PERFORMANCE STATUS: ECOG ***  LABORATORY DATA: Lab Results  Component Value Date   WBC 8.5 02/15/2023   HGB 10.3 (L) 02/15/2023   HCT 35.0 (L) 02/15/2023   MCV 71.9 (L) 02/15/2023   PLT 361 02/15/2023      Chemistry      Component Value Date/Time   NA 137 02/15/2023 2116   NA 140 07/27/2017 1545   K 3.4 (L) 02/15/2023 2116   CL 101 02/15/2023 2116   CO2 23 02/15/2023 2116   BUN 8 02/15/2023 2116   BUN 8 07/27/2017 1545   CREATININE 0.82 02/15/2023 2116      Component Value Date/Time   CALCIUM 9.3 02/15/2023 2116   ALKPHOS 45 02/15/2023 2116   AST 17 02/15/2023 2116   ALT 11 02/15/2023 2116   BILITOT 0.2 (L) 02/15/2023 2116   BILITOT <0.2 07/27/2017 1545       RADIOGRAPHIC STUDIES: No results found.  ASSESSMENT:   PLAN:  The patient voices understanding of current disease status and treatment options and is in agreement with the current care plan.  All questions were answered. The patient knows to call the clinic with any problems, questions or concerns. We can certainly see the patient much sooner if necessary.  Thank you so much for allowing me to participate in the care of [facility]. I will continue to follow up the patient with you and assist in her care.  I spent {CHL ONC TIME VISIT - NOMVE:7209470962} counseling the patient face to face. The total time spent in the appointment was {CHL ONC TIME VISIT - EZMOQ:9476546503}.  Disclaimer:  This note was dictated with voice recognition software. Similar sounding words can inadvertently be transcribed and may not be corrected upon review.   Lajuana Matte April 11, 2023, 8:04 AM

## 2023-04-17 ENCOUNTER — Other Ambulatory Visit: Payer: Self-pay | Admitting: Family

## 2023-04-17 DIAGNOSIS — Z1231 Encounter for screening mammogram for malignant neoplasm of breast: Secondary | ICD-10-CM

## 2023-04-27 ENCOUNTER — Ambulatory Visit: Payer: MEDICAID

## 2023-04-28 ENCOUNTER — Ambulatory Visit: Payer: MEDICAID

## 2023-04-29 ENCOUNTER — Ambulatory Visit: Payer: MEDICAID

## 2023-04-29 ENCOUNTER — Ambulatory Visit
Admission: RE | Admit: 2023-04-29 | Discharge: 2023-04-29 | Disposition: A | Discharge status: Home / Self Care | Payer: MEDICAID | Source: Ambulatory Visit | Attending: Family | Admitting: Family

## 2023-04-29 DIAGNOSIS — Z1231 Encounter for screening mammogram for malignant neoplasm of breast: Secondary | ICD-10-CM

## 2023-06-10 ENCOUNTER — Ambulatory Visit (INDEPENDENT_AMBULATORY_CARE_PROVIDER_SITE_OTHER): Payer: MEDICAID | Admitting: Internal Medicine

## 2023-06-10 ENCOUNTER — Encounter: Payer: Self-pay | Admitting: Internal Medicine

## 2023-06-10 ENCOUNTER — Other Ambulatory Visit: Payer: Self-pay

## 2023-06-10 VITALS — BP 128/100 | HR 86 | Temp 98.4°F | Ht 66.54 in | Wt 173.7 lb

## 2023-06-10 DIAGNOSIS — J324 Chronic pansinusitis: Secondary | ICD-10-CM | POA: Diagnosis not present

## 2023-06-10 DIAGNOSIS — J301 Allergic rhinitis due to pollen: Secondary | ICD-10-CM | POA: Diagnosis not present

## 2023-06-10 DIAGNOSIS — J4540 Moderate persistent asthma, uncomplicated: Secondary | ICD-10-CM | POA: Diagnosis not present

## 2023-06-10 DIAGNOSIS — J3081 Allergic rhinitis due to animal (cat) (dog) hair and dander: Secondary | ICD-10-CM

## 2023-06-10 DIAGNOSIS — J339 Nasal polyp, unspecified: Secondary | ICD-10-CM

## 2023-06-10 MED ORDER — ALBUTEROL SULFATE HFA 108 (90 BASE) MCG/ACT IN AERS: 2.0000 | INHALATION_SPRAY | Freq: Four times a day (QID) | RESPIRATORY_TRACT | 1 refills | Status: DC | PRN

## 2023-06-10 MED ORDER — CETIRIZINE HCL 10 MG PO TABS: 10.0000 mg | ORAL_TABLET | Freq: Every day | ORAL | 5 refills | Status: DC

## 2023-06-10 MED ORDER — BUDESONIDE-FORMOTEROL FUMARATE 160-4.5 MCG/ACT IN AERO: 2.0000 | INHALATION_SPRAY | Freq: Two times a day (BID) | RESPIRATORY_TRACT | 5 refills | Status: DC

## 2023-06-10 MED ORDER — FLUTICASONE PROPIONATE 50 MCG/ACT NA SUSP: 2.0000 | Freq: Every day | NASAL | 5 refills | Status: DC

## 2023-06-10 MED ORDER — AZELASTINE HCL 0.1 % NA SOLN: 1.0000 | Freq: Two times a day (BID) | NASAL | 5 refills | Status: AC | PRN

## 2023-06-10 NOTE — Patient Instructions (Addendum)
Allergic Rhinitis: Chronic Sinusitis with Nasal Polyps   - Positive skin test 05/2023: trees, grasses, weeds, dogs  - Avoidance measures discussed. - Use nasal saline rinses before nose sprays such as with Neilmed Sinus Rinse.  Use distilled water.   - Use Flonase 2 sprays each nostril daily. Aim upward and outward. - Use Azelastine 2 sprays each nostril twice daily. Aim upward and outward. - Use Zyrtec 10 mg daily.  - Consider allergy shots as long term control of your symptoms by teaching your immune system to be more tolerant of your allergy triggers - Recommend starting Dupixent 300mg  every other week due to severe nasal polyps and uncontrolled asthma.  I will message Tammy regarding this.    Moderate Persistent Asthma: - Maintenance inhaler: start Symbicort 160-4.79mcg 2 puffs twice daily with spacer.  - Rescue inhaler: Albuterol 2 puffs via spacer or 1 vial via nebulizer every 4-6 hours as needed for respiratory symptoms of cough, shortness of breath, or wheezing Asthma control goals:  Full participation in all desired activities (may need albuterol before activity) Albuterol use two times or less a week on average (not counting use with activity) Cough interfering with sleep two times or less a month Oral steroids no more than once a year No hospitalizations

## 2023-06-10 NOTE — Progress Notes (Signed)
NEW PATIENT  Date of Service/Encounter:  06/10/23  Consult requested by: Donato Schultz, FNP   Subjective:   Susan Walton (DOB: [birth year]) is a 42 y.o. female who presents to the clinic on 06/10/2023 with a chief complaint of Nasal Polyps, Asthma, Wheezing, Running nose, Shortness of Breath, Nasal Congestion, Breathing Problem, and Establish Care .    History obtained from: chart review and patient.   Discussed the use of AI scribe software for clinical note transcription with the patient, who gave verbal consent to proceed.  History of Present Illness   The patient, with a history of chronic sinusitis, allergies and asthma, presents for evaluation. She reports severe nasal congestion, drainage, and runny nose that began in August 2023. The symptoms are year-round and not associated with any seasonal changes. She has been using Flonase nasal spray daily, but the symptoms persist. 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 Saw ENT recently who noted severe nasal polyposis.  She also has a history of nasal polyps and has been told by an ear, nose, and throat specialist that she is not a candidate for surgery due to the recurring nature of the polyps. She has never had sinus surgery but has had surgery over her eye.      In the past, she was on Dupixent for six months at [facility], which helped with her symptoms. However, she stopped the medication due to personal reasons and distractions, not because of any side effects. She has never undergone allergy skin testing.  Regarding her asthma, she was diagnosed around the same time the sinus issues started. She uses Albuterol inhaler as needed, but she has not needed it several months.. Despite this, she has had to visit the emergency room three times in the past year due to breathing-related issues and has needed oral prednisone each time. She does report frequent trouble with shortness of breath and intermittent wheezing. Not sure why she doesn't try the  Albuterol.     Past Medical History: Past Medical History:  Diagnosis Date   Alcoholism (HCC)    no alcohol x 1 month per patient 06/2021   Anemia    Anxiety    Asthma attack 01/19/2021   Blind left eye    Depression    Fibroid    Helicobacter pylori gastritis    Hypertension    Retinal detachment     Past Surgical History: Past Surgical History:  Procedure Laterality Date   DILATION AND CURETTAGE OF UTERUS     EYE SURGERY Left    TUBAL LIGATION N/A 12/11/2017   Procedure: POST PARTUM TUBAL LIGATION;  Surgeon: Catalina Antigua, MD;  Location: [facility];  Service: Gynecology;  Laterality: N/A;    Family History: Family History  Problem Relation Age of Onset   Hypertension Mother    Allergic rhinitis Father    Hypertension Father    COPD Maternal Grandfather    Liver disease Paternal Grandmother    Liver disease Paternal Grandfather     Social History:  Flooring in bedroom: laminate Pets: dog Tobacco use/exposure: started smoking 32 years ago, smokes 4 blunts/day Job: none  Medication List:  Allergies as of 06/10/2023       Reactions   Other Other (See Comments)   Unnamed antibiotic- Bradycardia (received when hospitalized in May, 2022- received Rocephin, Azithromycin, and Augmentin)   Aspirin    Naproxen         Medication List        Accurate as  of June 10, 2023 11:58 AM. If you have any questions, ask your nurse or doctor.          acetaminophen 500 MG tablet Commonly known as: TYLENOL Take 1,000 mg by mouth every 4 (four) hours as needed for moderate pain, mild pain or headache.   amLODipine 5 MG tablet Commonly known as: NORVASC Take 1 tablet (5 mg total) by mouth daily.   Blood Pressure Cuff Misc Take your blood pressure 3-4 times weekly and write them down.   calcium carbonate 750 MG chewable tablet Commonly known as: TUMS EX Chew 750-2,250 tablets (562,500-1,687,500 mg total) by mouth daily as needed for heartburn.    dicyclomine 10 MG capsule Commonly known as: BENTYL Take 1 capsule (10 mg total) by mouth 3 (three) times daily as needed for spasms.   Dupilumab 300 MG/2ML Sopn Inject 2 mLs (300 mg total) into the skin every 14 days.   fluticasone 50 MCG/ACT nasal spray Commonly known as: FLONASE Place 1 spray into both nostrils daily.   iron polysaccharides 150 MG capsule Commonly known as: NIFEREX Take 1 capsule (150 mg total) by mouth daily.   metroNIDAZOLE 500 MG tablet Commonly known as: FLAGYL Take 1 tablet (500 mg total) by mouth 2 (two) times daily.   ondansetron 4 MG disintegrating tablet Commonly known as: ZOFRAN-ODT Take 1 tablet (4 mg total) by mouth every 6 (six) hours as needed for nausea or vomiting.         REVIEW OF SYSTEMS: Pertinent positives and negatives discussed in HPI.   Objective:   Physical Exam: BP (!) 128/100   Pulse 86   Temp 98.4 F (36.9 C) (Temporal)   Ht 5' 6.54" (1.69 m)   Wt 173 lb 11.2 oz (78.8 kg)   SpO2 98%   BMI 27.59 kg/m  Body mass index is 27.59 kg/m. GEN: alert, well developed HEENT: clear conjunctiva, nose with bilateral nasal polyps and significant mucoid drainage.  HEART: regular rate and rhythm, no murmur LUNGS: clear to auscultation bilaterally, no coughing, unlabored respiration ABDOMEN: soft, non distended  SKIN: no rashes or lesions  Reviewed:  03/20/2022: CT maxillofacial: Sinuses/Orbits: Bilateral maxillary, ethmoid, sphenoid, frontal mucosal thickening with almost complete opacification. Mastoid air cells are clear. Similar chronic deformity and hyperdensity of the left orbit. The right orbit are unremarkable.  05/07/2023: seen by Dr Pollyann Kennedy for nasal polyposis with great response on Dupixent. Had complete obstruction of nasal cavities with polypoid disease.  Referred to Allergy for Dupixent.  Can consider Dupixent in the future.   07/29/2021: saw Allergy and Asthma at [facility] for nasal polyps, asthma, aspirin allergy.  Childhood aspirin allergy, unsure of reaction.  Has used Goody powder, ibuprofen, Advil int he past without any issues.  Discussed she likely does not have an allergy to aspirin based on this. Also with asthm and nasal polyps.  Started on Dupxient.   AEC 04/2023 600  Spirometry:  Tracings reviewed. Her effort: Good reproducible efforts. FVC: 3.55L FEV1: 2.49L, 92% predicted FEV1/FVC ratio: 70% Interpretation: Spirometry consistent with normal pattern.  Please see scanned spirometry results for details.  Skin Testing:  Skin prick testing was placed, which includes aeroallergens/foods, histamine control, and saline control.  Verbal consent was obtained prior to placing test.  Patient tolerated procedure well.  Allergy testing results were read and interpreted by myself, documented by clinical staff. Adequate positive and negative control.  Results discussed with patient/family.  Airborne Adult Perc - 06/10/23 1103     Time Antigen  Placed 1103    Allergen Manufacturer Greer    Location Back    Number of Test 55    1. Control-Buffer 50% Glycerol Negative    2. Control-Histamine 3+    3. Bahia 2+    4. [place] 2+    5. Johnson Negative    6. Kentucky Blue 3+    7. Meadow Fescue 3+    8. Perennial Rye 3+    9. Timothy 3+    10. Ragweed Mix 2+    11. Cocklebur 2+    12. Plantain,  English 3+    13. Baccharis Negative    14. Dog Fennel 2+    15. Russian Thistle Negative    16. Lamb's Quarters 2+    17. Sheep Sorrell Negative    18. Rough Pigweed Negative    19. Marsh Elder, Rough 2+    20. Mugwort, Common 2+    21. Box, Elder 2+    22. Cedar, red Negative    23. Sweet Gum Negative    24. Pecan Pollen 2+    25. Pine Mix Negative    26. Walnut, Black Pollen Negative    27. Red Mulberry Negative    28. Ash Mix 3+    29. Birch Mix Negative    30. Beech American Negative    31. Cottonwood, Guinea-Bissau Negative    32. Hickory, White Negative    33. Maple Mix Negative    34. Oak,  Gu[place] Mix Negative    35. Sycamore Eastern Negative    36. Alternaria Alternata Negative    38. Aspergillus Mix Negative    39. Penicillium Mix Negative    40. Bipolaris Sorokiniana (Helminthosporium) Negative    41. Drechslera Spicifera (Curvularia) Negative    42. Mucor Plumbeus Negative    43. Fusarium Moniliforme Negative    44. Aureobasidium Pullulans (pullulara) Negative    45. Rhizopus Oryzae Negative    46. Botrytis Cinera Negative    47. Epicoccum Nigrum Negative    48. Phoma Betae Negative    49. Dust Mite Mix Negative    50. Cat Hair 10,000 BAU/ml Negative    51.  Dog Epithelia 3+    52. Mixed Feathers Negative    53. Horse Epithelia Negative    54. Cockroach, German Negative    55. Tobacco Leaf Negative               Assessment:   1. Chronic pansinusitis   2. Multiple nasal polyps   3. Moderate persistent asthma without complication   4. Non-seasonal allergic rhinitis due to pollen   5. Allergic rhinitis due to animal hair or dander     Plan/Recommendations:  Allergic Rhinitis: Chronic Sinusitis with Nasal Polyps - Due to turbinate hypertrophy, severe nasal polyps and unresponsive to over the counter meds, performed skin testing to identify aeroallergen triggers.   - Positive skin test 05/2023: trees, grasses, weeds, dogs  - Avoidance measures discussed. - Use nasal saline rinses before nose sprays such as with Neilmed Sinus Rinse.  Use distilled water.   - Use Flonase 2 sprays each nostril daily. Aim upward and outward. - Use Azelastine 2 sprays each nostril twice daily. Aim upward and outward. - Use Zyrtec 10 mg daily.  - Consider allergy shots as long term control of your symptoms by teaching your immune system to be more tolerant of your allergy triggers - Recommend starting Dupixent 300mg  every other week due to severe nasal polyps and uncontrolled  asthma.  I will message Tammy regarding this.    Moderate Persistent Asthma: - MDI technique  discussed.  Spirometry today was normal. Frequent symptoms of dyspnea which could also be due to uncontrolled upper airway with polyps.  - Maintenance inhaler: start Symbicort 160-4.21mcg 2 puffs twice daily with spacer.  - Rescue inhaler: Albuterol 2 puffs via spacer or 1 vial via nebulizer every 4-6 hours as needed for respiratory symptoms of cough, shortness of breath, or wheezing Asthma control goals:  Full participation in all desired activities (may need albuterol before activity) Albuterol use two times or less a week on average (not counting use with activity) Cough interfering with sleep two times or less a month Oral steroids no more than once a year No hospitalizations    Return in about 6 weeks (around 07/22/2023).  Alesia Morin, MD [facility]

## 2023-06-11 ENCOUNTER — Telehealth: Payer: Self-pay | Admitting: *Deleted

## 2023-06-11 NOTE — Telephone Encounter (Signed)
-----   Message from Birder Robson sent at 06/10/2023 12:00 PM EDT ----- Susan Walton this patient has severe nasal polyps, complete opacification of all sinuses on CT 2023.  Seen by ENT recently, they can't see much due to polyps. Even on my exam I couldn't see anything due to large polyps.  Never had prior surgery. Would like to start Dupixent.

## 2023-06-11 NOTE — Telephone Encounter (Signed)
L/m for patient to contact me to advise approval and submit to [facility] for Dupixent

## 2023-06-16 ENCOUNTER — Other Ambulatory Visit (HOSPITAL_COMMUNITY): Payer: Self-pay

## 2023-07-03 ENCOUNTER — Other Ambulatory Visit (HOSPITAL_COMMUNITY): Payer: Self-pay

## 2023-07-03 ENCOUNTER — Other Ambulatory Visit: Payer: Self-pay

## 2023-07-03 MED ORDER — DUPIXENT 300 MG/2ML ~~LOC~~ SOSY
300.0000 mg | PREFILLED_SYRINGE | SUBCUTANEOUS | 11 refills | Status: DC
  Filled 2023-07-09 – 2023-07-22 (×2): qty 4, 28d supply, fill #0
  Filled 2023-08-25: qty 4, 28d supply, fill #1
  Filled 2023-10-21 (×2): qty 4, 28d supply, fill #2
  Filled 2024-02-29: qty 4, 28d supply, fill #3
  Filled 2024-03-21 – 2024-06-08 (×2): qty 4, 28d supply, fill #4

## 2023-07-03 NOTE — Telephone Encounter (Signed)
Patient called and update phone number. Advised rx to [facility] and will reach out with delivery date to schedule initial injection in clinic then she can get in home.

## 2023-07-07 ENCOUNTER — Other Ambulatory Visit (HOSPITAL_COMMUNITY): Payer: Self-pay

## 2023-07-09 ENCOUNTER — Other Ambulatory Visit: Payer: Self-pay

## 2023-07-09 ENCOUNTER — Other Ambulatory Visit (HOSPITAL_COMMUNITY): Payer: Self-pay

## 2023-07-09 NOTE — Progress Notes (Signed)
Specialty Pharmacy Initiation Note   Susan Walton is a 42 y.o. female who will be followed by the specialty pharmacy service for RxSp Asthma/COPD    Review of administration, indication, effectiveness, safety, potential side effects, storage/disposable, and missed dose instructions occurred today for patient's specialty medication(s) Dupilumab  Patient had previously taken Dupixent from another provider but has been off of the medication for some time due to personal reasons.    Patient/Caregiver did not have any additional questions or concerns.   Patient's therapy is appropriate to: Initiate    Goals Addressed             This Visit's Progress    Reduce signs and symptoms       Patient is initiating therapy. Patient will maintain adherence          Otto Herb Specialty Pharmacist

## 2023-07-15 ENCOUNTER — Other Ambulatory Visit: Payer: Self-pay

## 2023-07-15 NOTE — Progress Notes (Signed)
Specialty Pharmacy Initial Fill Coordination Note  Susan Walton is a 42 y.o. female contacted today regarding refills of specialty medication(s) No data recorded  Patient requested No data recorded  Delivery date: 07/21/23   Verified address: No data recorded  Medication will be filled on 11/04.   Patient is aware of $4.00 copayment.

## 2023-07-20 ENCOUNTER — Other Ambulatory Visit: Payer: Self-pay

## 2023-07-20 ENCOUNTER — Emergency Department (HOSPITAL_COMMUNITY)
Admission: EM | Admit: 2023-07-20 | Discharge: 2023-07-20 | Disposition: A | Discharge status: Home / Self Care | Payer: MEDICAID | Attending: Emergency Medicine | Admitting: Emergency Medicine

## 2023-07-20 ENCOUNTER — Emergency Department (HOSPITAL_COMMUNITY): Payer: MEDICAID

## 2023-07-20 ENCOUNTER — Encounter (HOSPITAL_COMMUNITY): Payer: Self-pay | Admitting: Emergency Medicine

## 2023-07-20 DIAGNOSIS — R0789 Other chest pain: Secondary | ICD-10-CM | POA: Diagnosis present

## 2023-07-20 DIAGNOSIS — R0602 Shortness of breath: Secondary | ICD-10-CM | POA: Diagnosis not present

## 2023-07-20 DIAGNOSIS — F419 Anxiety disorder, unspecified: Secondary | ICD-10-CM | POA: Diagnosis not present

## 2023-07-20 LAB — CBC
HCT: 34.5 % — ABNORMAL LOW (ref 36.0–46.0)
Hemoglobin: 10.2 g/dL — ABNORMAL LOW (ref 12.0–15.0)
MCH: 21.3 pg — ABNORMAL LOW (ref 26.0–34.0)
MCHC: 29.6 g/dL — ABNORMAL LOW (ref 30.0–36.0)
MCV: 72 fL — ABNORMAL LOW (ref 80.0–100.0)
Platelets: 500 10*3/uL — ABNORMAL HIGH (ref 150–400)
RBC: 4.79 MIL/uL (ref 3.87–5.11)
RDW: 19 % — ABNORMAL HIGH (ref 11.5–15.5)
WBC: 7.3 10*3/uL (ref 4.0–10.5)
nRBC: 0 % (ref 0.0–0.2)

## 2023-07-20 LAB — HCG, SERUM, QUALITATIVE: Preg, Serum: NEGATIVE

## 2023-07-20 LAB — BASIC METABOLIC PANEL
Anion gap: 11 (ref 5–15)
BUN: 11 mg/dL (ref 6–20)
CO2: 24 mmol/L (ref 22–32)
Calcium: 9 mg/dL (ref 8.9–10.3)
Chloride: 101 mmol/L (ref 98–111)
Creatinine, Ser: 0.68 mg/dL (ref 0.44–1.00)
GFR, Estimated: >60 mL/min (ref >60)
Glucose, Bld: 127 mg/dL — ABNORMAL HIGH (ref 70–99)
Potassium: 3.3 mmol/L — ABNORMAL LOW (ref 3.5–5.1)
Sodium: 136 mmol/L (ref 135–145)

## 2023-07-20 LAB — TROPONIN I (HIGH SENSITIVITY): Troponin I (High Sensitivity): 4 ng/L (ref <18)

## 2023-07-20 MED ORDER — LORAZEPAM 0.5 MG PO TABS
0.5000 mg | ORAL_TABLET | Freq: Once | ORAL | Status: AC
  Administered 2023-07-20: 0.5 mg via ORAL
  Filled 2023-07-20: qty 1

## 2023-07-20 NOTE — ED Provider Notes (Signed)
[place] EMERGENCY DEPARTMENT AT [facility] Provider Note   CSN: [id number] Arrival date & time: 07/20/23  1045     History  Chief Complaint  Patient presents with   Chest Pain   Shortness of Breath    Tessia Kassin is a 42 y.o. female.  42 year old female with prior medical history as detailed below presents for evaluation.  She admits to multiple stressors at home including having teenagers live with her.  She reports a new job.  She reports that her rent is due.  She is not sure that she has enough funds to pay her rent.  She is very anxious about all of these issues.  Earlier this morning she began to have vague chest discomfort.  She feels like this may be a panic attack.  She reports prior history of panic attacks.  She did not take anything specifically for stress or anxiety this morning.  Patient is visibly anxious and tearful during evaluation.  She denies recent use of alcohol, cocaine, or other drugs.  The history is provided by the patient and medical records.       Home Medications Prior to Admission medications   Medication Sig Start Date End Date Taking? Authorizing Provider  acetaminophen (TYLENOL) 500 MG tablet Take 1,000 mg by mouth every 4 (four) hours as needed for moderate pain, mild pain or headache. Patient not taking: Reported on 06/10/2023    [provider]  albuterol (VENTOLIN HFA) 108 (90 Base) MCG/ACT inhaler Inhale 2 puffs into the lungs every 6 (six) hours as needed. 06/10/23   Birder Robson, MD  amLODipine (NORVASC) 5 MG tablet Take 1 tablet (5 mg total) by mouth daily. 05/12/22   Carlisle Beers, FNP  azelastine (ASTELIN) 0.1 % nasal spray Place 1 spray into both nostrils 2 (two) times daily as needed. Use in each nostril as directed 06/10/23   Birder Robson, MD  Blood Pressure Monitoring (BLOOD PRESSURE CUFF) MISC Take your blood pressure 3-4 times weekly and write them down. Patient not taking: Reported on 06/10/2023  05/12/22   Carlisle Beers, FNP  budesonide-formoterol [facility]) 160-4.5 MCG/ACT inhaler Inhale 2 puffs into the lungs in the morning and at bedtime. 06/10/23   Birder Robson, MD  calcium carbonate (TUMS EX) 750 MG chewable tablet Chew 750-2,250 tablets (562,500-1,687,500 mg total) by mouth daily as needed for heartburn. Patient not taking: Reported on 06/10/2023 08/12/21   Rodolph Bong, MD  cetirizine (ZYRTEC ALLERGY) 10 MG tablet Take 1 tablet (10 mg total) by mouth daily. 06/10/23   Birder Robson, MD  dicyclomine (BENTYL) 10 MG capsule Take 1 capsule (10 mg total) by mouth 3 (three) times daily as needed for spasms. Patient not taking: Reported on 06/10/2023 02/15/23   Rising, Lurena Joiner, PA-C  dupilumab (DUPIXENT) 300 MG/2ML prefilled syringe Inject 300 mg into the skin every 14 (fourteen) days. 07/03/23   Birder Robson, MD  fluticasone (FLONASE) 50 MCG/ACT nasal spray Place 2 sprays into both nostrils daily. 06/10/23   Birder Robson, MD  iron polysaccharides (NIFEREX) 150 MG capsule Take 1 capsule (150 mg total) by mouth daily. 08/12/21   Rodolph Bong, MD  metroNIDAZOLE (FLAGYL) 500 MG tablet Take 1 tablet (500 mg total) by mouth 2 (two) times daily. Patient not taking: Reported on 06/10/2023 02/19/23   Merrilee Jansky, MD  ondansetron (ZOFRAN-ODT) 4 MG disintegrating tablet Take 1 tablet (4 mg total) by mouth every 6 (six) hours as  needed for nausea or vomiting. 02/15/23   Rising, Lurena Joiner, PA-C  labetalol (NORMODYNE) 100 MG tablet Take 1 tablet (100 mg total) by mouth 2 (two) times daily. 05/24/19 11/13/20  Wallis Bamberg, PA-C  NIFEdipine (PROCARDIA-XL/ADALAT CC) 60 MG 24 hr tablet Take 1 tablet (60 mg total) by mouth 2 (two) times daily. Patient not taking: Reported on 03/28/2019 12/13/17 05/24/19  Tereso Newcomer, MD  omeprazole (PRILOSEC) 40 MG capsule Take 1 capsule (40 mg total) by mouth daily. 30 minutes before breakfast Patient not taking: Reported on 03/28/2019 05/12/18 05/24/19   Napoleon Form, MD      Allergies    Other, Aspirin, and Naproxen    Review of Systems   Review of Systems  All other systems reviewed and are negative.   Physical Exam Updated Vital Signs BP (!) 158/107   Pulse (!) 115   Temp 98.3 F (36.8 C) (Oral)   Resp 18   Ht 5\' 7"  (1.702 m)   Wt 77.1 kg   LMP 07/16/2023   SpO2 97%   BMI 26.63 kg/m  Physical Exam Vitals and nursing note reviewed.  Constitutional:      General: She is not in acute distress.    Appearance: Normal appearance. She is well-developed.  HENT:     Head: Normocephalic and atraumatic.  Eyes:     Conjunctiva/sclera: Conjunctivae normal.     Pupils: Pupils are equal, round, and reactive to light.  Cardiovascular:     Rate and Rhythm: Normal rate and regular rhythm.     Heart sounds: Normal heart sounds.  Pulmonary:     Effort: Pulmonary effort is normal. No respiratory distress.     Breath sounds: Normal breath sounds.  Abdominal:     General: There is no distension.     Palpations: Abdomen is soft.     Tenderness: There is no abdominal tenderness.  Musculoskeletal:        General: No deformity. Normal range of motion.     Cervical back: Normal range of motion and neck supple.  Skin:    General: Skin is warm and dry.  Neurological:     General: No focal deficit present.     Mental Status: She is alert and oriented to person, place, and time.     ED Results / Procedures / Treatments   Labs (all labs ordered are listed, but only abnormal results are displayed) Labs Reviewed  CBC - Abnormal; Notable for the following components:      Result Value   Hemoglobin 10.2 (*)    HCT 34.5 (*)    MCV 72.0 (*)    MCH 21.3 (*)    MCHC 29.6 (*)    RDW 19.0 (*)    Platelets 500 (*)    All other components within normal limits  BASIC METABOLIC PANEL  HCG, SERUM, QUALITATIVE  RAPID URINE DRUG SCREEN, HOSP PERFORMED  TROPONIN I (HIGH SENSITIVITY)    EKG EKG Interpretation Date/Time:  Monday  July 20 2023 10:52:38 EST Ventricular Rate:  112 PR Interval:  131 QRS Duration:  86 QT Interval:  317 QTC Calculation: 433 R Axis:   71  Text Interpretation: Sinus tachycardia Right atrial enlargement Consider left ventricular hypertrophy Borderline T abnormalities, lateral leads Confirmed by Kristine Royal [phone]) on 07/20/2023 10:56:04 AM  Radiology No results found.  Procedures Procedures    Medications Ordered in ED Medications  LORazepam (ATIVAN) tablet 0.5 mg (has no administration in time range)    ED  Course/ Medical Decision Making/ A&P                                 Medical Decision Making Amount and/or Complexity of Data Reviewed Labs: ordered. Radiology: ordered.  Risk Prescription drug management.    Medical Screen Complete  This patient presented to the ED with complaint of atypical chest discomfort, anxiety.  This complaint involves an extensive number of treatment options. The initial differential diagnosis includes, but is not limited to, ACS, anxiety, metabolic abnormality, etc  This presentation is: Acute, Chronic, Self-Limited, Previously Undiagnosed, Uncertain Prognosis, Complicated, Systemic Symptoms, and Threat to Life/Bodily Function  Patient is presenting with highly atypical chest discomfort.  Symptoms are most likely related to anxiety.  Screening labs obtained are without significant abnormality.  Patient is feeling much improved and reassured after ED evaluation.  EKG is without acute ischemic change.  Troponin is 4.  She does understand need for close outpatient follow-up.  Strict return precautions given and understood.    Additional history obtained: External records from outside sources obtained and reviewed including prior ED visits and prior Inpatient records.    Lab Tests:  I ordered and personally interpreted labs.  The pertinent results include: CBC, BMP, troponin, ECG   Imaging Studies ordered:  I ordered  imaging studies including chest x-ray I independently visualized and interpreted obtained imaging which showed NAD I agree with the radiologist interpretation.   Cardiac Monitoring:  The patient was maintained on a cardiac monitor.  I personally viewed and interpreted the cardiac monitor which showed an underlying rhythm of: NSR   Medicines ordered:  I ordered medication including Ativan and for anxiety Reevaluation of the patient after these medicines showed that the patient: improved   Problem List / ED Course:  Anxiety, atypical chest pain   Reevaluation:  After the interventions noted above, I reevaluated the patient and found that they have: improved  Disposition:  After consideration of the diagnostic results and the patients response to treatment, I feel that the patent would benefit from close outpatient follow-up.          Final Clinical Impression(s) / ED Diagnoses Final diagnoses:  Atypical chest pain    Rx / DC Orders ED Discharge Orders     None         Wynetta Fines, MD 07/20/23 1501

## 2023-07-20 NOTE — Discharge Instructions (Signed)
Return for any problem.  ?

## 2023-07-20 NOTE — ED Triage Notes (Signed)
Patient arrives ambulatory by POV stating she has high blood pressure however has not been eating right. States she takes her BP medicine as prescribed. Reports having shortness of breath, dysphagia and chest pain onset of an hour ago. Reports using her inhaler this morning and made her symptoms worse.

## 2023-07-22 ENCOUNTER — Other Ambulatory Visit (HOSPITAL_COMMUNITY): Payer: Self-pay

## 2023-07-22 ENCOUNTER — Other Ambulatory Visit: Payer: Self-pay

## 2023-07-24 NOTE — Telephone Encounter (Signed)
Received Dupixent medication in office today. Called and left a voicemail asking for patient to return call to get her scheduled to start.

## 2023-07-27 ENCOUNTER — Ambulatory Visit: Payer: MEDICAID | Admitting: Internal Medicine

## 2023-07-28 ENCOUNTER — Other Ambulatory Visit (HOSPITAL_COMMUNITY): Payer: Self-pay

## 2023-07-29 ENCOUNTER — Ambulatory Visit: Payer: MEDICAID | Admitting: *Deleted

## 2023-07-29 DIAGNOSIS — J339 Nasal polyp, unspecified: Secondary | ICD-10-CM

## 2023-07-29 MED ORDER — DUPILUMAB 300 MG/2ML ~~LOC~~ SOSY
300.0000 mg | PREFILLED_SYRINGE | SUBCUTANEOUS | Status: AC
  Administered 2023-07-29: 300 mg via SUBCUTANEOUS

## 2023-07-29 NOTE — Progress Notes (Signed)
Immunotherapy   Patient Details  Name: Susan Walton MRN: [id number] Date of Birth: 28-Dec-1980  07/29/2023  Aaron Edelman started injections for  Dupixent  Frequency: Every 14 days Epi-Pen: Not Required  Consent signed and patient instructions given. Patient started Dupixent today and self administered 300mg  in the right thigh. Patient waited in office for 15 minutes and did not experience any issues. She will continue to self administer every 2 weeks at home.   Susan Walton 07/29/2023, 2:46 PM

## 2023-08-10 ENCOUNTER — Other Ambulatory Visit (HOSPITAL_COMMUNITY): Payer: Self-pay

## 2023-08-14 ENCOUNTER — Other Ambulatory Visit (HOSPITAL_COMMUNITY): Payer: Self-pay

## 2023-08-17 ENCOUNTER — Other Ambulatory Visit (HOSPITAL_COMMUNITY): Payer: Self-pay

## 2023-08-21 ENCOUNTER — Other Ambulatory Visit (HOSPITAL_COMMUNITY): Payer: Self-pay

## 2023-08-21 ENCOUNTER — Encounter (HOSPITAL_COMMUNITY): Payer: Self-pay

## 2023-08-24 ENCOUNTER — Other Ambulatory Visit: Payer: Self-pay

## 2023-08-25 ENCOUNTER — Other Ambulatory Visit: Payer: Self-pay

## 2023-08-25 NOTE — Progress Notes (Signed)
Specialty Pharmacy Refill Coordination Note  Susan Walton is a 42 y.o. female contacted today regarding refills of specialty medication(s) Dupilumab   Patient requested Delivery   Delivery date: 08/27/23   Verified address: [address] [name], [id number]   Medication will be filled on 08/26/23.

## 2023-08-26 ENCOUNTER — Other Ambulatory Visit: Payer: Self-pay

## 2023-09-15 ENCOUNTER — Other Ambulatory Visit (HOSPITAL_COMMUNITY): Payer: Self-pay

## 2023-09-23 ENCOUNTER — Other Ambulatory Visit: Payer: Self-pay

## 2023-10-14 ENCOUNTER — Other Ambulatory Visit (HOSPITAL_COMMUNITY): Payer: Self-pay

## 2023-10-19 ENCOUNTER — Other Ambulatory Visit: Payer: Self-pay

## 2023-10-21 ENCOUNTER — Other Ambulatory Visit: Payer: Self-pay

## 2023-10-21 NOTE — Progress Notes (Signed)
 Specialty Pharmacy Refill Coordination Note  Susan Walton is a 43 y.o. female contacted today regarding refills of specialty medication(s) Dupilumab  (DUPIXENT )   Patient requested Delivery   Delivery date: 10/22/23   Verified address: [address]   [place] [id number]   Medication will be filled on 10/21/23.

## 2023-11-09 ENCOUNTER — Other Ambulatory Visit: Payer: Self-pay

## 2023-11-12 ENCOUNTER — Other Ambulatory Visit: Payer: Self-pay

## 2023-11-16 ENCOUNTER — Other Ambulatory Visit: Payer: Self-pay

## 2024-01-04 ENCOUNTER — Other Ambulatory Visit (HOSPITAL_COMMUNITY): Payer: Self-pay

## 2024-01-27 ENCOUNTER — Other Ambulatory Visit: Payer: Self-pay

## 2024-02-04 ENCOUNTER — Other Ambulatory Visit (HOSPITAL_COMMUNITY): Payer: Self-pay

## 2024-02-05 ENCOUNTER — Other Ambulatory Visit (HOSPITAL_COMMUNITY): Payer: Self-pay

## 2024-02-10 ENCOUNTER — Other Ambulatory Visit: Payer: Self-pay

## 2024-02-10 NOTE — Progress Notes (Signed)
 Disenrolling - call center unable to reach patient. Dupixent  last filled 2.5.25 (112 days ago).

## 2024-02-29 ENCOUNTER — Other Ambulatory Visit: Payer: Self-pay

## 2024-02-29 ENCOUNTER — Other Ambulatory Visit (HOSPITAL_COMMUNITY): Payer: Self-pay

## 2024-02-29 NOTE — Progress Notes (Signed)
 Specialty Pharmacy Initiation Note   Susan Walton is a 43 y.o. female who will be followed by the specialty pharmacy service for RxSp Allergy     Review of administration, indication, effectiveness, safety, potential side effects, storage/disposable, and missed dose instructions occurred today for patient's specialty medication(s) Dupilumab  (DUPIXENT )     Patient/Caregiver did not have any additional questions or concerns.   Patient's therapy is appropriate to: Initiate    Goals Addressed             This Visit's Progress    Reduce signs and symptoms       Patient is re-initiating therapy. She was maintained on therapy until February with good results. She discontinued at that time with provider approval due to symptom control. Symptoms have now returned and provider wishes for her to restart. Patient will maintain adherence          Rena Carnes Specialty Pharmacist

## 2024-02-29 NOTE — Progress Notes (Signed)
 Per Tammy- no loading dose needed.

## 2024-02-29 NOTE — Progress Notes (Signed)
 Specialty Pharmacy Initial Fill Coordination Note  Susan Walton is a 43 y.o. female contacted today regarding initial fill of specialty medication(s) Dupilumab  (DUPIXENT )   Patient requested [name] at [facility] [place] date: 02/29/24   Medication will be filled on 02/29/24.   Patient is aware of $4 copayment.

## 2024-03-01 ENCOUNTER — Other Ambulatory Visit (HOSPITAL_COMMUNITY): Payer: Self-pay

## 2024-03-01 ENCOUNTER — Other Ambulatory Visit: Payer: Self-pay

## 2024-03-17 ENCOUNTER — Other Ambulatory Visit: Payer: Self-pay

## 2024-03-21 ENCOUNTER — Other Ambulatory Visit: Payer: Self-pay

## 2024-03-23 ENCOUNTER — Other Ambulatory Visit: Payer: Self-pay

## 2024-04-25 ENCOUNTER — Telehealth: Payer: Self-pay | Admitting: Internal Medicine

## 2024-04-25 NOTE — Telephone Encounter (Signed)
 Called to schedule Dupixent  reappoval. Line was not accepting calls at this time, could not leave message.

## 2024-06-08 ENCOUNTER — Other Ambulatory Visit (HOSPITAL_COMMUNITY): Payer: Self-pay

## 2024-06-08 ENCOUNTER — Other Ambulatory Visit: Payer: Self-pay

## 2024-06-08 ENCOUNTER — Other Ambulatory Visit: Payer: Self-pay | Admitting: Pharmacy Technician

## 2024-06-08 NOTE — Progress Notes (Signed)
 Specialty Pharmacy Refill Coordination Note  Susan Walton is a 43 y.o. female contacted today regarding refills of specialty medication(s) Dupilumab  (DUPIXENT )   Patient requested [place] at [facility] at [place] date: 06/09/24   Medication will be filled on 06/08/24 or 06/09/24.  Patient reported that injection date is the date when she picks up medication.

## 2024-06-09 ENCOUNTER — Other Ambulatory Visit: Payer: Self-pay

## 2024-06-09 ENCOUNTER — Other Ambulatory Visit (HOSPITAL_COMMUNITY): Payer: Self-pay

## 2024-06-30 ENCOUNTER — Other Ambulatory Visit: Payer: Self-pay

## 2024-06-30 ENCOUNTER — Ambulatory Visit (HOSPITAL_COMMUNITY)
Admission: EM | Admit: 2024-06-30 | Discharge: 2024-06-30 | Disposition: A | Discharge status: Home / Self Care | Payer: MEDICAID | Attending: Emergency Medicine | Admitting: Emergency Medicine

## 2024-06-30 ENCOUNTER — Encounter (HOSPITAL_COMMUNITY): Payer: Self-pay | Admitting: *Deleted

## 2024-06-30 DIAGNOSIS — R300 Dysuria: Secondary | ICD-10-CM | POA: Insufficient documentation

## 2024-06-30 DIAGNOSIS — N898 Other specified noninflammatory disorders of vagina: Secondary | ICD-10-CM | POA: Diagnosis present

## 2024-06-30 DIAGNOSIS — R1030 Lower abdominal pain, unspecified: Secondary | ICD-10-CM | POA: Diagnosis present

## 2024-06-30 DIAGNOSIS — Z113 Encounter for screening for infections with a predominantly sexual mode of transmission: Secondary | ICD-10-CM | POA: Diagnosis present

## 2024-06-30 DIAGNOSIS — I10 Essential (primary) hypertension: Secondary | ICD-10-CM | POA: Diagnosis present

## 2024-06-30 LAB — POCT URINALYSIS DIP (MANUAL ENTRY)
Bilirubin, UA: NEGATIVE
Blood, UA: NEGATIVE
Glucose, UA: NEGATIVE mg/dL
Ketones, POC UA: NEGATIVE mg/dL
Nitrite, UA: NEGATIVE
Protein Ur, POC: NEGATIVE mg/dL
Spec Grav, UA: 1.02 (ref 1.010–1.025)
Urobilinogen, UA: 0.2 U/dL
pH, UA: 6.5 (ref 5.0–8.0)

## 2024-06-30 LAB — POCT URINE PREGNANCY: Preg Test, Ur: NEGATIVE

## 2024-06-30 LAB — HIV ANTIBODY (ROUTINE TESTING W REFLEX): HIV Screen 4th Generation wRfx: NONREACTIVE

## 2024-06-30 MED ORDER — AMLODIPINE BESYLATE 5 MG PO TABS: 5.0000 mg | ORAL_TABLET | Freq: Every day | ORAL | 1 refills | Status: AC

## 2024-06-30 NOTE — ED Triage Notes (Signed)
 PT reports vag discharge ,itching with odor for 2 weeks. Pt also has ABD pain.

## 2024-06-30 NOTE — Discharge Instructions (Addendum)
 Your urinalysis does a small amount of bacteria which could be contamination from the vaginal discharge.  We will send a urine culture of this to confirm if there is any presence of urinary tract infection. Your urine culture, swab results, and blood work results will return over the next few days and someone will call if results are positive or require any treatment. I have prescribed your amlodipine  that was previously prescribed for your blood pressure that you can start taking once daily to help manage this. Follow-up with your primary care provider for further management of your blood pressure. Return here as needed.

## 2024-06-30 NOTE — ED Provider Notes (Signed)
 [facility]    CSN: [id number] Arrival date & time: 06/30/24  1616      History   Chief Complaint Chief Complaint  Patient presents with   Vaginal Itching   Vaginal Discharge   Abdominal Pain    HPI Susan Walton is a 43 y.o. female.   Patient presents with vaginal discharge, vaginal itching, and vaginal irritation for 2 weeks.  Patient states that has also had some lower abdominal pain, dysuria, and and urinary frequency over the last 2 weeks.  Patient denies any abnormal vaginal bleeding or vaginal lesions.  LMP 9/16.  Patient reports that she is sexually active and is requesting STD testing.  Patient denies any known exposures to STDs.  Patient denies flank pain, nausea, vomiting, hematuria, and fever.  Patient blood pressure is elevated in clinic today.  Patient states that she does have a history of high blood pressure but has not been on blood pressure medication for a long time.  Patient states that she was previously prescribed amlodipine  for her blood pressure.  Patient reports that she does have a primary care provider, but has not seen them in a while.  The history is provided by the patient and medical records.  Vaginal Itching Associated symptoms include abdominal pain.  Vaginal Discharge Associated symptoms: abdominal pain and vaginal itching   Abdominal Pain Associated symptoms: vaginal discharge     Past Medical History:  Diagnosis Date   Alcoholism (HCC)    no alcohol x 1 month per patient 06/2021   Anemia    Anxiety    Asthma attack 01/19/2021   Blind left eye    Depression    Fibroid    Helicobacter pylori gastritis    Hypertension    Retinal detachment     Patient Active Problem List   Diagnosis Date Noted   Nasal congestion 03/05/2023   History of hypertension 03/05/2023   Elevated blood pressure reading 03/05/2023   Essential hypertension 05/12/2022   Bronchiectasis (HCC) 08/12/2021   Aspiration pneumonia (HCC) 08/12/2021    Dyspnea 08/11/2021   Nausea and vomiting 08/11/2021   Acute respiratory failure with hypoxemia (HCC) 08/11/2021   Depression 06/27/2021   MDD (major depressive disorder), recurrent severe, 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   Schizoaffective disorder (HCC) 06/10/2021   PTSD (post-traumatic stress disorder) 06/10/2021   GAD (generalized anxiety disorder) 06/10/2021   Acute respiratory failure with hypoxia (HCC) 01/20/2021   Asthma attack 01/19/2021   Asthma 01/19/2021   Group B Streptococcus carrier, +RV culture, currently pregnant 12/10/2017   Gonorrhea affecting pregnancy in first trimester 12/10/2017   Depression affecting pregnancy in third trimester, antepartum 10/20/2017   Pregnancy, supervision, high-risk 07/27/2017   Chronic hypertension with superimposed severe preeclampsia 07/27/2017   Drug abuse during pregnancy (HCC) 07/27/2017   Thyroid dysfunction, antepartum 07/27/2017   History of trichomoniasis 07/27/2017   Unwanted fertility 07/27/2017   Advanced maternal age in multigravida, second trimester    Adjustment disorder with depressed mood 06/05/2017    Past Surgical History:  Procedure Laterality Date   DILATION AND CURETTAGE OF UTERUS     EYE SURGERY Left    TUBAL LIGATION N/A 12/11/2017   Procedure: POST PARTUM TUBAL LIGATION;  Surgeon: Alger Gong, MD;  Location: [facility];  Service: Gynecology;  Laterality: N/A;    OB History     Gravida  8   Para  5   Term  5   Preterm  0   AB  3  Living  5      SAB  2   IAB  1   Ectopic  0   Multiple  0   Live Births  5            Home Medications    Prior to Admission medications   Medication Sig Start Date End Date Taking? Authorizing Provider  albuterol  (VENTOLIN  HFA) 108 (90 Base) MCG/ACT inhaler Inhale 2 puffs into the lungs every 6 (six) hours as needed. 06/10/23  Yes Tobie Arleta SQUIBB, MD  acetaminophen  (TYLENOL ) 500 MG tablet Take 1,000 mg by mouth every 4 (four) hours as needed  for moderate pain, mild pain or headache. Patient not taking: Reported on 06/10/2023    [provider]  amLODipine  (NORVASC ) 5 MG tablet Take 1 tablet (5 mg total) by mouth daily. 06/30/24   Johnie Flaming A, NP  azelastine  (ASTELIN ) 0.1 % nasal spray Place 1 spray into both nostrils 2 (two) times daily as needed. Use in each nostril as directed 06/10/23   Tobie Arleta SQUIBB, MD  Blood Pressure Monitoring (BLOOD PRESSURE CUFF) MISC Take your blood pressure 3-4 times weekly and write them down. Patient not taking: Reported on 06/10/2023 05/12/22   Enedelia Dorna HERO, FNP  budesonide -formoterol  (SYMBICORT ) 160-4.5 MCG/ACT inhaler Inhale 2 puffs into the lungs in the morning and at bedtime. 06/10/23   Tobie Arleta SQUIBB, MD  calcium  carbonate (TUMS EX) 750 MG chewable tablet Chew 750-2,250 tablets (562,500-1,687,500 mg total) by mouth daily as needed for heartburn. Patient not taking: Reported on 06/10/2023 08/12/21   Sebastian Toribio GAILS, MD  cetirizine  (ZYRTEC  ALLERGY ) 10 MG tablet Take 1 tablet (10 mg total) by mouth daily. 06/10/23   Tobie Arleta SQUIBB, MD  dicyclomine  (BENTYL ) 10 MG capsule Take 1 capsule (10 mg total) by mouth 3 (three) times daily as needed for spasms. Patient not taking: Reported on 06/10/2023 02/15/23   Rising, Asberry, PA-C  dupilumab  (DUPIXENT ) 300 MG/2ML prefilled syringe Inject 300 mg into the skin every 14 (fourteen) days. 07/03/23   Tobie Arleta SQUIBB, MD  fluticasone  (FLONASE ) 50 MCG/ACT nasal spray Place 2 sprays into both nostrils daily. 06/10/23   Tobie Arleta SQUIBB, MD  iron  polysaccharides (NIFEREX) 150 MG capsule Take 1 capsule (150 mg total) by mouth daily. 08/12/21   Sebastian Toribio GAILS, MD  metroNIDAZOLE  (FLAGYL ) 500 MG tablet Take 1 tablet (500 mg total) by mouth 2 (two) times daily. Patient not taking: Reported on 06/10/2023 02/19/23   Blaise Aleene KIDD, MD  labetalol  (NORMODYNE ) 100 MG tablet Take 1 tablet (100 mg total) by mouth 2 (two) times daily. 05/24/19 11/13/20  Christopher Savannah,  PA-C  NIFEdipine  (PROCARDIA -XL/ADALAT  CC) 60 MG 24 hr tablet Take 1 tablet (60 mg total) by mouth 2 (two) times daily. Patient not taking: Reported on 03/28/2019 12/13/17 05/24/19  Anyanwu, Ugonna A, MD  omeprazole  (PRILOSEC) 40 MG capsule Take 1 capsule (40 mg total) by mouth daily. 30 minutes before breakfast Patient not taking: Reported on 03/28/2019 05/12/18 05/24/19  Nandigam, Kavitha V, MD    Family History Family History  Problem Relation Age of Onset   Hypertension Mother    Allergic rhinitis Father    Hypertension Father    COPD Maternal Grandfather    Liver disease Paternal Grandmother    Liver disease Paternal Grandfather     Social History Social History   Tobacco Use   Smoking status: Former    Current packs/day: 0.50    Types: Cigarettes  Passive exposure: Never   Smokeless tobacco: Never  Vaping Use   Vaping status: Never Used  Substance Use Topics   Alcohol use: Yes    Comment: 4 per day   Drug use: Yes    Types: Marijuana     Allergies   Other, Aspirin , and Naproxen    Review of Systems Review of Systems  Gastrointestinal:  Positive for abdominal pain.  Genitourinary:  Positive for vaginal discharge.   Per HPI  Physical Exam Triage Vital Signs ED Triage Vitals  Encounter Vitals Group     BP 06/30/24 1757 (!) 180/106     Girls Systolic BP Percentile --      Girls Diastolic BP Percentile --      Boys Systolic BP Percentile --      Boys Diastolic BP Percentile --      Pulse Rate 06/30/24 1757 71     Resp 06/30/24 1757 20     Temp 06/30/24 1757 97.8 F (36.6 C)     Temp src --      SpO2 06/30/24 1757 (!) 71 %     Weight --      Height --      Head Circumference --      Peak Flow --      Pain Score 06/30/24 1755 4     Pain Loc --      Pain Education --      Exclude from Growth Chart --    No data found.  Updated Vital Signs BP (!) 180/106   Pulse 71   Temp 97.8 F (36.6 C)   Resp 20   LMP 05/31/2024   SpO2 97%   Visual  Acuity Right Eye Distance:   Left Eye Distance:   Bilateral Distance:    Right Eye Near:   Left Eye Near:    Bilateral Near:     Physical Exam Vitals and nursing note reviewed.  Constitutional:      General: She is awake. She is not in acute distress.    Appearance: Normal appearance. She is well-developed and well-groomed. She is not ill-appearing.  Cardiovascular:     Rate and Rhythm: Normal rate and regular rhythm.  Pulmonary:     Effort: Pulmonary effort is normal.     Breath sounds: Normal breath sounds.  Abdominal:     General: Abdomen is flat. Bowel sounds are normal. There is no distension.     Palpations: Abdomen is soft. There is no mass.     Tenderness: There is abdominal tenderness in the suprapubic area. There is no right CVA tenderness, left CVA tenderness, guarding or rebound.     Hernia: No hernia is present.  Genitourinary:    Comments: Exam deferred Skin:    General: Skin is warm and dry.  Neurological:     Mental Status: She is alert.  Psychiatric:        Behavior: Behavior is cooperative.      UC Treatments / Results  Labs (all labs ordered are listed, but only abnormal results are displayed) Labs Reviewed  POCT URINALYSIS DIP (MANUAL ENTRY) - Abnormal; Notable for the following components:      Result Value   Color, UA orange (*)    Clarity, UA cloudy (*)    Leukocytes, UA Trace (*)    All other components within normal limits  URINE CULTURE  HIV ANTIBODY (ROUTINE TESTING W REFLEX)  RPR  POCT URINE PREGNANCY  CERVICOVAGINAL ANCILLARY ONLY  EKG   Radiology No results found.  Procedures Procedures (including critical care time)  Medications Ordered in UC Medications - No data to display  Initial Impression / Assessment and Plan / UC Course  I have reviewed the triage vital signs and the nursing notes.  Pertinent labs & imaging results that were available during my care of the patient were reviewed by me and considered in my  medical decision making (see chart for details).     Patient is overall well-appearing.  Vitals are stable.  Blood pressure is elevated at 180/106.  Mild tenderness noted to suprapubic region.  GU exam deferred.  Patient perform self swab for STD/STI.  HIV and RPR ordered  Urinalysis revealed trace leukocytes which is likely contamination from vaginal discharge.  Will send urine culture to rule out presence of urinary tract infection.  Refilled patient's amlodipine  for her blood pressure, and given 1 additional refill of this.  Discussed follow-up and return precautions. Final Clinical Impressions(s) / UC Diagnoses   Final diagnoses:  Dysuria  Vaginal discharge  Screen for STD (sexually transmitted disease)  Lower abdominal pain  Elevated blood pressure reading in office with diagnosis of hypertension     Discharge Instructions      Your urinalysis does a small amount of bacteria which could be contamination from the vaginal discharge.  We will send a urine culture of this to confirm if there is any presence of urinary tract infection. Your urine culture, swab results, and blood work results will return over the next few days and someone will call if results are positive or require any treatment. I have prescribed your amlodipine  that was previously prescribed for your blood pressure that you can start taking once daily to help manage this. Follow-up with your primary care provider for further management of your blood pressure. Return here as needed.     ED Prescriptions     Medication Sig Dispense Auth. Provider   amLODipine  (NORVASC ) 5 MG tablet Take 1 tablet (5 mg total) by mouth daily. 30 tablet Johnie Flaming A, NP      PDMP not reviewed this encounter.   Johnie Flaming A, NP 06/30/24 [phone]

## 2024-07-01 ENCOUNTER — Other Ambulatory Visit: Payer: Self-pay

## 2024-07-01 ENCOUNTER — Ambulatory Visit (HOSPITAL_COMMUNITY): Payer: Self-pay

## 2024-07-01 LAB — CERVICOVAGINAL ANCILLARY ONLY
Bacterial Vaginitis (gardnerella): POSITIVE — AB
Candida Glabrata: NEGATIVE
Candida Vaginitis: POSITIVE — AB
Chlamydia: NEGATIVE
Comment: NEGATIVE
Comment: NEGATIVE
Comment: NEGATIVE
Comment: NEGATIVE
Comment: NEGATIVE
Comment: NORMAL
Neisseria Gonorrhea: NEGATIVE
Trichomonas: POSITIVE — AB

## 2024-07-01 LAB — URINE CULTURE: Culture: <10000 — AB

## 2024-07-01 LAB — RPR: RPR Ser Ql: NONREACTIVE

## 2024-07-01 MED ORDER — FLUCONAZOLE 150 MG PO TABS: 150.0000 mg | ORAL_TABLET | Freq: Once | ORAL | 0 refills | Status: AC

## 2024-07-01 MED ORDER — METRONIDAZOLE 500 MG PO TABS: 500.0000 mg | ORAL_TABLET | Freq: Two times a day (BID) | ORAL | 0 refills | Status: AC

## 2024-07-05 ENCOUNTER — Other Ambulatory Visit: Payer: Self-pay | Admitting: Internal Medicine

## 2024-07-05 ENCOUNTER — Other Ambulatory Visit: Payer: Self-pay

## 2024-07-05 MED ORDER — DUPIXENT 300 MG/2ML ~~LOC~~ SOSY
300.0000 mg | PREFILLED_SYRINGE | SUBCUTANEOUS | 11 refills | Status: AC
  Filled 2024-07-05 – 2024-09-30 (×9): qty 4, 28d supply, fill #0

## 2024-07-07 ENCOUNTER — Other Ambulatory Visit (HOSPITAL_COMMUNITY): Payer: Self-pay

## 2024-07-07 ENCOUNTER — Other Ambulatory Visit: Payer: Self-pay

## 2024-07-07 NOTE — Progress Notes (Signed)
 Benefits Investigation Started  Reason: Prior Authorization Required  Routed to: Rx Prior Auth Team (ATTN: Juliana)

## 2024-07-08 ENCOUNTER — Other Ambulatory Visit (HOSPITAL_COMMUNITY): Payer: Self-pay

## 2024-07-15 ENCOUNTER — Other Ambulatory Visit: Payer: Self-pay

## 2024-07-18 ENCOUNTER — Other Ambulatory Visit (HOSPITAL_COMMUNITY): Payer: Self-pay

## 2024-07-20 ENCOUNTER — Other Ambulatory Visit (HOSPITAL_COMMUNITY): Payer: Self-pay

## 2024-08-02 ENCOUNTER — Other Ambulatory Visit: Payer: Self-pay

## 2024-08-03 ENCOUNTER — Ambulatory Visit (INDEPENDENT_AMBULATORY_CARE_PROVIDER_SITE_OTHER): Payer: MEDICAID | Admitting: Internal Medicine

## 2024-08-03 ENCOUNTER — Other Ambulatory Visit: Payer: Self-pay

## 2024-08-03 ENCOUNTER — Encounter: Payer: Self-pay | Admitting: Internal Medicine

## 2024-08-03 VITALS — BP 140/100 | HR 75 | Temp 97.6°F | Ht 67.5 in | Wt 167.7 lb

## 2024-08-03 DIAGNOSIS — J324 Chronic pansinusitis: Secondary | ICD-10-CM

## 2024-08-03 DIAGNOSIS — J302 Other seasonal allergic rhinitis: Secondary | ICD-10-CM

## 2024-08-03 DIAGNOSIS — J3089 Other allergic rhinitis: Secondary | ICD-10-CM | POA: Diagnosis not present

## 2024-08-03 DIAGNOSIS — J4540 Moderate persistent asthma, uncomplicated: Secondary | ICD-10-CM | POA: Diagnosis not present

## 2024-08-03 DIAGNOSIS — R0683 Snoring: Secondary | ICD-10-CM

## 2024-08-03 DIAGNOSIS — J339 Nasal polyp, unspecified: Secondary | ICD-10-CM

## 2024-08-03 MED ORDER — AZELASTINE-FLUTICASONE 137-50 MCG/ACT NA SUSP: 2.0000 | Freq: Two times a day (BID) | NASAL | 5 refills | Status: AC

## 2024-08-03 MED ORDER — CETIRIZINE HCL 10 MG PO TABS: 10.0000 mg | ORAL_TABLET | Freq: Every day | ORAL | 5 refills | Status: AC

## 2024-08-03 MED ORDER — BUDESONIDE-FORMOTEROL FUMARATE 160-4.5 MCG/ACT IN AERO: 2.0000 | INHALATION_SPRAY | Freq: Two times a day (BID) | RESPIRATORY_TRACT | 5 refills | Status: AC

## 2024-08-03 MED ORDER — ALBUTEROL SULFATE HFA 108 (90 BASE) MCG/ACT IN AERS: 2.0000 | INHALATION_SPRAY | Freq: Four times a day (QID) | RESPIRATORY_TRACT | 1 refills | Status: AC | PRN

## 2024-08-03 NOTE — Addendum Note (Signed)
 Addended by: Jayanna Kroeger on: 08/03/2024 03:59 PM   Modules accepted: Orders

## 2024-08-03 NOTE — Progress Notes (Signed)
 FOLLOW UP Date of Service/Encounter:  08/03/24   Subjective:  Susan Walton (DOB: [birth year]) is a 43 y.o. female who [facility] for follow up for asthma and allergic rhinitis.   History obtained from: chart review and patient. Last seen by me 06/10/2023 for CS with NP and allergic rhinitis, started on Flonase , Azelastine , Zyrtec  and Dupixent . Also with uncontrolled asthma, started on Symbicort .   Did not come for 6 week follow up.  Reports having trouble with frequent congestion, drainage, runny nose. She is having trouble breathing through her nose.  Also notes hx of sleep apnea with snoring and would like evaluation for CPAP therapy. On Dupixent , does forget to take her dose regularly.  Last dose was last night but is here for preapproval.  Not using any nose sprays or anti histamines. Has required oral prednisone  which clears her up but symptoms generally return after. Last seen ENT 10/3 and discussed surgical intervention if Dupixent  does not help.   Also notes frequent trouble with breathing and cough. Not using Symbicort .  No ER/urgent care/oral prednisone  for asthma.   Past Medical History: Past Medical History:  Diagnosis Date   Alcoholism (HCC)    no alcohol x 1 month per patient 06/2021   Anemia    Anxiety    Asthma attack 01/19/2021   Blind left eye    Depression    Fibroid    Helicobacter pylori gastritis    Hypertension    Retinal detachment     Objective:  BP (!) 140/100 (BP Location: Right Arm, Patient Position: Sitting)   Pulse 75   Temp 97.6 F (36.4 C) (Temporal)   Ht 5' 7.5 (1.715 m)   Wt 167 lb 11.2 oz (76.1 kg)   SpO2 99%   BMI 25.88 kg/m  Body mass index is 25.88 kg/m. Physical Exam: GEN: alert, well developed HEENT: clear conjunctiva, nose with severe inferior turbinate hypertrophy, mucoid drainage and polypoid edema bilaterally  HEART: regular rate and rhythm, no murmur LUNGS: clear to auscultation  bilaterally, no coughing, unlabored respiration SKIN: no rashes or lesions  Spirometry:  Tracings reviewed. Her effort: It was hard to get consistent efforts and there is a question as to whether this reflects a maximal maneuver. FVC: 1.83L, 55% predicted  FEV1: 1.62L, 60% predicted FEV1/FVC ratio: 89% Interpretation: Spirometry consistent with mixed obstructive and restrictive disease.  Please see scanned spirometry results for details.   Assessment:   1. Chronic pansinusitis   2. Multiple nasal polyps   3. Moderate persistent asthma without complication   4. Seasonal and perennial allergic rhinitis   5. Snoring     Plan/Recommendations:   Allergic Rhinitis: Chronic Sinusitis with Nasal Polyps   - Uncontrolled, restart nasal sprays and Dupixent .  - Positive skin test 05/2023: trees, grasses, weeds, dogs  - Use nasal saline rinses before nose sprays such as with Neilmed Sinus Rinse.  Use distilled water.   - Use Dysmita 2 sprays each nostril twice daily.  Aim upward and outward.  This replaces Flonase  and Azelastine .  - Use Zyrtec  10 mg daily.  - Consider allergy  shots as long term control of your symptoms by teaching your immune system to be more tolerant of your allergy  triggers - Use Dupixent  subcutaneous 300mg  every 14 days.    Moderate Persistent Asthma: - Uncontrolled, restart ICS/LABA.  Spirometry today with poor effort, likely both restriction and obstruction.  - Maintenance inhaler: restart Symbicort  160-4.56mcg 2 puffs twice daily  with spacer.  - Rescue inhaler: Albuterol  2 puffs via spacer or 1 vial via nebulizer every 4-6 hours as needed for respiratory symptoms of cough, shortness of breath, or wheezing Asthma control goals:  Full participation in all desired activities (may need albuterol  before activity) Albuterol  use two times or less a week on average (not counting use with activity) Cough interfering with sleep two times or less a month Oral steroids no more  than once a year No hospitalizations   Snoring - Will refer to Sleep/Pulm for OSA.  Discussed she has significant polypoid sinus disease and that is likely contributing to snoring/dyspnea/congestion.    Return in about 3 months (around 11/03/2024).  Arleta Blanch, MD [facility] 

## 2024-08-03 NOTE — Patient Instructions (Addendum)
 Allergic Rhinitis: Chronic Sinusitis with Nasal Polyps   - Positive skin test 05/2023: trees, grasses, weeds, dogs  - Use nasal saline rinses before nose sprays such as with Neilmed Sinus Rinse.  Use distilled water.   - Use Dysmita 2 sprays each nostril twice daily.  Aim upward and outward.  This replaces Flonase  and Azelastine .  - Use Zyrtec  10 mg daily.  - Use Dupixent  subcutaneous 300mg  every 14 days.  - Consider allergy  shots as long term control of your symptoms by teaching your immune system to be more tolerant of your allergy  triggers   Moderate Persistent Asthma: - Maintenance inhaler: restart Symbicort  160-4.68mcg 2 puffs twice daily with spacer.  - Rescue inhaler: Albuterol  2 puffs via spacer or 1 vial via nebulizer every 4-6 hours as needed for respiratory symptoms of cough, shortness of breath, or wheezing Asthma control goals:  Full participation in all desired activities (may need albuterol  before activity) Albuterol  use two times or less a week on average (not counting use with activity) Cough interfering with sleep two times or less a month Oral steroids no more than once a year No hospitalizations

## 2024-09-22 ENCOUNTER — Other Ambulatory Visit: Payer: Self-pay

## 2024-09-22 ENCOUNTER — Other Ambulatory Visit (HOSPITAL_COMMUNITY): Payer: Self-pay

## 2024-09-22 NOTE — Progress Notes (Signed)
 Patient called to setup refill but PA is still required, Per patient she has been off of medication since October. Reapproval appointment was 11.19.25.

## 2024-09-23 ENCOUNTER — Other Ambulatory Visit (HOSPITAL_COMMUNITY): Payer: Self-pay

## 2024-09-23 ENCOUNTER — Telehealth: Payer: Self-pay

## 2024-09-23 NOTE — Telephone Encounter (Signed)
 PA needed for patients Dupixent , last visit 08/03/2024. Thank you!

## 2024-09-30 ENCOUNTER — Other Ambulatory Visit (HOSPITAL_COMMUNITY): Payer: Self-pay

## 2024-10-04 NOTE — Telephone Encounter (Signed)
 Called patient and l/m to contact me to advise Ins denied her Dupixent  for nasal polyps due to her not being on nasal steroids prior to restart.

## 2024-10-04 NOTE — Telephone Encounter (Signed)
 Advised patient per Ins he has fail nasal steroid x 4 weeks and stay on same during dupixent  therapy

## 2024-10-06 ENCOUNTER — Other Ambulatory Visit: Payer: Self-pay

## 2024-10-06 NOTE — Progress Notes (Signed)
 Patient denied for re-approval due to no usage of nasal steroids consistently prior to usage and continued usage during Dupixent  therapy. Patient has also not been compliant with Dupixent  therapy. Sending office message to let us  know when/if patient is approved again and we can resume filling.

## 2024-10-10 NOTE — Patient Instructions (Incomplete)
 Allergic Rhinitis: Chronic Sinusitis with Nasal Polyps   - Positive skin test 05/2023: trees, grasses, weeds, dogs  - Use nasal saline rinses before nose sprays such as with Neilmed Sinus Rinse.  Use distilled water.   - Use Dysmita 2 sprays each nostril twice daily.  Aim upward and outward.   - Use Zyrtec  10 mg daily.  - Use Dupixent  subcutaneous 300mg  every 14 days.  - Consider allergy  shots as long term control of your symptoms by teaching your immune system to be more tolerant of your allergy  triggers   Moderate Persistent Asthma: - Maintenance inhaler: Symbicort  160-4.53mcg 2 puffs twice daily with spacer.  - Rescue inhaler: Albuterol  2 puffs via spacer or 1 vial via nebulizer every 4-6 hours as needed for respiratory symptoms of cough, shortness of breath, or wheezing Asthma control goals:  Full participation in all desired activities (may need albuterol  before activity) Albuterol  use two times or less a week on average (not counting use with activity) Cough interfering with sleep two times or less a month Oral steroids no more than once a year No hospitalizations

## 2024-10-11 ENCOUNTER — Ambulatory Visit: Payer: MEDICAID | Admitting: Family

## 2024-10-25 ENCOUNTER — Ambulatory Visit: Payer: MEDICAID | Admitting: Family

## 2024-11-01 ENCOUNTER — Ambulatory Visit: Payer: MEDICAID | Admitting: Family
